# Patient Record
Sex: Male | Born: 1940 | Race: White | Hispanic: No | Marital: Married | Smoking: Current every day smoker
Health system: Southern US, Community
[De-identification: ages and names within clinical notes are randomized; demographics above are authoritative.]

## PROBLEM LIST (undated history)

## (undated) DIAGNOSIS — D126 Benign neoplasm of colon, unspecified: Secondary | ICD-10-CM

## (undated) DIAGNOSIS — R0602 Shortness of breath: Secondary | ICD-10-CM

## (undated) DIAGNOSIS — I2510 Atherosclerotic heart disease of native coronary artery without angina pectoris: Secondary | ICD-10-CM

## (undated) DIAGNOSIS — K449 Diaphragmatic hernia without obstruction or gangrene: Secondary | ICD-10-CM

## (undated) DIAGNOSIS — K219 Gastro-esophageal reflux disease without esophagitis: Secondary | ICD-10-CM

## (undated) DIAGNOSIS — G4730 Sleep apnea, unspecified: Secondary | ICD-10-CM

## (undated) DIAGNOSIS — R972 Elevated prostate specific antigen [PSA]: Secondary | ICD-10-CM

## (undated) DIAGNOSIS — I739 Peripheral vascular disease, unspecified: Secondary | ICD-10-CM

## (undated) DIAGNOSIS — Z8711 Personal history of peptic ulcer disease: Secondary | ICD-10-CM

## (undated) DIAGNOSIS — E669 Obesity, unspecified: Secondary | ICD-10-CM

## (undated) DIAGNOSIS — I10 Essential (primary) hypertension: Secondary | ICD-10-CM

## (undated) DIAGNOSIS — J449 Chronic obstructive pulmonary disease, unspecified: Secondary | ICD-10-CM

## (undated) DIAGNOSIS — I499 Cardiac arrhythmia, unspecified: Secondary | ICD-10-CM

## (undated) DIAGNOSIS — N12 Tubulo-interstitial nephritis, not specified as acute or chronic: Secondary | ICD-10-CM

## (undated) DIAGNOSIS — E785 Hyperlipidemia, unspecified: Secondary | ICD-10-CM

## (undated) DIAGNOSIS — J9610 Chronic respiratory failure, unspecified whether with hypoxia or hypercapnia: Secondary | ICD-10-CM

## (undated) DIAGNOSIS — I4891 Unspecified atrial fibrillation: Secondary | ICD-10-CM

## (undated) DIAGNOSIS — F17200 Nicotine dependence, unspecified, uncomplicated: Secondary | ICD-10-CM

## (undated) DIAGNOSIS — I5022 Chronic systolic (congestive) heart failure: Secondary | ICD-10-CM

## (undated) HISTORY — DX: Obesity, unspecified: E66.9

## (undated) HISTORY — PX: APPENDECTOMY: SHX54

## (undated) HISTORY — DX: Essential (primary) hypertension: I10

## (undated) HISTORY — DX: Benign neoplasm of colon, unspecified: D12.6

## (undated) HISTORY — DX: Elevated prostate specific antigen (PSA): R97.20

## (undated) HISTORY — DX: Diaphragmatic hernia without obstruction or gangrene: K44.9

## (undated) HISTORY — DX: Unspecified atrial fibrillation: I48.91

## (undated) HISTORY — PX: CORONARY ANGIOPLASTY: SHX604

## (undated) HISTORY — DX: Sleep apnea, unspecified: G47.30

## (undated) HISTORY — PX: TONSILLECTOMY: SUR1361

## (undated) HISTORY — DX: Nicotine dependence, unspecified, uncomplicated: F17.200

## (undated) HISTORY — DX: Tubulo-interstitial nephritis, not specified as acute or chronic: N12

## (undated) HISTORY — DX: Hyperlipidemia, unspecified: E78.5

## (undated) HISTORY — DX: Personal history of peptic ulcer disease: Z87.11

## (undated) HISTORY — DX: Peripheral vascular disease, unspecified: I73.9

## (undated) HISTORY — DX: Gastro-esophageal reflux disease without esophagitis: K21.9

## (undated) HISTORY — DX: Atherosclerotic heart disease of native coronary artery without angina pectoris: I25.10

## (undated) SURGERY — PARS PLANA VITRECTOMY 25 GAUGE FOR ENDOPHTHALMITIS
Anesthesia: Monitor Anesthesia Care | Laterality: Right

---

## 2001-01-31 HISTORY — PX: OTHER SURGICAL HISTORY: SHX169

## 2001-05-08 ENCOUNTER — Inpatient Hospital Stay (HOSPITAL_COMMUNITY): Admission: RE | Admit: 2001-05-08 | Discharge: 2001-05-11 | Payer: Self-pay | Admitting: Cardiology

## 2001-05-08 ENCOUNTER — Encounter: Payer: Self-pay | Admitting: Cardiology

## 2001-05-22 ENCOUNTER — Encounter (HOSPITAL_COMMUNITY): Admission: RE | Admit: 2001-05-22 | Discharge: 2001-08-20 | Payer: Self-pay | Admitting: Cardiology

## 2001-08-21 ENCOUNTER — Encounter (HOSPITAL_COMMUNITY): Admission: RE | Admit: 2001-08-21 | Discharge: 2001-08-29 | Payer: Self-pay | Admitting: Cardiology

## 2001-12-31 ENCOUNTER — Ambulatory Visit: Admission: RE | Admit: 2001-12-31 | Discharge: 2001-12-31 | Payer: Self-pay | Admitting: Internal Medicine

## 2002-07-02 ENCOUNTER — Ambulatory Visit (HOSPITAL_COMMUNITY): Admission: RE | Admit: 2002-07-02 | Discharge: 2002-07-02 | Payer: Self-pay | Admitting: *Deleted

## 2002-07-02 ENCOUNTER — Encounter (INDEPENDENT_AMBULATORY_CARE_PROVIDER_SITE_OTHER): Payer: Self-pay | Admitting: Specialist

## 2002-09-09 ENCOUNTER — Ambulatory Visit (HOSPITAL_COMMUNITY): Admission: RE | Admit: 2002-09-09 | Discharge: 2002-09-09 | Payer: Self-pay | Admitting: Cardiology

## 2004-11-11 ENCOUNTER — Encounter (HOSPITAL_COMMUNITY): Admission: RE | Admit: 2004-11-11 | Discharge: 2005-02-09 | Payer: Self-pay | Admitting: Cardiology

## 2005-02-10 ENCOUNTER — Encounter (HOSPITAL_COMMUNITY): Admission: RE | Admit: 2005-02-10 | Discharge: 2005-05-11 | Payer: Self-pay | Admitting: Cardiology

## 2008-05-08 ENCOUNTER — Encounter: Payer: Self-pay | Admitting: Pulmonary Disease

## 2009-01-26 ENCOUNTER — Encounter: Payer: Self-pay | Admitting: Pulmonary Disease

## 2009-06-09 ENCOUNTER — Encounter: Payer: Self-pay | Admitting: Pulmonary Disease

## 2009-07-01 DIAGNOSIS — I2510 Atherosclerotic heart disease of native coronary artery without angina pectoris: Secondary | ICD-10-CM

## 2009-07-01 DIAGNOSIS — D126 Benign neoplasm of colon, unspecified: Secondary | ICD-10-CM

## 2009-07-01 DIAGNOSIS — F17200 Nicotine dependence, unspecified, uncomplicated: Secondary | ICD-10-CM

## 2009-07-01 DIAGNOSIS — I10 Essential (primary) hypertension: Secondary | ICD-10-CM | POA: Insufficient documentation

## 2009-07-01 DIAGNOSIS — N12 Tubulo-interstitial nephritis, not specified as acute or chronic: Secondary | ICD-10-CM | POA: Insufficient documentation

## 2009-07-01 DIAGNOSIS — K449 Diaphragmatic hernia without obstruction or gangrene: Secondary | ICD-10-CM | POA: Insufficient documentation

## 2009-07-01 DIAGNOSIS — Z8711 Personal history of peptic ulcer disease: Secondary | ICD-10-CM

## 2009-07-01 DIAGNOSIS — E785 Hyperlipidemia, unspecified: Secondary | ICD-10-CM

## 2009-07-01 DIAGNOSIS — G4733 Obstructive sleep apnea (adult) (pediatric): Secondary | ICD-10-CM | POA: Insufficient documentation

## 2009-07-01 DIAGNOSIS — N529 Male erectile dysfunction, unspecified: Secondary | ICD-10-CM

## 2009-07-01 DIAGNOSIS — R972 Elevated prostate specific antigen [PSA]: Secondary | ICD-10-CM | POA: Insufficient documentation

## 2009-07-01 DIAGNOSIS — E669 Obesity, unspecified: Secondary | ICD-10-CM

## 2009-07-01 DIAGNOSIS — K219 Gastro-esophageal reflux disease without esophagitis: Secondary | ICD-10-CM

## 2009-07-02 ENCOUNTER — Ambulatory Visit: Payer: Self-pay | Admitting: Pulmonary Disease

## 2009-07-02 DIAGNOSIS — J449 Chronic obstructive pulmonary disease, unspecified: Secondary | ICD-10-CM

## 2009-10-21 ENCOUNTER — Encounter: Admission: RE | Admit: 2009-10-21 | Discharge: 2009-10-21 | Payer: Self-pay | Admitting: Cardiovascular Disease

## 2009-10-23 ENCOUNTER — Ambulatory Visit (HOSPITAL_COMMUNITY): Admission: RE | Admit: 2009-10-23 | Discharge: 2009-10-24 | Payer: Self-pay | Admitting: Cardiovascular Disease

## 2009-10-26 ENCOUNTER — Ambulatory Visit: Payer: Self-pay | Admitting: Pulmonary Disease

## 2010-03-04 NOTE — Letter (Signed)
Summary: Romero Liner MD  Romero Liner MD   Imported By: Lester Dufur 07/13/2009 08:32:40  _____________________________________________________________________  External Attachment:    Type:   Image     Comment:   External Document

## 2010-03-04 NOTE — Assessment & Plan Note (Signed)
Summary: rov for emphysema   Copy to:  Dr. Merri Brunette Primary Provider/Referring Provider:  Dr. Merri Brunette  CC:  4 month follow up. Pt states his breathing is better since last visit. Pt c/o productive cough. 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 Pt states he is still smoking 3/4 of a pack. .  History of Present Illness: the pt comes in today for f/u of his known emphysema.  He thinks he has done better on his bronchodilator regimen, but has improved further after his recent PCI for CAD.  Unfortunately, he continues to smoke, but is trying to quit.  He denies any recent acute exacerbation or pulmonary infection.  He is scheduled to get flu shot with primary md next week.  Preventive Screening-Counseling & Management  Alcohol-Tobacco     Smoking Status: current     Smoking Cessation Counseling: yes     Packs/Day: 0.75  Current Medications (verified): 1)  Aspirin 325 Mg Tabs (Aspirin) .... Once Daily 2)  Simvastatin 40 Mg Tabs (Simvastatin) .... Take 1 Tablet By Mouth Once A Day 3)  Protonix 40 Mg Tbec (Pantoprazole Sodium) .... Take 1 Tablet By Mouth Once A Day 4)  Symbicort 80-4.5 Mcg/act Aero (Budesonide-Formoterol Fumarate) .... As Needed 5)  Spiriva Handihaler 18 Mcg  Caps (Tiotropium Bromide Monohydrate) .... Inhale Contents of 1 Capsule Once A Day 6)  Multivitamins  Tabs (Multiple Vitamin) .... Take 1 Tablet By Mouth Once A Day 7)  Fish Oil 1000 Mg Caps (Omega-3 Fatty Acids) .... Three Times A Day 8)  Vitamin D .... Take 1 Tablet By Mouth Once A Day 9)  Citracal .... Daily 10)  Losartan Potassium 100 Mg Tabs (Losartan Potassium) .... Once Daily 11)  Proair Hfa 108 (90 Base) Mcg/act  Aers (Albuterol Sulfate) .... 2 Puffs Every 4-6 Hours As Needed 12)  Bystolic 5 Mg Tabs (Nebivolol Hcl) .... One Tablet Once Daily 13)  Plavix 75 Mg Tabs (Clopidogrel Bisulfate) .... One Tablet Once Daily 14)  Diltiazem Hcl 120 Mg Tabs (Diltiazem Hcl) .... Once Daily 15)   Nitrostat 0.4 Mg Subl (Nitroglycerin) .... As Directed As Needed  Allergies: 1)  ! Zantac 2)  ! Ticlid 3)  ! Sulfa 4)  ! Beta Blockers 5)  ! * Altace 6)  ! * Ranitidine Hcl  Social History: Smoking Status:  current Packs/Day:  0.75  Review of Systems       The patient complains of shortness of breath with activity, productive cough, irregular heartbeats, acid heartburn, and hand/feet swelling.  The patient denies shortness of breath at rest, non-productive cough, coughing up blood, chest pain, loss of appetite, weight change, abdominal pain, difficulty swallowing, sore throat, tooth/dental problems, headaches, nasal congestion/difficulty breathing through nose, sneezing, itching, ear ache, anxiety, depression, joint stiffness or pain, rash, change in color of mucus, and fever.    Vital Signs:  Patient profile:   70 year old male Height:      71 inches O2 Sat:      92 % on Room air Temp:     97.4 degrees F oral Pulse rate:   81 / minute BP sitting:   112 / 62  (left arm) Cuff size:   regular  Vitals Entered By: Carver Fila (October 26, 2009 9:07 AM)  O2 Flow:  Room air CC: 4 month follow up. Pt states his breathing is better since last visit. Pt c/o productive cough. Pt states he was told he has  an aneurysm  on his aorta. Pt states he is still smoking 3/4 of a pack.  Comments meds and allergies updated Phone number updated Carver Fila  October 26, 2009 9:06 AM    Physical Exam  General:  ow male in nad Lungs:  decreased bs, no wheezing or rhonchi Heart:  rrr, no mrg Extremities:  no edema or cyanosis  Neurologic:  alert and oriented, moves all 4.   Impression & Recommendations:  Problem # 1:  EMPHYSEMA (ICD-492.8)  the pt is doing better on his bronchodilator regimen, but unfortunately continues to smoke.  I have encouraged him to work on smoking cessation, and to also work on weight loss and conditioning.  He is to continue with his current bronchodilator  regimen, and will followup with me in 6mos.  The pt states that he is getting his flu shot next week with primary md.  Have given him a prescription for the nicotine patch, and also a flyer for the smoking cessation class given at [facility].  Medications Added to Medication List This Visit: 1)  Aspirin 325 Mg Tabs (Aspirin) .... Once daily 2)  Bystolic 5 Mg Tabs (Nebivolol hcl) .... One tablet once daily 3)  Plavix 75 Mg Tabs (Clopidogrel bisulfate) .... One tablet once daily 4)  Diltiazem Hcl 120 Mg Tabs (Diltiazem hcl) .... Once daily 5)  Nitrostat 0.4 Mg Subl (Nitroglycerin) .... As directed as needed 6)  Nicotine 14 Mg/24hr Pt24 (Nicotine) .... One locally each day  Other Orders: Est. Patient Level III (16109) Tobacco use cessation intermediate 3-10 minutes (60454)  Patient Instructions: 1)  no change in meds 2)  work on exercise program and weight loss 3)  stop smoking 4)  followup with me in 6mos.  Prescriptions: NICOTINE 14 MG/24HR PT24 (NICOTINE) one locally each day  #one box x 6   Entered and Authorized by:   Barbaraann Share MD   Signed by:   Barbaraann Share MD on 10/26/2009   Method used:   Print then Give to Patient   RxID:   [id number]    Immunization History:  Influenza Immunization History:    Influenza:  historical (10/31/2008)

## 2010-03-04 NOTE — Assessment & Plan Note (Signed)
Summary: consult for emphysema   Copy to:  Dr. Merri Brunette Primary Provider/Referring Provider:  Dr. Merri Brunette  CC:  PUlmonary Consult for COPD.Marland Kitchen  History of Present Illness: The pt is a 70y/o male who I have been asked to see for emphysema.  He has a longstanding h/o smoking, and continues to do so at a pack a day.  He has had spirometry in April of last year which revealed an FEV1% of 36, and FEV1 of 0.95 (27% pred).  He did have a 22% improvement in FEV1 with bronchodilators at the time.  The pt has been treated with spiriva and symbicort, and thinks they have helped his breathing.  He has had breathing issues for at least 6 yrs, and feels they are getting worse.  He describes a one block doe at a moderate pace on flat ground, and will get winded bringing groceries in from the car.  He has a congested cough at times, with varying severity.  He typically produces white to clear mucus.  He does have a h/o coronary artery disease, with a stent placed in 2003.  He apparently had a stress test last year, and tells me it was "ok".  He denies any siginficant LE edema.  Current Medications (verified): 1)  Bisoprolol Fumarate 5 Mg Tabs (Bisoprolol Fumarate) .... Take 1 Tablet By Mouth Once A Day 2)  Aspirin Low Dose 81 Mg Tabs (Aspirin) .... Take 1 Tablet By Mouth Once A Day 3)  Simvastatin 40 Mg Tabs (Simvastatin) .... Take 1 Tablet By Mouth Once A Day 4)  Protonix 40 Mg Tbec (Pantoprazole Sodium) .... Take 1 Tablet By Mouth Once A Day 5)  Symbicort 80-4.5 Mcg/act Aero (Budesonide-Formoterol Fumarate) .... As Needed 6)  Spiriva Handihaler 18 Mcg  Caps (Tiotropium Bromide Monohydrate) .... Inhale Contents of 1 Capsule Once A Day 7)  Combivent 18-103 Mcg/act Aero (Ipratropium-Albuterol) .... As Needed 8)  Multivitamins  Tabs (Multiple Vitamin) .... Take 1 Tablet By Mouth Once A Day 9)  Fish Oil 1000 Mg Caps (Omega-3 Fatty Acids) .... Three Times A Day 10)  Vitamin D .... Take 1 Tablet By Mouth Once  A Day 11)  Citracal .... Daily 12)  Losartan Potassium 100 Mg Tabs (Losartan Potassium) .... Once Daily  Allergies (verified): 1)  ! Zantac 2)  ! Ticlid 3)  ! Sulfa 4)  ! Beta Blockers 5)  ! 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 heart disease: mother (heart failure d/t rheumatic heart disease) father ( CAD) emphysema-father  Social History: Reviewed history from 07/01/2009 and no changes required. Pt is retired.  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 1ppd x 60yrs couple drinks a day married 2 children  Review of Systems       The patient complains of shortness of breath with activity, shortness of breath at rest, productive cough, irregular heartbeats, and tooth/dental problems.  The patient denies non-productive cough, coughing up blood, chest pain, acid heartburn, indigestion, loss of appetite, weight change, abdominal pain, difficulty swallowing, sore throat, headaches, nasal congestion/difficulty breathing through nose, sneezing, itching, ear ache,  anxiety, depression, hand/feet swelling, joint stiffness or pain, rash, change in color of mucus, and fever.    Vital Signs:  Patient profile:   70 year old male Height:      71 inches Weight:      221 pounds BMI:     30.93 O2 Sat:      95 % on Room air Temp:     97.7 degrees F oral Pulse rate:   74 / minute BP sitting:   120 / 66  (left arm) Cuff size:    regular  Vitals Entered By: Gweneth Dimitri RN (July 02, 2009 2:21 PM)  O2 Flow:  Room air CC: PUlmonary Consult for COPD. Comments Medications reviewed with patient Daytime contact number verified with patient. Gweneth Dimitri RN  July 02, 2009 2:21 PM    Physical Exam  General:  ow male in nad Eyes:  PERRLA and EOMI.   Nose:  deviated septum to left with significant narrowing Mouth:  clear Neck:  no jvd, tmg, LN Lungs:  decreased bs throughout, no wheezing or rhonchi Heart:  rrr, no mrg Abdomen:  soft and nontender, bs + Extremities:  mild edema in ankles, no cyanosis varicosities present pulses intact distally. Neurologic:  alert and oriented, moves all 4.   Impression & Recommendations:  Problem # 1:  EMPHYSEMA (ICD-492.8)  the pt has severe airflow obstruction by his spirometry last year, and unfortunately continues to smoke.  I think he does have fairly severe emphysema, and have told him that it is crucial that he stop smoking before he reaches a critical FEV1 where he becomes nonfunctional.  I suspect he is close.  He is on an excellent bronchodilator regimen, and vaccines are up to date.  He is not quite at the point of needing oxygen, but may be nearing that point.  I have discussed the role of weight loss and some type of conditioning program such as pulmonary rehab.  He will consider this vs. a program closer to his home.  Ultimately, his fate is in his own hands, and depends on smoking cessation.  He will continue to have cough and excess mucus production until he stops smoking.  Medications Added to Medication List This Visit: 1)  Symbicort 80-4.5 Mcg/act Aero (Budesonide-formoterol fumarate) .... As needed 2)  Combivent 18-103 Mcg/act Aero (Ipratropium-albuterol) .... As needed 3)  Fish Oil 1000 Mg Caps (Omega-3 fatty acids) .... Three times a day 4)  Losartan Potassium 100 Mg Tabs (Losartan potassium) .... Once daily 5)  Proair Hfa 108 (90 Base) Mcg/act Aers  (Albuterol sulfate) .... 2 puffs every 4-6 hours as needed  Other Orders: Tobacco use cessation intermediate 3-10 minutes (91478) Consultation Level IV (29562)  Patient Instructions: 1)  stop smoking!!  this is the most important thing to treat your lung problem. 2)  work on weight loss, conditioning program.  Please call if you wish to be referred to pulmonary rehab program. 3)  continue with symbicort and spiriva. 4)  change combivent to albuterol 2 puffs every 4-6 hours only if needed for emergencies. 5)  followup with me in 4mos so we can check on your progress.   Prescriptions: PROAIR HFA 108 (90 BASE) MCG/ACT  AERS (ALBUTEROL SULFATE) 2 puffs every 4-6 hours as needed  #1 x 6   Entered and Authorized by:   Barbaraann Share MD   Signed by:   Barbaraann Share MD on 07/02/2009   Method used:   Electronically to  [facility] [address] [id number] (retail)       [address]       [place], [place]  [address]       Ph: [phone]       Fax: [phone]   RxID:   [id number]

## 2010-03-27 ENCOUNTER — Encounter (HOSPITAL_COMMUNITY): Payer: Self-pay | Admitting: Radiology

## 2010-03-27 ENCOUNTER — Other Ambulatory Visit: Payer: Self-pay | Admitting: Surgery

## 2010-03-27 ENCOUNTER — Inpatient Hospital Stay (HOSPITAL_COMMUNITY)
Admission: EM | Admit: 2010-03-27 | Discharge: 2010-03-29 | DRG: 340 | Disposition: A | Discharge status: Home / Self Care | Payer: Medicare Other | Attending: Surgery | Admitting: Surgery

## 2010-03-27 ENCOUNTER — Emergency Department (HOSPITAL_COMMUNITY): Payer: Medicare Other

## 2010-03-27 DIAGNOSIS — Z9861 Coronary angioplasty status: Secondary | ICD-10-CM

## 2010-03-27 DIAGNOSIS — Z79899 Other long term (current) drug therapy: Secondary | ICD-10-CM

## 2010-03-27 DIAGNOSIS — Z7902 Long term (current) use of antithrombotics/antiplatelets: Secondary | ICD-10-CM

## 2010-03-27 DIAGNOSIS — K35209 Acute appendicitis with generalized peritonitis, without abscess, unspecified as to perforation: Principal | ICD-10-CM | POA: Diagnosis present

## 2010-03-27 DIAGNOSIS — I2510 Atherosclerotic heart disease of native coronary artery without angina pectoris: Secondary | ICD-10-CM | POA: Diagnosis present

## 2010-03-27 DIAGNOSIS — J449 Chronic obstructive pulmonary disease, unspecified: Secondary | ICD-10-CM | POA: Diagnosis present

## 2010-03-27 DIAGNOSIS — Z7982 Long term (current) use of aspirin: Secondary | ICD-10-CM

## 2010-03-27 DIAGNOSIS — K352 Acute appendicitis with generalized peritonitis, without abscess: Principal | ICD-10-CM | POA: Diagnosis present

## 2010-03-27 DIAGNOSIS — E669 Obesity, unspecified: Secondary | ICD-10-CM | POA: Diagnosis present

## 2010-03-27 DIAGNOSIS — G4733 Obstructive sleep apnea (adult) (pediatric): Secondary | ICD-10-CM | POA: Diagnosis present

## 2010-03-27 DIAGNOSIS — I10 Essential (primary) hypertension: Secondary | ICD-10-CM | POA: Diagnosis present

## 2010-03-27 DIAGNOSIS — J4489 Other specified chronic obstructive pulmonary disease: Secondary | ICD-10-CM | POA: Diagnosis present

## 2010-03-27 DIAGNOSIS — F17200 Nicotine dependence, unspecified, uncomplicated: Secondary | ICD-10-CM | POA: Diagnosis present

## 2010-03-27 HISTORY — DX: Nicotine dependence, unspecified, uncomplicated: F17.200

## 2010-03-27 HISTORY — DX: Chronic obstructive pulmonary disease, unspecified: J44.9

## 2010-03-27 LAB — COMPREHENSIVE METABOLIC PANEL
ALT: 16 U/L (ref 0–53)
Alkaline Phosphatase: 60 U/L (ref 39–117)
CO2: 30 mEq/L (ref 19–32)
Chloride: 101 mEq/L (ref 96–112)
GFR calc non Af Amer: 45 mL/min — ABNORMAL LOW (ref >60)
Glucose, Bld: 140 mg/dL — ABNORMAL HIGH (ref 70–99)
Potassium: 4.9 mEq/L (ref 3.5–5.1)
Sodium: 139 mEq/L (ref 135–145)

## 2010-03-27 LAB — CBC
HCT: 44.3 % (ref 39.0–52.0)
Hemoglobin: 14.9 g/dL (ref 13.0–17.0)
MCH: 29.1 pg (ref 26.0–34.0)
MCV: 86.5 fL (ref 78.0–100.0)
RBC: 5.12 MIL/uL (ref 4.22–5.81)

## 2010-03-27 LAB — DIFFERENTIAL
Basophils Absolute: 0 10*3/uL (ref 0.0–0.1)
Basophils Relative: 0 % (ref 0–1)
Lymphocytes Relative: 6 % — ABNORMAL LOW (ref 12–46)
Lymphs Abs: 0.9 10*3/uL (ref 0.7–4.0)
Monocytes Absolute: 1.1 10*3/uL — ABNORMAL HIGH (ref 0.1–1.0)
Monocytes Relative: 7 % (ref 3–12)
Neutro Abs: 13.8 10*3/uL — ABNORMAL HIGH (ref 1.7–7.7)
Neutrophils Relative %: 87 % — ABNORMAL HIGH (ref 43–77)

## 2010-03-27 LAB — TYPE AND SCREEN: ABO/RH(D): O POS

## 2010-03-27 MED ORDER — IOHEXOL 300 MG/ML  SOLN
100.0000 mL | Freq: Once | INTRAMUSCULAR | Status: AC | PRN
  Administered 2010-03-27: 100 mL via INTRAVENOUS

## 2010-03-28 LAB — BASIC METABOLIC PANEL
BUN: 18 mg/dL (ref 6–23)
CO2: 28 mEq/L (ref 19–32)
Calcium: 8.6 mg/dL (ref 8.4–10.5)
GFR calc non Af Amer: 39 mL/min — ABNORMAL LOW (ref >60)
Glucose, Bld: 160 mg/dL — ABNORMAL HIGH (ref 70–99)
Potassium: 4.6 mEq/L (ref 3.5–5.1)

## 2010-03-28 LAB — CBC
HCT: 44.3 % (ref 39.0–52.0)
Hemoglobin: 14.2 g/dL (ref 13.0–17.0)
MCHC: 32.1 g/dL (ref 30.0–36.0)
MCV: 88.4 fL (ref 78.0–100.0)
RDW: 14.8 % (ref 11.5–15.5)

## 2010-03-29 LAB — BASIC METABOLIC PANEL
BUN: 23 mg/dL (ref 6–23)
Chloride: 102 mEq/L (ref 96–112)
Creatinine, Ser: 1.72 mg/dL — ABNORMAL HIGH (ref 0.4–1.5)
Glucose, Bld: 98 mg/dL (ref 70–99)

## 2010-03-29 LAB — CBC
MCHC: 31.8 g/dL (ref 30.0–36.0)
MCV: 86.8 fL (ref 78.0–100.0)
Platelets: 123 10*3/uL — ABNORMAL LOW (ref 150–400)
RDW: 14.5 % (ref 11.5–15.5)
WBC: 10.7 10*3/uL — ABNORMAL HIGH (ref 4.0–10.5)

## 2010-03-30 NOTE — H&P (Signed)
[name], [name]                ACCOUNT NO.:  [id number]  MEDICAL RECORD NO.:  [id number]           PATIENT TYPE:  I  LOCATION:  3706                         FACILITY:  [facility]  PHYSICIAN:  Abigail Miyamoto, M.D. DATE OF BIRTH:  [birth year]  DATE OF ADMISSION:  03/27/2010 DATE OF DISCHARGE:                             HISTORY & PHYSICAL   CHIEF COMPLAINTS:  Right side abdominal pain.  HISTORY:  This is a 70 year old gentleman who presents with a 24-hour history of right-sided and mid sharp abdominal pain.  He reports the pain is now continuous, it began gradually but now it is rated as an 8- 9/10.  He has had nausea with this but no emesis.  He has had no similar history of abdominal pain.  His pain did not refer anywhere else. Again, it is sharp consistently.  PAST MEDICAL HISTORY: 1. Coronary artery disease status post heart catheterization and     stents. 2. COPD. 3. Hypertension. 4. Sleep apnea. 5. Tobacco abuse.  FAMILY HISTORY:  Noncontributory.  PAST SURGICAL HISTORY:  Tonsillectomy and above-mentioned heart catheterization.  MEDICATIONS:  Pantoprazole, losartan, isosorbide, Plavix, Bystolic, nitroglycerin, diltiazem, aspirin, Symbicort, Spiriva, Crestor, and recent Cipro.  SOCIAL HISTORY:  He smokes a pack of cigarettes a day.  He drinks alcohol on occasion.  REVIEW OF SYSTEMS:  GENERAL:  Negative for fever or chills.  PULMONARY: Negative for cough, shortness of breath, or difficulty breathing. CARDIAC:  Negative for chest pain or irregular heartbeat.  ABDOMEN: Listed as above.  Bowel movements have been normal.  URINARY:  Negative for dysuria, hematuria. The rest of review of systems including skin, eyes, ear, nose, and throat, musculoskeletal, neurologic, psychiatric, endocrine are normal.  ALLERGIES:  SULFA and ZANTAC.  PHYSICAL EXAMINATION:  GENERAL:  This is a mildly obese gentleman, in no acute distress. VITAL SIGNS:  Temperature 98.8, heart rate  76, blood pressure is 123/55, respiratory rate 22, satting 100% on room air. HEENT:  Eyes:  Anicteric.  Pupils reactive bilaterally.  ENT:  External ears and nose normal.  Hearing is normal.  Oropharynx clear. NECK:  Supple.  There is no cervical adenopathy.  There is no thyromegaly. LUNGS:  Clear to auscultation bilaterally with normal respiratory effort. CARDIOVASCULAR:  Regular rate and rhythm.  There are no murmurs.  There is no peripheral edema. ABDOMEN:  Soft.  There is tenderness with guarding in the mid to right upper quadrant and periumbilical area, tenderness is moderate.  There are no masses.  There is no hernias.  There is no organomegaly. EXTREMITIES:  Warm, well perfused.  No edema, clubbing, or cyanosis. Peripheral pulses are intact in all four extremities. MUSCULOSKELETAL:  Normal motor function and strength in all four extremities. NEUROLOGIC:  Shows him to have gross motor and sensory function in all four extremities. SKIN:  No rashes and no jaundice. PSYCHIATRIC:  Shows him to be awake, alert, and oriented.  Judgment, affect appeared normal.  LABORATORY DATA:  The patient's CBC showed a white blood count of 15.8, hemoglobin of 14.9, platelets of 153, creatinine is 1.55.  The patient had a CAT scan of the abdomen  and pelvis that shows dilated appendix with periappendiceal stranding and a possible periappendiceal abscess that is near the right upper quadrant with the cecum in the right upper quadrant as well.  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  IMPRESSION:  This is a patient with acute appendicitis and a history of coronary artery disease.  At this point, we will proceed to the operating room for a laparoscopic possible open appendectomy.  He understands the risks of surgery, which include but not limited to, bleeding, infection, injury to surrounding structures, appendiceal stump leak, need for further surgery, ongoing infection  and cardiac and pulmonary issues given his cardiac history and smoking.  He understands and wishes to proceed.  Surgery is thus scheduled urgently.     Abigail Miyamoto, M.D.     DB/MEDQ  D:  03/27/2010  T:  03/27/2010  Job:  [id number]  Electronically Signed by Abigail Miyamoto M.D. on 03/30/2010 03:10:40 PM

## 2010-03-30 NOTE — Op Note (Signed)
NAMEHOBSON, LAX                ACCOUNT NO.:  [id number]  MEDICAL RECORD NO.:  [id number]           PATIENT TYPE:  I  LOCATION:  3706                         FACILITY:  [facility]  PHYSICIAN:  Abigail Miyamoto, M.D. DATE OF BIRTH:  August 20, 1940  DATE OF PROCEDURE:  03/27/2010 DATE OF DISCHARGE:                              OPERATIVE REPORT   PREOPERATIVE DIAGNOSIS:  Acute appendicitis.  POSTOPERATIVE DIAGNOSIS:  Perforated appendicitis.  PROCEDURE:  Laparoscopic appendectomy.  SURGEON:  Abigail Miyamoto, MD  ANESTHESIA:  General and 0.25% Marcaine with epinephrine.  ESTIMATED BLOOD LOSS:  Minimal.  INDICATIONS:  [name] is a 70 year old gentleman who presents with right-sided abdominal pain.  He was found on CAT scan to have findings consistent with acute appendicitis.  The decision was made to proceed to the operating room.  FINDINGS:  The patient was found to have acute appendicitis with perforation near the mid section of the appendix.  PROCEDURE IN DETAIL:  The patient was brought to the operating room, identified as [name].  He was placed supine on the operating room table and general anesthesia was induced.  His abdomen was then prepped and draped in usual sterile fashion.  Using a #15 blade, a small vertical incision was made above the umbilicus.  This was carried down to the fascia which was then opened with a scalpel.  A hemostat was then used to pass to the peritoneal cavity under direct vision.  Next, a 0 Vicryl pursestring suture was placed around the fascial opening.  The Hasson port was placed through the opening and insufflation of the abdomen was begun.  The patient's cecum was found to be in the right mid quadrant.  I placed a 5-mm port in the patient's right upper quadrant and another in the left lower quadrant under direct vision.  The patient had a moderate amount of turbid fluid.  The appendix was quite stuck underneath the terminal ileum.  I  was able to finally elevate it, and there was an area of perforation of the midportion of the appendix.  I was able to identify the base of the appendix.  I had to change the 5-mm port in the right side of the abdomen to a 12-mm port under direct vision in order to facilitate the stapling device.  I was then able to transect the base of the appendix.  I then took down the mesoappendix with the Harmonic scalpel.  Once the appendage was completely removed, I placed it in an endosac and removed the incision at the umbilicus.  The patient had a large amount of inflammatory process underneath the medial portion of the cecum below the terminal ileum where the appendix had bee,  I thoroughly irrigated this area with normal saline.  Hemostasis appeared to be achieved.  I did place a piece of Surgicel material in this area as the patient was on Plavix and aspirin.  Hemostasis, however, appeared to be achieved.  At this point, I irrigated the abdomen with 2 L of normal saline.  I then made a separate skin incision in the patient's right lower quadrant and brought a  5-mm port through here.  I then placed a 19-French Blake drain through the 12-mm port and pulled it out of the right lower quadrant port site.  I placed the drain in the area of the cecum and terminal ileum.  The drain was sewn in place with a nylon suture.  Hemostasis again appeared to be achieved. All ports were removed under direct vision, and the abdomen was deflated.  All incisions were then anesthetized with Marcaine.  I closed the fascia at the 12-mm port site with a figure-of-eight 0 Vicryl suture as well.  All skin incisions were then closed with 4-0 Monocryl.  Steri- Strips and Band-Aids were then applied.  The patient tolerated the procedure well.  All counts were correct at the end of the procedure. The patient was then extubated in the operating room and taken in stable condition to recovery room.     Abigail Miyamoto,  M.D.     DB/MEDQ  D:  03/27/2010  T:  03/27/2010  Job:  [id number]  Electronically Signed by Abigail Miyamoto M.D. on 03/30/2010 03:10:42 PM

## 2010-04-02 ENCOUNTER — Emergency Department (HOSPITAL_COMMUNITY): Payer: Medicare Other

## 2010-04-02 ENCOUNTER — Inpatient Hospital Stay (HOSPITAL_COMMUNITY)
Admission: EM | Admit: 2010-04-02 | Discharge: 2010-04-06 | DRG: 394 | Disposition: A | Discharge status: Home / Self Care | Payer: Medicare Other | Attending: Surgery | Admitting: Surgery

## 2010-04-02 DIAGNOSIS — J4489 Other specified chronic obstructive pulmonary disease: Secondary | ICD-10-CM | POA: Diagnosis present

## 2010-04-02 DIAGNOSIS — Z79899 Other long term (current) drug therapy: Secondary | ICD-10-CM

## 2010-04-02 DIAGNOSIS — Y836 Removal of other organ (partial) (total) as the cause of abnormal reaction of the patient, or of later complication, without mention of misadventure at the time of the procedure: Secondary | ICD-10-CM | POA: Diagnosis present

## 2010-04-02 DIAGNOSIS — I10 Essential (primary) hypertension: Secondary | ICD-10-CM | POA: Diagnosis present

## 2010-04-02 DIAGNOSIS — Z7902 Long term (current) use of antithrombotics/antiplatelets: Secondary | ICD-10-CM

## 2010-04-02 DIAGNOSIS — N289 Disorder of kidney and ureter, unspecified: Secondary | ICD-10-CM | POA: Diagnosis present

## 2010-04-02 DIAGNOSIS — J449 Chronic obstructive pulmonary disease, unspecified: Secondary | ICD-10-CM | POA: Diagnosis present

## 2010-04-02 DIAGNOSIS — K56609 Unspecified intestinal obstruction, unspecified as to partial versus complete obstruction: Secondary | ICD-10-CM | POA: Diagnosis present

## 2010-04-02 DIAGNOSIS — Z7982 Long term (current) use of aspirin: Secondary | ICD-10-CM

## 2010-04-02 DIAGNOSIS — E860 Dehydration: Secondary | ICD-10-CM | POA: Diagnosis present

## 2010-04-02 DIAGNOSIS — E669 Obesity, unspecified: Secondary | ICD-10-CM | POA: Diagnosis present

## 2010-04-02 DIAGNOSIS — G4733 Obstructive sleep apnea (adult) (pediatric): Secondary | ICD-10-CM | POA: Diagnosis present

## 2010-04-02 DIAGNOSIS — K929 Disease of digestive system, unspecified: Principal | ICD-10-CM | POA: Diagnosis present

## 2010-04-02 DIAGNOSIS — I2510 Atherosclerotic heart disease of native coronary artery without angina pectoris: Secondary | ICD-10-CM | POA: Diagnosis present

## 2010-04-02 DIAGNOSIS — Z9861 Coronary angioplasty status: Secondary | ICD-10-CM

## 2010-04-02 LAB — COMPREHENSIVE METABOLIC PANEL
ALT: 11 U/L (ref 0–53)
AST: 22 U/L (ref 0–37)
Calcium: 9.5 mg/dL (ref 8.4–10.5)
Creatinine, Ser: 1.81 mg/dL — ABNORMAL HIGH (ref 0.4–1.5)
GFR calc Af Amer: 45 mL/min — ABNORMAL LOW (ref >60)
Sodium: 136 mEq/L (ref 135–145)
Total Protein: 6.9 g/dL (ref 6.0–8.3)

## 2010-04-02 LAB — URINALYSIS, ROUTINE W REFLEX MICROSCOPIC
Ketones, ur: 15 mg/dL — AB
Leukocytes, UA: NEGATIVE
Protein, ur: 100 mg/dL — AB
Urine Glucose, Fasting: NEGATIVE mg/dL

## 2010-04-02 LAB — DIFFERENTIAL
Basophils Absolute: 0.1 10*3/uL (ref 0.0–0.1)
Eosinophils Absolute: 0.1 10*3/uL (ref 0.0–0.7)
Lymphs Abs: 1.8 10*3/uL (ref 0.7–4.0)
Monocytes Absolute: 1.6 10*3/uL — ABNORMAL HIGH (ref 0.1–1.0)
Monocytes Relative: 11 % (ref 3–12)
Neutro Abs: 11.1 10*3/uL — ABNORMAL HIGH (ref 1.7–7.7)

## 2010-04-02 LAB — CBC
MCH: 28.4 pg (ref 26.0–34.0)
MCHC: 34.2 g/dL (ref 30.0–36.0)
Platelets: 267 10*3/uL (ref 150–400)
RDW: 14.3 % (ref 11.5–15.5)

## 2010-04-02 LAB — LIPASE, BLOOD: Lipase: 34 U/L (ref 11–59)

## 2010-04-02 LAB — URINE MICROSCOPIC-ADD ON

## 2010-04-02 MED ORDER — IOHEXOL 300 MG/ML  SOLN
80.0000 mL | Freq: Once | INTRAMUSCULAR | Status: AC | PRN
  Administered 2010-04-02: 80 mL via INTRAVENOUS

## 2010-04-02 NOTE — Discharge Summary (Signed)
NAMECAIRO, [name]                ACCOUNT NO.:  [id number]  MEDICAL RECORD NO.:  [id number]           PATIENT TYPE:  I  LOCATION:  3706                         FACILITY:  [facility]  PHYSICIAN:  Angelia Mould. Derrell Lolling, M.D.DATE OF BIRTH:  23-Jun-1940  DATE OF ADMISSION:  03/27/2010 DATE OF DISCHARGE:  03/29/2010                              DISCHARGE SUMMARY   CONSULTANTS:  None.  PROCEDURES:  Laparoscopic appendectomy with placement of JP drain by Dr. Magnus Ivan on March 27, 2010, this is Dr. Carman Ching.  REASON FOR ADMISSION:  [name] is a 70 year old male who presents to the emergency department with a 24-hour history of right-sided abdominal pain.  It has been progressively worsening over the last 24 hours.  Upon arrival, a CT scan was obtained, which revealed a dilated appendix with periappendiceal stranding and a possible periappendiceal abscess. Please see admitting history and physical for further details.  ADMITTING DIAGNOSES: 1. Acute appendicitis. 2. Coronary artery disease. 3. Chronic obstructive pulmonary disease. 4. Hypertension. 5. Obstructive sleep apnea.  HOSPITAL COURSE:  At this time, the patient was admitted and placed on IV Zosyn.  The patient was then taken to the operating room where he was found to have a perforated appendix.  This was able to be resected laparoscopically; however, due to the amount of contamination, a JP drain was left as well as the fact that the patient was on Plavix. Postoperatively, the patient did well.  On postoperative day #1 he was given clears, which he was tolerating well.  The following day his diet was advanced and he was tolerating a heart-healthy diet by postoperative day #2.  His abdomen was soft, appropriately tender with active bowel sounds.  His JP drain was serosanguineous in nature, but did have a slightly cloudy-looking appearance.  At this time, otherwise the patient was tolerating regular diet and felt stable for  discharge home with his JP drain.  DISCHARGE DIAGNOSES: 1. Perforated appendix status post laparoscopic appendectomy. 2. Coronary artery disease. 3. Hypertension. 4. Chronic obstructive pulmonary disease. 5. Obstructive sleep apnea.  DISCHARGE MEDICATIONS:  Please see medication reconciliation form.  DISCHARGE INSTRUCTIONS:  The patient may increase his activity slowly and walk up steps.  He may shower, however, he is not to bathe while his drain is in place for 2 weeks.  He is not to do any heavy lifting greater than 15 pounds for the next 2 weeks.  He is not to drive for the next 1 week.  He may resume a low-sodium heart-healthy diet.  He is to empty his JP drain as needed and record output and bring that with him to his followup doctor's appointment.  He is to call our office for fever greater than 101.5 or worsening abdominal pain.  We have recommended a stool softener and short-term MiraLax.  He is return to see Dr. Magnus Ivan in our office next week for drain removal.  I have called this appointment up; however, our computer system is down and it has been informed to tell the patient to call our office this afternoon for that appointment time.  He also needs to  follow up with Dr. Renne Crigler within the next 1-2 weeks for post hospital followup.  He did apparently have an appointment Dr. Retta Diones this morning that had to be cancelled due to his hospitalization and I have informed him he will need to reschedule that as well.     Letha Cape, PA   ______________________________ Angelia Mould. Derrell Lolling, M.D.    KEO/MEDQ  D:  03/29/2010  T:  03/29/2010  Job:  [id number]  cc:   Abigail Miyamoto, M.D. Soyla Murphy. Renne Crigler, M.D. Bertram Millard. Dahlstedt, M.D. Nicki Guadalajara, M.D.  Electronically Signed by Barnetta Chapel PA on 03/31/2010 02:06:19 PM Electronically Signed by Claud Kelp M.D. on 04/02/2010 09:34:24 AM

## 2010-04-03 ENCOUNTER — Inpatient Hospital Stay (HOSPITAL_COMMUNITY): Payer: Medicare Other

## 2010-04-03 LAB — CBC
MCV: 85.9 fL (ref 78.0–100.0)
Platelets: 232 10*3/uL (ref 150–400)
RBC: 4.9 MIL/uL (ref 4.22–5.81)
WBC: 11.8 10*3/uL — ABNORMAL HIGH (ref 4.0–10.5)

## 2010-04-03 LAB — COMPREHENSIVE METABOLIC PANEL
ALT: 12 U/L (ref 0–53)
AST: 15 U/L (ref 0–37)
Albumin: 2.4 g/dL — ABNORMAL LOW (ref 3.5–5.2)
Alkaline Phosphatase: 46 U/L (ref 39–117)
BUN: 22 mg/dL (ref 6–23)
Chloride: 100 mEq/L (ref 96–112)
GFR calc Af Amer: 52 mL/min — ABNORMAL LOW (ref >60)
Potassium: 3.9 mEq/L (ref 3.5–5.1)
Sodium: 142 mEq/L (ref 135–145)
Total Bilirubin: 0.5 mg/dL (ref 0.3–1.2)

## 2010-04-04 ENCOUNTER — Inpatient Hospital Stay (HOSPITAL_COMMUNITY): Payer: Medicare Other

## 2010-04-04 LAB — CBC
HCT: 41.4 % (ref 39.0–52.0)
MCH: 27.4 pg (ref 26.0–34.0)
MCHC: 31.9 g/dL (ref 30.0–36.0)
MCV: 86.1 fL (ref 78.0–100.0)
RDW: 14.4 % (ref 11.5–15.5)

## 2010-04-04 LAB — BASIC METABOLIC PANEL
BUN: 16 mg/dL (ref 6–23)
Calcium: 8.4 mg/dL (ref 8.4–10.5)
Creatinine, Ser: 1.57 mg/dL — ABNORMAL HIGH (ref 0.4–1.5)
GFR calc non Af Amer: 44 mL/min — ABNORMAL LOW (ref >60)
Glucose, Bld: 110 mg/dL — ABNORMAL HIGH (ref 70–99)

## 2010-04-06 LAB — CBC
MCHC: 31.7 g/dL (ref 30.0–36.0)
RDW: 14.4 % (ref 11.5–15.5)

## 2010-04-06 LAB — BASIC METABOLIC PANEL
Calcium: 8.1 mg/dL — ABNORMAL LOW (ref 8.4–10.5)
Creatinine, Ser: 1.37 mg/dL (ref 0.4–1.5)
GFR calc Af Amer: >60 mL/min (ref >60)
GFR calc non Af Amer: 52 mL/min — ABNORMAL LOW (ref >60)
Sodium: 139 mEq/L (ref 135–145)

## 2010-04-09 NOTE — Discharge Summary (Signed)
[name], [name]                ACCOUNT NO.:  [id number]  MEDICAL RECORD NO.:  [id number]           PATIENT TYPE:  I  LOCATION:  5125                         FACILITY:  [facility]  PHYSICIAN:  Currie Paris, M.D.DATE OF BIRTH:  Jan 22, 1941  DATE OF ADMISSION:  04/02/2010 DATE OF DISCHARGE:  04/06/2010                              DISCHARGE SUMMARY   ADMITTING PHYSICIAN:  Angelia Mould. Derrell Lolling, MD  DISCHARGING PHYSICIAN:  Currie Paris, MD  PRIMARY SURGEON:  Abigail Miyamoto, MD  REASON FOR ADMISSION:  Mr. [name] is a 70 year old male who was admitted for perforated appendicitis.  He underwent a laparoscopic appendectomy by Dr. Magnus Ivan on March 27, 2010.  A drain was placed after copious irrigation due to concerns for possible postoperative abscess. Postoperatively, the patient did well.  He was later discharged home with the drain.  However, the patient returned on day of admission with minimal flatus and nausea and vomiting as well as abdominal distention. Upon arrival, he was found to have what appeared to be a small bowel obstruction or postoperative ileus.  At this time, we are asked to admit the patient for further evaluation.  ADMITTING DIAGNOSES: 1. Postoperative ileus status post laparoscopic appendectomy. 2. Acute renal insufficiency secondary to dehydration. 3. Dehydration secondary to nausea and vomiting. 4. History of coronary artery disease. 5. COPD.  HOSPITAL COURSE:  At this time, the patient was admitted.  He was started on IV fluids as well as IV Zosyn.  He was made n.p.o. and put on bowel rest.  He did continue to have nausea and emesis, and an NG tube was placed.  Approximately, 1500 mL of bilious output was returned. After this was done, the patient felt much better.  It was felt that at this time the patient had a partial small bowel obstruction.  Over the next day or so, the NG tube was continued.  It was felt that eventually his JP drain was  putting out only serous drainage and could be discontinued.  Once his drain was removed, the patient began having multiple bowel movements and decreased NG tube output.  His NG tube was eventually able to be discontinued on hospital day 3.  After this, his diet was advanced as tolerated.  By the day of discharge, he was tolerating regular diet with no pain and no complaints.  His abdomen was soft, nontender, nondistended with active bowel sounds.  DISCHARGE DIAGNOSES: 1. Status post laparoscopic appendectomy for perforated appendicitis. 2. Partial small bowel obstruction, resolved. 3. Coronary artery disease. 4. Chronic obstructive pulmonary disease.  DISCHARGE MEDICATIONS:  Please see medication reconciliation form.  DISCHARGE INSTRUCTIONS:  The patient will follow up with [place] in 2 weeks for postoperative visit; otherwise, he is encouraged at this time to increase his activity.  He is encouraged not to lift anything greater than 15 pounds until the end of this week and then after that he can resume normal activities as he will be 2 weeks postop.     Letha Cape, PA   ______________________________ Currie Paris, M.D.    KEO/MEDQ  D:  04/06/2010  T:  04/06/2010  Job:  [id number]  cc:   Abigail Miyamoto, M.D.  Electronically Signed by Barnetta Chapel PA on 04/07/2010 04:07:15 PM Electronically Signed by Cyndia Bent M.D. on 04/09/2010 05:52:30 AM

## 2010-04-14 ENCOUNTER — Encounter: Payer: Self-pay | Admitting: Pulmonary Disease

## 2010-04-14 NOTE — H&P (Signed)
NAMEELIGAH, ANELLO                ACCOUNT NO.:  [id number]  MEDICAL RECORD NO.:  [id number]           PATIENT TYPE:  I  LOCATION:  5125                         FACILITY:  [facility]  PHYSICIAN:  Angelia Mould. Derrell Lolling, M.D.DATE OF BIRTH:  [birth year]  DATE OF ADMISSION:  04/02/2010 DATE OF DISCHARGE:                             HISTORY & PHYSICAL   CHIEF COMPLAINT:  Abdominal distention, nausea, and vomiting.  HISTORY OF PRESENT ILLNESS:  Mr. Stemen is a pleasant 70 year old gentleman who was recently admitted for perforated appendicitis and ended up undergoing laparoscopic appendectomy by Dr. Abigail Miyamoto on March 27, 2010.  A drain was placed after copious irrigation due to the concern for possible abscess postoperatively.  However, the patient did well postoperatively, initially having some evidence of bowel activity and tolerating liquid diet.  The decision was made on postoperative day #2 that patient was doing well and would be discharged home.  He was sent home with plans for followup including drain management.  However, the patient states that since being home, he really has not had any consistent bowel function.  He has had minimal flatus intermittently, but certainly no bowel movement.  As the week has gone on, he has had progressively more abdominal distention and discomfort leading to significant amount of nausea and vomiting last evening.  He really denies much pain and he certainly denies any fever, but states he has gotten significantly worse.  He has been able to void at home, though stating his urine appears darker in color.  He denies any chest pain, shortness of breath, palpitations.  He denies any dysuria or hematuria.  He denies any hematemesis.  He contacted our office today expressing these symptoms and was advised to come to the emergency department for [place] to evaluate and work up.  Since being in the emergency department, he has had labs and plain films which  are concerning for at least ileus versus partial bowel obstruction.  We are asked to admit the patient for ongoing management workup.  PAST MEDICAL HISTORY: 1. Hypertension. 2. COPD. 3. Coronary artery disease. 4. Obstructive sleep apnea.  PAST SURGICAL HISTORY:  The patient has had recent laparoscopic appendectomy.  He has had percutaneous transluminal coronary angioplasty with stent as well in 2011, I believe.  FAMILY HISTORY:  Noncontributory to the present case.  SOCIAL HISTORY:  Patient continues to smoke anywhere from half a pack to a pack of cigarettes daily.  He denies any illicit drug use.  He does have an occasional alcoholic beverage.  DRUG ALLERGIES:  Maintain SULFA and ZANTAC.  MEDICATIONS:  Please see medication reconciliation sheet.  REVIEW OF SYSTEMS:  Please see history of present illness for pertinent findings.  PHYSICAL EXAMINATION:  GENERAL:  Reveals an obese 70 year old gentleman who does not appear in any significant distress and certainly nontoxic appearing. VITAL SIGNS:  Temperature of 97.5, heart rate of 87, respiratory rate of 24, blood pressure 108/55, oxygen saturation 96% on room air. ENT:  Unremarkable. NECK:  Supple without lymphadenopathy.  Trachea is midline.  No thyromegaly or masses. LUNGS:  Clear.  No wheezes, rhonchi, or rales.  Normal respirations.  No use of accessory muscles. HEART:  Regular rate and rhythm.  No murmurs, gallops, or rubs. Carotids 2+ and brisk without bruits.  Peripheral pulses intact and symmetrical. ABDOMEN:  Distended, but soft.  He has hypoactive bowel sounds. Surgical incisions from last week are intact showing no evidence of infection or wound separation.  Right lower quadrant surgical drain is intact.  There is serous fluid within the drain.  No evidence of pus or stool. RECTAL:  Exam is deferred. EXTREMITIES:  Good active range of motion in all extremities without crepitus or pain.  Normal muscle strength  and tone without atrophy. SKIN:  Otherwise warm and dry.  No jaundice is appreciated. NEUROLOGIC:  The patient is alert and oriented x3.  DIAGNOSTICS:  CBC shows white blood cell count of 14.7, hemoglobin of 15.1, hematocrit of 44.2, platelet count of 267.  Metabolic panel shows a sodium of 136, potassium of 4.3, chloride of 96, CO2 of 26, BUN of 28, creatinine of 1.8, glucose of 113.  Liver enzymes including lipase within normal limits.  Urinalysis is negative.  Imaging thus far includes an abdominal flat plate which shows dilated loops of small bowel and minimal air within the colon suggestive of at least ileus versus partial bowel obstruction.  Surgical drain in the right lower quadrant appears in good position.  IMPRESSION: 1. Postop ileus vs. partial small bowel obstruction, status post laparoscopic appendectomy on February 25. 2. Acute renal insufficiency secondary to dehydration. 3. Dehydration secondary to nausea and vomiting. 4. History of coronary artery disease. 5. History of chronic obstructive pulmonary disease.  PLAN:  We will admit the patient, begin IV fluid hydration, IV antibiotics consisting of Zosyn 3.375 grams IV q.6 h. or per pharmacy. We will make him n.p.o. and put his bowel at rest.  If the patient has repeated nausea and vomiting, we will place on NG tube to low intermittent wall suction.  He is scheduled for CT scan of abdomen and pelvis this afternoon.  I suspect this will not be with IV contrast given his renal insufficiency.  I have discussed with the patient and family the possibilities including ileus, postoperative abscess, or postoperative staple line leak as well as expected course of his stay.  They understand and are agreeable to our plan.     Brayton El, PA-C   ______________________________ Angelia Mould. Derrell Lolling, M.D.    KB/MEDQ  D:  04/02/2010  T:  04/03/2010  Job:  [id number]  Electronically Signed by Brayton El  on 04/13/2010  11:01:17 AM Electronically Signed by Claud Kelp M.D. on 04/14/2010 09:38:04 AM

## 2010-04-15 LAB — BASIC METABOLIC PANEL
Calcium: 8.8 mg/dL (ref 8.4–10.5)
Creatinine, Ser: 1.29 mg/dL (ref 0.4–1.5)
GFR calc Af Amer: >60 mL/min (ref >60)
GFR calc non Af Amer: 55 mL/min — ABNORMAL LOW (ref >60)
Glucose, Bld: 109 mg/dL — ABNORMAL HIGH (ref 70–99)
Sodium: 142 mEq/L (ref 135–145)

## 2010-04-15 LAB — CBC
Hemoglobin: 12.6 g/dL — ABNORMAL LOW (ref 13.0–17.0)
MCHC: 32.9 g/dL (ref 30.0–36.0)
Platelets: 148 10*3/uL — ABNORMAL LOW (ref 150–400)

## 2010-04-26 ENCOUNTER — Ambulatory Visit (INDEPENDENT_AMBULATORY_CARE_PROVIDER_SITE_OTHER): Payer: Medicare Other | Admitting: Pulmonary Disease

## 2010-04-26 ENCOUNTER — Encounter: Payer: Self-pay | Admitting: Pulmonary Disease

## 2010-04-26 VITALS — BP 108/60 | HR 73 | Temp 97.4°F | Ht 71.0 in | Wt 202.8 lb

## 2010-04-26 DIAGNOSIS — J438 Other emphysema: Secondary | ICD-10-CM

## 2010-04-26 MED ORDER — ALBUTEROL SULFATE HFA 108 (90 BASE) MCG/ACT IN AERS: 2.0000 | INHALATION_SPRAY | Freq: Four times a day (QID) | RESPIRATORY_TRACT | Status: DC | PRN

## 2010-04-26 MED ORDER — BUDESONIDE-FORMOTEROL FUMARATE 160-4.5 MCG/ACT IN AERO: 2.0000 | INHALATION_SPRAY | Freq: Two times a day (BID) | RESPIRATORY_TRACT | Status: DC

## 2010-04-26 NOTE — Patient Instructions (Signed)
Will try increasing symbicort to the 160/4.5 strength  2 inhalations am and pm.  Rinse mouth well. Stop combivent for rescue, but stay on spiriva Use proair for rescue 2 inhalations every 6hrs if needed Stop smoking!!! followup with me in 6mos

## 2010-04-26 NOTE — Progress Notes (Signed)
  Subjective:    Patient ID: [name], male    DOB: 1940-12-31, 70 y.o.   MRN: [id number]  HPI The pt comes in today for f/u of his known emphysema, complicated by ongoing smoking.  He is maintaining his usual baseline, and reports no recent acute exac or pulmonary infection.  Denies signific cough or mucus   Review of Systems  Constitutional: Positive for fever and appetite change. Negative for unexpected weight change.  HENT: Negative for ear pain, congestion, sore throat, rhinorrhea, sneezing, trouble swallowing, dental problem and postnasal drip.   Eyes: Negative for redness.  Respiratory: Positive for cough, shortness of breath and wheezing.   Cardiovascular: Negative for chest pain, palpitations and leg swelling.  Gastrointestinal: Positive for nausea, abdominal pain and diarrhea.  Genitourinary: Negative for dysuria.  Musculoskeletal: Negative for joint swelling.  Skin: Negative for rash.  Neurological: Negative for syncope and headaches.  Hematological: Bruises/bleeds easily.  Psychiatric/Behavioral: Negative for dysphoric mood. The patient is not nervous/anxious.        Objective:   Physical Exam Constitutional:  Well developed, no acute distress  Cardiovascular:  Normal rate, regular rhythm, no rubs or gallops.  No murmurs     Pulmonary : decreased breath sounds, no stridor or respiratory distress   No rales, rhonchi, or wheezing  Musculoskeletal:  No lower extremity edema noted.  Skin:  No cyanosis noted  Neurologic:  Alert, appropriate, moves all 4 extremities without obvious deficit.          Assessment & Plan:

## 2010-04-26 NOTE — Assessment & Plan Note (Signed)
The pt is at a stable baseline, but does have doe that interferes with his QOL.  He is on a lower strength of symbicort, so will try him a higher strength.  I have also stressed the importance of smoking cessation.  Will make the changes to his meds, and see how he does.

## 2010-06-18 NOTE — Cardiovascular Report (Signed)
[name]. [facility]  Patient:    [name], [name] Visit Number: [id number] MRN: [id number]          Service Type: MED Location: 6500 [id number] Attending Physician:  [name] Dictated by:   [name], M.D. Proc. Date: 05/08/01 Admit Date:  05/08/2001 Discharge Date: 05/11/2001   CC:         [name], M.D.   Cardiac Catheterization  REFERRING PHYSICIAN:  Soyla [name]. Renne [name], M.D.  PROCEDURES: 1. Left heart catheterization. 2. Coronary cineangiography. 3. Left internal mammary artery cineangiography. 4. Left ventricular cineangiography. 5. Angioplasty with PTCA and stenting of the right coronary artery. 6. Perclose of the right femoral artery.  INDICATIONS FOR PROCEDURES:  This 70 year old male has a history of coronary artery disease, status post coronary angiography by Dr. Elsie [name] in 1993; showing a mild stenosis in his right coronary artery.  His follow-up medical treatment was very stable, until approximately one year ago.  He again had the onset of chest pain, which progressively got worse until his recent evaluation by [name] -- which included a treadmill exercise tolerance test, which was markedly positive showing evidence of inferolateral myocardial ischemia. Because of his unstable angina pattern and markedly positive treadmill, he was then scheduled for cardiac catheterization.  DESCRIPTION OF PROCEDURE:  After signing an informed consent, the patient was premedicated with 50 mg of Benadryl intravenously and brought to the cardiac catheterization lab.  His right groin is prepped and draped in sterile fashion, and anesthetized locally with 1% lidocaine.  A 6-French introducer sheath was inserted percutaneously into the right femoral artery.  Then 6-French #4 Judkins catheters were used to make injections into the native coronary arteries and the right coronary catheter was used to make a midstream injection into the  left subclavian artery, visualizing the left internal mammary artery.  A 6-French pigtail catheter was used to measure pressures in the left ventricle and aorta, and to make a midstream injection into the left ventricle.  After noting total occlusion of the right coronary artery, and fair collateral circulation distally, also with normal left ventricular function, we discussed alternatives with the patient to include:  possible EECP versus and attempt to open up the totally occluded right coronary artery.  We explained that our procedure would include an attempt to cross the totally occluded area with a wire, and if this was easily crossed we would proceed with the angioplasty. If not, then we would abort the procedure and not proceed.  He understood this alternative well, and agreed to proceed with this attempt.  We then selected a 6-French JR4 guiding catheter, which was advanced to the root of the aorta.  After engaging the tip of this catheter in the ostium of the right coronary artery, we advanced a Cross-It guide wire through the guide catheter, and advanced it into the proximal right coronary artery. The guide wire advanced through this totally occluded area, with relative ease and advanced into the distal segment.  Further injections into the right coronary artery showed dye hangup in the initial segment, and the tip did not seem to be free distally.  We withdrew the wire, and after reforming the tip the wire was again advanced into the right coronary artery.  After guided advancement it advanced into the distal right coronary artery and the tip was free; advanced into several side branches freely.  We then had antegrade flow, but again with a marked dissection line proximally.  We  then reviewed this with another cardiologist in the catheterization lab, and felt that we had good access distally with possibility of the wire being in and out of the endothelium proximally.  We  then selected a 2.0 x 20 mm balloon catheter, which was advanced over the guide wire and positioned within the lesion area.  Several sequential inflations were made with the maximum pressure of 10 atm, and a maximum time of 15 sec.  Further injections showed marked improvement in antegrade flow and marked improvement in the vessel size; showing a much larger vessel and predilation.  We then selected a 3.5 x 23 mm stent deployment system, which after proper preparation was advanced over the guide wire and positioned within the proximal portion of this lesion.  The stent was then deployed with one inflation at 12 atm for 20 sec.  After this deployment further injections showed a marked dissection line further down the vessel, and a second 3.5 x 23 mm Zeta stent was selected and advanced into the right coronary artery just distal to the first stent.  This stent was then deployed at maximum pressure of 12 atm for a time of 20 sec.  We then made further injection showing one last dissection line, which extended to just before the acute angle and just before the large acute angle branch.  We then selected a third stent (3.5 x 18 mm), which was advanced over the guide wire and positioned distal to the second stent.  This stent was deployed with the same pressure of 12 atm and the same time of 20 sec.  After this final stent deployment, further injections were made into the right coronary artery; showing an excellent angiographic result with 0% residual lesion and no evidence of residual dissection or clot.  The patient tolerated the procedure well and no complications were noted.  At the end of the procedure the catheter and sheath were removed from the right femoral artery, and hemostasis was easily obtained with the Perclose closure system.  MEDICATIONS GIVEN:  Heparin 5000 units IV, Integrilin bolus and drip per pharmacy protocol.  HEMODYNAMIC DATA: 1. Left ventricular pressure:   174/10-24. 2. Aortic pressure:  174/92 with a mean 123.  3. Left ventricular ejection fraction 70%.  CINEANGIOGRAPHIC FINDINGS:  CORONARY CINEANGIOGRAPHY: 1. LEFT CORONARY ARTERY:  The ostium and left main appear normal. 2. LEFT ANTERIOR DESCENDING ARTERY:  Has a mild stenotic lesion in its middle    segment of approximately 30%.  There is normal antegrade flow and    normal runoff. 3. CIRCUMFLEX CORONARY ARTERY:  Appears normal. 4. RIGHT CORONARY ARTERY:  The distal right coronary artery showed retrograde    during injection into the left coronary artery.  The right coronary artery    on the initial injections appear to be a small vessel, with total    occlusion in the proximal segment, and a nonvisualized segment prior to    revascularization in the middle segment, before the acute angle.  The    distal segment filled slowly during these injections. 5. GRAFTS:  Left internal mammary artery appears normal.  LEFT VENTRICULAR CINEANGIOGRAM:  The left ventricular chamber size, contractility and wall thickness appear normal.  There was no evidence of segmental abnormality.  The ejection fraction was measured at 70%.  ANGIOPLASTY CINEANGIOGRAPHY:  Cineangiography taken during the angioplasty procedure showed proper positioning of the guide wire and marked dissection line, with reestablishment of antegrade flow after the initial pass with the guide wire.  Further cineangiography showed good opening with the 2.0 balloon angioplasty and reestablishment of distal right coronary flow.  Further cineangiography showed proper positioning of the stent deployment systems, within the proximal and middle segment of the right coronary artery.  Final injections into the right coronary artery showed an excellent angiographic result, with 0% residual lesion.  Reestablishment of normal TIMI-3 antegrade flow and normal distal runoff in the right coronary artery was seen.  There was evidence of one small  clot distally, which apparently embolized when flow was reestablished.  This was felt to not be obstructing flow, and hopefully the Integrilin drip will help this dissolve overnight.  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 2. Mild stenosis of left anterior descending artery, 30%. 3. Normal circumflex and left main coronary arteries. 4. Normal left ventricular function, with ejection fraction 705. 5. Normal left internal mammary artery. 6. Fair collateral flow to the distal right coronary artery. 7. Successful crossing, opening and stenting of the right coronary artery. 8. Successful Perclose of the right femoral artery.  DISPOSITION:  Will monitor on the EAU overnight and continue the Integrilin drip for 18 hr. Dictated by:   [name], M.D. Attending Physician:  [name] DD:  05/08/01 TD:  05/08/01 Job: [id number]

## 2010-06-18 NOTE — Op Note (Signed)
   NAME:  [name], [name]                          ACCOUNT NO.:  [id number]   MEDICAL RECORD NO.:  [id number]                   PATIENT TYPE:  AMB   LOCATION:  ENDO                                 FACILITY:  [facility]   PHYSICIAN:  Georgiana Spinner, M.D.                 DATE OF BIRTH:  1940/02/25   DATE OF PROCEDURE:  07/02/2002  DATE OF DISCHARGE:                                 OPERATIVE REPORT   PROCEDURE:  Upper endoscopy.   ENDOSCOPIST:  Georgiana Spinner, M.D.   INDICATIONS FOR PROCEDURE:  Hemoccult positivity.   ANESTHESIA:  Demerol 50 mg, Versed 5 mg.   DESCRIPTION OF PROCEDURE:  The patient was mildly sedated in the left  lateral decubitus position.  The Olympus videoscopic endoscope was inserted  into the mouth and passed under direct vision through the esophagus which  appeared normal.  There was no evidence of Barrett's.  We entered into the  stomach.  The fundus, body, antrum, duodenal bulb, second portion of the  duodenum were visualized.  Representative pictures were taken.  From this  point the endoscope was slowly withdrawn, taking circumferential views of  the duodenal mucosa.  The endoscope was then pulled back in the stomach and  placed in retroflexion to view the stomach from below.  The endoscope was  then straightened and withdrawn, taking circumferential views of the  remaining gastric and esophageal mucosa, stopping in the body of the stomach  and between the folds.  The mucosa appeared somewhat adenomatous, villus-  appearing, which may have been a normal variant, but biopsies were taken.  The endoscope was withdrawn taking circumferential views of the remaining  gastric and esophageal mucosa.  The patient's vital signs, pulse oximeter  remained stable.  The patient tolerated the procedure well without apparent complications.   FINDINGS:  Changes of the mucosa of unknown cause.   PLAN:  Await the biopsy report.  The patient will call me for the results  and will  follow up with me as an outpatient.  Proceed with a colonoscopy as  planned.                                                Georgiana Spinner, M.D.    GMO/MEDQ  D:  07/02/2002  T:  07/02/2002  Job:  [id number]

## 2010-06-18 NOTE — Op Note (Signed)
   NAME:  [name], [name]                          ACCOUNT NO.:  [id number]   MEDICAL RECORD NO.:  [id number]                   PATIENT TYPE:  AMB   LOCATION:  ENDO                                 FACILITY:  [facility]   PHYSICIAN:  Georgiana Spinner, M.D.                 DATE OF BIRTH:  05/16/1940   DATE OF PROCEDURE:  07/02/2002  DATE OF DISCHARGE:                                 OPERATIVE REPORT   PROCEDURE:  Colonoscopy.   INDICATIONS:  Hemoccult positivity.   ANESTHESIA:  None further given.   DESCRIPTION OF PROCEDURE:  With the patient mildly sedated in the left  lateral decubitus position, the Olympus videoscopic colonoscope was inserted  into the rectum and passed under direct vision to the cecum, identified by  the ileocecal valve and appendiceal orifice, both of which were  photographed.  From this point the colonoscope was slowly withdrawn, taking  circumferential views of the entire colonic mucosa, stopping only in the  rectum, which appeared normal on direct and showed small internal  hemorrhoids on retroflexed view.  The endoscope was straightened and  withdrawn.  The patient's vital signs and pulse oximetry remained stable.  The patient tolerated the procedure well without apparent complications.   FINDINGS:  Internal hemorrhoids, otherwise an unremarkable colonoscopic  examination to the cecum.   PLAN:  Will have the patient follow up with me as described above in the  endoscopy note.  Consider repeat examination in approximately five years.                                               Georgiana Spinner, M.D.    GMO/MEDQ  D:  07/02/2002  T:  07/02/2002  Job:  [id number]

## 2010-06-18 NOTE — Cardiovascular Report (Signed)
[place]  Patient:    [name], [name] Visit Number: [id number] MRN: [id number]          Service Type: CAT Location: CCUA 2923 01 Attending Physician:  Silvestre Mesi Dictated by:   Aram Candela. Aleen Campi, M.D. Proc. Date: 05/08/01 Admit Date:  05/08/2001                          Cardiac Catheterization  PROCEDURE: 1. Emergency left heart catheterization. 2. Angioplasty of the right coronary artery with PTCA, angiojet, and Adenosine    intracoronary injections. 3. Temporary transvenous pacemaker insertion via left femoral vein. 4. Perclose of the left femoral artery and left femoral vein.  INDICATIONS:  This 70 year old male underwent cardiac catheterization earlier in the day finding a totally occluded right coronary artery which was opened and stented with a good angiographic result, reestablishing normal antegrade flow. He was then admitted to the EAU for monitoring and Integrilin drip and while on the EAU with Integrilin drip, he had the onset of angina and developed ventricular fibrillation with seizure activity.  He was resuscitated and successfully cardioverted with his post cardioversion electrocardiogram showing atrial fibrillation and ST depression in his anterior leads.  He had no ST elevation or depression in his inferior leads.  We saw him immediately after his resuscitation and with the ventricular fibrillation, we felt that he needed to be restudied for a relook at his right coronary artery.  His ACT in the EAU was 100.  DESCRIPTION OF PROCEDURE:  After his resuscitation, the patient was transported to the cardiac catheterization lab where his left groin was prepped and draped in the usual sterile fashion and anesthetized locally with 1% lidocaine.  A 6 French introducer sheath was inserted percutaneously into the left femoral artery.  A 6 French #4 Judkins right coronary catheter was inserted through the right femoral artery  sheath and advanced to the root aorta. Injections were made into the right coronary artery.  After noting total occlusion of his proximal right coronary artery with marked clotting throughout the proximal right, we then inserted a 6 [place] JR4 guide catheter which was engaged in the right coronary artery.  We then selected a short BMW guide wire which was advanced through the guide catheter and into the right coronary artery.  The BMW guide wire was advanced through the clotted area throughout the stented segment and advanced into the distal right coronary artery.  This did not reestablish antegrade flow.  We then selected a 3.5 x 30 mm CrossSail balloon catheter which was advanced over the guide wire and positioned within the proximal stented area.  One inflation was made at 18 atmospheres for 23 seconds.  This balloon was then advanced into the distal portion of the stented area and another inflation was made at 18 atmospheres for 23 seconds.  We then had very slow antegrade flow and Adenosine injections were made in the right coronary artery which caused marked slowing of his heart rate.  A 5 French introducer sheath was then inserted percutaneously into the left femoral vein and a 5 [place] temporary transvenous pacemaker was inserted through the left femoral vein sheath and advanced to the right atrium and right ventricle.  The tip was positioned in the apex of the right ventricle.  Good pacing parameters were obtained.  We then proceeded with further multiple Adenosine injections which very gradually improved distal runoff.  With better runoff, we were able  to visualize the entire right coronary artery better and noted marked clotting throughout the right coronary artery including the acute angle and at the crux.  We then elected to proceed with an angiojet procedure and after properly preparing this catheter, it was advanced over the guide wire and advanced into the distal right  coronary artery to the crux.  Two full runs of the angiojet procedure were performed pulling back from the crux to the proximal right coronary artery.  After the angiojet procedure, further injections into the right coronary artery showed a marked clearing of the clot bulk throughout the mid and distal right coronary artery.  There were two small areas of residual clot in the segment between the acute angle and the posterior descending and another at the crux. WE then selected a 3.0 x 15 mm CrossSail balloon catheter which was advanced over the guide wire and positioned within these sites.  The first site had two inflations both at 4 atmospheres and both for 2 minutes.  The second site had one inflation at 2 atmospheres for 2 minutes.  After this final inflation, further injection into the right coronary artery showed an excellent angiographic result with 0% residual lesion throughout the stented area and marked alleviation of the bulky clotted area throughout the mid and distal right coronary artery.  There was reestablishment of normal antegrade flow and normal runoff.  The patient tolerated this procedure well and no complications were noted.  At the end of the procedure, we checked his ACT multiple times and further bolus of heparin was given and a heparin drip was started at 1200 units per hour.  We then transferred the patient to the coronary care unit where instructions were given to check his ACT freely and discussions with pharmacist were made to assure that his ACT remained elevated for at least 48 hours.  MEDICATIONS GIVEN:  Heparin total of 10,000 more units.  Integrilin drip with another bolus and another Integrilin drip.  Adenosine multiple right coronary injections of Adenosine were made.  CINE FINDINGS: Right coronary artery.  The initial injection into the right coronary artery showed total occlusion of the proximal right coronary artery with nonvisualization distally.   Further cine showed successful crossing this occluded area with the guide wire.  Further cine showed proper positioning of the 3.5 mm balloon within the stented area and injections after PTCA of the  stented area showed no antegrade flow.  Further cine showed proper positioning of the Angiojet system and final injections into the right coronary artery showed total opening throughout the right coronary artery with reestablishment of normal antegrade flow and very minor residual clot seen in the distal right coronary artery.  FINAL DIAGNOSES: 1. Abrupt reclosure of the right coronary artery while on Integrilin drip. 2. Successful reopening of the right coronary artery with successful wire    crossing, PTCA, angiojet, and Adenosine intracoronary injections. 3. Successful insertion of temporary transvenous pacemaker with normal    function seen. 4. Successful Perclose of the left femoral artery and left femoral vein.  DISPOSITION:  Will monitor now on the CCU with continued Integrilin drip and the addition of the heparin drip following closely his ACT to assure that it remains over 300 over the next two days. Dictated by:   Aram Candela. Aleen Campi, M.D. Attending Physician:  Silvestre Mesi DD:  05/08/01 TD:  05/09/01 Job: [id number]

## 2010-06-18 NOTE — Discharge Summary (Signed)
[place]  Patient:    [name], [name] Visit Number: [id number] MRN: [id number]          Service Type: MED Location: 6500 [id number] Attending Physician:  Silvestre Mesi Dictated by:   Aram Candela. Aleen Campi, M.D. Admit Date:  05/08/2001 Discharge Date: 05/11/2001   CC:         [name] D. Renne Crigler, M.D.   Discharge Summary  FINAL DIAGNOSES: 1. Myocardial infarction, inferior, small. 2. Ventricular fibrillation with successful cardioversion. 3. 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  REASON FOR ADMISSION:  This 70 year old male has a history of coronary artery disease, single vessel, and was status post coronary angiography by Dr. Elsie Lincoln in 1993, showing mild stenosis of his right coronary artery.  He was followed medically by Dr. Merri Brunette, and recently had decreased exertion tolerance and the onset of chest pain on exertion, and a treadmill exercise tolerance test was markedly positive showing evidence for inferolateral myocardial ischemia.  He was then scheduled for cardiac catheterization because of his progressive increase in symptomatology clinically.  HOSPITAL COURSE:  The patient was admitted as an outpatient for diagnostic cardiac catheterization and possible angioplasty.  At his cardiac catheterization he was found to have chronic total occlusion of his right coronary artery with mild 30% stenosis of his left anterior descending artery. He had normal left ventricular function with an ejection fraction of 70%, and fair collateral flow to his distal right coronary artery.  He then underwent successful crossing of the lesion, and subsequent opening with angioplasty and stenting of the right coronary artery.  He was then  started on an Integrilin drip, and monitored on the EAU after a successful perclose of his right femoral artery.  Approximately 4 to 5 hours post-catheterization while on the EAU, the patient had developed anterior chest pain followed by ventricular tachycardia/fibrillation, and responded to cardioversion with CPR and came back immediately without a neurologic deficit.  His electrocardiogram showed ST depression in his anterior leads, consistent with a posterior infarction, but an unusual finding of STs in his inferior leads.  He was immediately returned to the cardiac catheterization laboratory for an emergency cardiac catheterization, and at cath we found total occlusion again of his right coronary artery with marked clotting, and findings of rapid normalization of his ACT.  He had increased doses of heparin, and at cath the clotted area of his right coronary artery was easily passed with a wire and angiojet system, and after several passes with angiojet catheter there was marked clearing of the clotted right coronary artery.  With abrupt reclosure of his stented right coronary artery while on the Integrilin drip, it was felt that he had developed marked hypercoagulability, and it required high doses of heparin to reverse this trend.  We had marked difficulty in keeping his ACT therapeutic, and another bolus of Integrilin was given during the procedure.  Post-cath, very close attention was given to continuing high doses of heparin to maintain a therapeutic ACT, and serial enzymes revealed a mild elevation in his CK and CK-MB to 340 total, and an MB of 39.  His troponin-I increased to 3.37.  This rapidly decreased, and he had no further chest pain or arrhythmia.  There was no sign of congestive  heart failure.  After two days of heparinization and Integrilin drip, he remained stable without further incidence, and his anticoagulation times became much improved with lower doses of heparin.   After tolerating increased activity and being off heparin overnight, we felt that he was then stable for discharge after his enzymes rapidly returned to normal. He was then discharged home in stable and improved condition on May 11, 2001.  DISCHARGE MEDICATIONS: 1. Zocor 20 mg q.h.s. 2. Altace 5 mg q.d. 3. Enteric-coated aspirin 81 mg q.d. 4. Protonix 40 mg q.d. 5. Plavix 75 mg q.d.  ACTIVITY:  He was instructed to increase walking daily, to reach full activity in two weeks, and after an office visit in two weeks, he would then be able to return to work if stable.  DIET:  Low fat, low cholesterol.  DISCHARGE INSTRUCTIONS: 1. He was instructed to follow up with cardiac rehab for the cardiac rehab    program. 2. He was also given instructions to stop smoking.  FOLLOWUP: 1. He was given a follow-up office visit in two weeks with Dr. Aleen Campi. 2. He was told to have a routine followup with Dr. Renne Crigler.  CONDITION ON DISCHARGE:  Stable and improved. Dictated by:   Aram Candela. Aleen Campi, M.D. Attending Physician:  Silvestre Mesi DD:  05/18/01 TD:  05/19/01 Job: [id number]

## 2010-08-06 ENCOUNTER — Encounter: Payer: Self-pay | Admitting: Adult Health

## 2010-08-06 ENCOUNTER — Ambulatory Visit (INDEPENDENT_AMBULATORY_CARE_PROVIDER_SITE_OTHER): Payer: Medicare Other | Admitting: Adult Health

## 2010-08-06 VITALS — BP 136/70 | HR 73 | Temp 96.7°F | Ht 72.0 in | Wt 211.6 lb

## 2010-08-06 DIAGNOSIS — J438 Other emphysema: Secondary | ICD-10-CM

## 2010-08-06 MED ORDER — PREDNISONE 10 MG PO TABS: ORAL_TABLET | ORAL | Status: AC

## 2010-08-06 MED ORDER — AMOXICILLIN-POT CLAVULANATE 875-125 MG PO TABS: 1.0000 | ORAL_TABLET | Freq: Two times a day (BID) | ORAL | Status: AC

## 2010-08-06 NOTE — Progress Notes (Signed)
  Subjective:    Patient ID: [name], male    DOB: 1940-07-12, 70 y.o.   MRN: [id number]  HPI 70 yo male with known hx of COPD , emphysema , active smoker  08/06/2010 Acute OV  Pt presents for an acute office visit. Complains of chest congestion with cough. Unsure of color of sputum x 2 wks. Having some nasal congestion as well.  increased SOB with this congestion. OTC not helping. Cough is getting worse. He is leaving for trip soon and does not want to be sick.   It appears pt has misunderstood Symbicort instruction and is taking As needed  Several times a day.     Review of Systems Constitutional:   No  weight loss, night sweats,  Fevers, chills, fatigue, or  lassitude.  HEENT:   No headaches,  Difficulty swallowing,  Tooth/dental problems, or  Sore throat,                No sneezing, itching, ear ache, nasal congestion, post nasal drip,   CV:  No chest pain,  Orthopnea, PND, swelling in lower extremities, anasarca, dizziness, palpitations, syncope.   GI  No heartburn, indigestion, abdominal pain, nausea, vomiting, diarrhea, change in bowel habits, loss of appetite, bloody stools.   Resp:    No coughing up of blood.     No chest wall deformity  Skin: no rash or lesions.  GU: no dysuria, change in color of urine, no urgency or frequency.  No flank pain, no hematuria   MS:  No joint pain or swelling.  No decreased range of motion.  No back pain.  Psych:  No change in mood or affect. No depression or anxiety.  No memory loss.         Objective:   Physical Exam GEN: A/Ox3; pleasant , NAD, well nourished   HEENT:  [place]/AT,  EACs-clear, TMs-wnl, NOSE-clear, THROAT-clear, no lesions, no postnasal drip or exudate noted.   NECK:  Supple w/ fair ROM; no JVD; normal carotid impulses w/o bruits; no thyromegaly or nodules palpated; no lymphadenopathy.  RESP  Coarse BS  w/o, wheezes/ rales/ or rhonchi.no accessory muscle use, no dullness to percussion  CARD:  RRR, no m/r/g  ,  no peripheral edema, pulses intact, no cyanosis or clubbing.  GI:   Soft & nt; nml bowel sounds; no organomegaly or masses detected.  Musco: Warm bil, no deformities or joint swelling noted.   Neuro: alert, no focal deficits noted.    Skin: Warm, no lesions or rashes          Assessment & Plan:

## 2010-08-06 NOTE — Patient Instructions (Addendum)
Take Symbicort 2 puffs Twice daily  - brush , rinse and gargle after use--this is your controller  Augmentin 875mg  Twice daily  For 7 days with food.  Prednisone taper over next week  Mucinex DM Twice daily  As needed  Cough/congestion  Fluids and rest  NO SMOKING Please contact office for sooner follow up if symptoms do not improve or worsen or seek emergency care  follow up Dr. Shelle Iron in 6 weeks and As needed

## 2010-08-06 NOTE — Assessment & Plan Note (Signed)
COPD flare   Plan:  Augmentin 875mg  Twice daily  For 7 days with food.  Prednisone taper over next week  Mucinex DM Twice daily  As needed  Cough/congestion  Fluids and rest  NO SMOKING Please contact office for sooner follow up if symptoms do not improve or worsen or seek emergency care  follow up Dr. Shelle Iron in 6 weeks and As needed

## 2010-08-10 NOTE — Progress Notes (Signed)
Chart reviewed and agree with plans as outlined.

## 2010-09-17 ENCOUNTER — Encounter: Payer: Self-pay | Admitting: Pulmonary Disease

## 2010-09-17 ENCOUNTER — Ambulatory Visit (INDEPENDENT_AMBULATORY_CARE_PROVIDER_SITE_OTHER): Payer: Medicare Other | Admitting: Pulmonary Disease

## 2010-09-17 VITALS — BP 124/70 | HR 72 | Temp 97.4°F | Ht 72.0 in | Wt 210.8 lb

## 2010-09-17 DIAGNOSIS — J438 Other emphysema: Secondary | ICD-10-CM

## 2010-09-17 NOTE — Progress Notes (Signed)
  Subjective:    Patient ID: [name], male    DOB: 1941-01-14, 70 y.o.   MRN: [id number]  HPI Patient comes in today for follow up of his known emphysema.  He was recently seen by our nurse practitioner for an acute exacerbation related to his ongoing smoking, and was treated with antibiotics and prednisone.  He comes in today where he is back to his usual baseline, but unfortunately continues to smoke.  He is maintaining on his Spiriva and symbicort.  He feels that his dyspnea on exertion is near his usual baseline.  He denies any significant cough or congestion at this time.   Review of Systems  Constitutional: Negative for fever and unexpected weight change.  HENT: Negative for ear pain, nosebleeds, congestion, sore throat, rhinorrhea, sneezing, trouble swallowing, dental problem, postnasal drip and sinus pressure.   Eyes: Negative for redness and itching.  Respiratory: Positive for cough, shortness of breath and wheezing. Negative for chest tightness.   Cardiovascular: Negative for palpitations and leg swelling.  Gastrointestinal: Negative for nausea and vomiting.  Genitourinary: Negative for dysuria.  Musculoskeletal: Negative for joint swelling.  Skin: Negative for rash.  Neurological: Negative for headaches.  Hematological: Bruises/bleeds easily.  Psychiatric/Behavioral: Negative for dysphoric mood. The patient is not nervous/anxious.        Objective:   Physical Exam Overweight male in no acute distress Nose patent without discharge or purulence Chest with mild decrease in breath sounds, but no wheezes or rhonchi Cardiac exam with regular rate and rhythm Lower extremities without significant edema, no cyanosis noted Alert and oriented, moves all 4 extremities.       Assessment & Plan:

## 2010-09-17 NOTE — Patient Instructions (Signed)
Stay on current meds. Stop smoking.  This is the key to getting better.  Get flu shot in the fall followup with me in 6mos.  Cancel upcoming apptm with me.

## 2010-09-17 NOTE — Assessment & Plan Note (Signed)
The patient has returned to his usual baseline after a recent acute exacerbation.  He is maintaining on his bronchodilator regimen, but unfortunately continues to smoke.  I have told him this will be a recurring problem unless he is able to quit.  I have given him a flyer for the smoking cessation program at the cancer center.  I've asked him to continue on his current bronchodilator regimen, and work on weight loss and conditioning.

## 2010-09-28 ENCOUNTER — Encounter: Payer: Self-pay | Admitting: Critical Care Medicine

## 2010-09-28 ENCOUNTER — Telehealth: Payer: Self-pay | Admitting: Pulmonary Disease

## 2010-09-28 ENCOUNTER — Ambulatory Visit (INDEPENDENT_AMBULATORY_CARE_PROVIDER_SITE_OTHER): Payer: Medicare Other | Admitting: Critical Care Medicine

## 2010-09-28 DIAGNOSIS — J438 Other emphysema: Secondary | ICD-10-CM

## 2010-09-28 DIAGNOSIS — I739 Peripheral vascular disease, unspecified: Secondary | ICD-10-CM

## 2010-09-28 DIAGNOSIS — J209 Acute bronchitis, unspecified: Secondary | ICD-10-CM

## 2010-09-28 DIAGNOSIS — I2510 Atherosclerotic heart disease of native coronary artery without angina pectoris: Secondary | ICD-10-CM

## 2010-09-28 DIAGNOSIS — F17200 Nicotine dependence, unspecified, uncomplicated: Secondary | ICD-10-CM

## 2010-09-28 MED ORDER — PREDNISONE 10 MG PO TABS: ORAL_TABLET | ORAL | Status: DC

## 2010-09-28 MED ORDER — AZITHROMYCIN 250 MG PO TABS: ORAL_TABLET | ORAL | Status: DC

## 2010-09-28 MED ORDER — NICOTINE 10 MG IN INHA: RESPIRATORY_TRACT | Status: DC

## 2010-09-28 NOTE — Progress Notes (Signed)
Subjective:    Patient ID: [name], male    DOB: 04-01-1940, 70 y.o.   MRN: [id number]  HPI  70 y.o.WM COPD Ongoing tobacco use  09/28/2010 Acute OV : ill for 5days,  More cough,  More dyspnea.  Mucus is productive ,   Not seen.  No real chest pain. Burning in chest area. No edema in feet. Still smoking  Past Medical History  Diagnosis Date  . COPD (chronic obstructive pulmonary disease)   . Active smoker   . Sleep apnea   . Obesity   . Coronary artery disease   . Benign neoplasm of colon   . Atrial fibrillation   . Pyelonephritis, unspecified   . Other and unspecified hyperlipidemia   . Personal history of peptic ulcer disease   . Elevated prostate specific antigen (PSA)   . Diaphragmatic hernia without mention of obstruction or gangrene   . Esophageal reflux   . Tobacco use disorder   . Unspecified essential hypertension   . Peripheral vascular disease      Family History  Problem Relation Age of Onset  . Heart disease Mother   . Coronary artery disease Father   . Emphysema Father      History   Social History  . Marital Status: Married    Spouse Name: N/A    Number of Children: 2  . Years of Education: N/A   Occupational History  . 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 Smoking status: Current Everyday Smoker -- 1.0 packs/day for 55 years    Types: Cigarettes  . Smokeless tobacco: Never Used   Comment: started at age 66.  prev was 2 ppd.   . Alcohol Use: Yes  . Drug Use: No  . Sexually Active: Not on file   Other Topics Concern  . Not on file   Social History Narrative  . No narrative on file     Allergies  Allergen Reactions  . Beta Adrenergic Blockers     REACTION: sexual side effects  . Ramipril     REACTION: throat swelling  . Ranitidine Hcl   . Sulfonamide Derivatives     REACTION: hematuria  . Ticlopidine Hcl     REACTION: swelling     Outpatient Prescriptions Prior to Visit  Medication Sig  Dispense Refill  . albuterol (PROAIR HFA) 108 (90 BASE) MCG/ACT inhaler Inhale 2 puffs into the lungs every 6 (six) hours as needed for wheezing.  1 Inhaler  3  . aspirin 81 MG tablet Take 81 mg by mouth daily.        . budesonide-formoterol (SYMBICORT) 160-4.5 MCG/ACT inhaler Inhale 2 puffs into the lungs 2 (two) times daily.  1 Inhaler  6  . Calcium Citrate-Vitamin D 500-200 MG-UNIT per tablet Chew 1 tablet by mouth daily.        . Cholecalciferol (VITAMIN D) 2000 UNITS CAPS Take 1 capsule by mouth daily.        . clopidogrel (PLAVIX) 75 MG tablet Take 75 mg by mouth daily.        Marland Kitchen diltiazem (CARDIZEM) 120 MG tablet Take 120 mg by mouth daily.        . fish oil-omega-3 fatty acids 1000 MG capsule Once daily      . isosorbide mononitrate (IMDUR) 60 MG 24 hr tablet Take 1/2 tab po daily       . losartan (COZAAR) 100 MG tablet Take 100 mg  by mouth daily.        . Multiple Vitamins-Minerals (MULTIVITAMIN WITH MINERALS) tablet Take 1 tablet by mouth daily.        . nebivolol (BYSTOLIC) 5 MG tablet Take 10 mg by mouth daily.       . nitroGLYCERIN (NITROSTAT) 0.4 MG SL tablet Place 0.4 mg under the tongue every 5 (five) minutes as needed.        . pantoprazole (PROTONIX) 40 MG tablet Take 40 mg by mouth daily.        . rosuvastatin (CRESTOR) 20 MG tablet Take 20 mg by mouth daily.        Marland Kitchen tiotropium (SPIRIVA) 18 MCG inhalation capsule Place 18 mcg into inhaler and inhale daily.            Review of Systems Constitutional:   No  weight loss, night sweats,  Fevers, chills, fatigue, lassitude. HEENT:   No headaches,  Difficulty swallowing,  Tooth/dental problems,  Sore throat,                No sneezing, itching, ear ache, +  nasal congestion, + post nasal drip,   CV:  Notes some burning in ant  chest pain,  Orthopnea, + PND, swelling in lower extremities, anasarca, dizziness, palpitations  GI  No heartburn, indigestion, abdominal pain, nausea, vomiting, diarrhea, change in bowel habits, loss  of appetite  Resp: Notes  shortness of breath with exertion occ  at rest.  Notes  excess mucus, notes  productive cough,  No non-productive cough,  No coughing up of blood.  Notes  change in color of mucus.  Notes  wheezing.  No chest wall deformity  Notes severe dyspnea at night  Skin: no rash or lesions.  GU: no dysuria, change in color of urine, no urgency or frequency.  No flank pain.  MS:  No joint pain or swelling.  No decreased range of motion.  No back pain.  Psych:  No change in mood or affect. No depression or anxiety.  No memory loss.     Objective:   Physical Exam  Filed Vitals:   09/28/10 1020 09/28/10 1022  BP: 110/54   Pulse: 69   Temp: 98 F (36.7 C)   TempSrc: Oral   Height: 6' (1.829 m)   Weight: 201 lb 12.8 oz (91.536 kg)   SpO2: 88% 92%    Gen: Pleasant, well-nourished, in no distress,  normal affect  ENT: No lesions,  mouth clear,  oropharynx clear, no postnasal drip  Neck: No JVD, no TMG, no carotid bruits  Lungs: No use of accessory muscles, no dullness to percussion, exp wheeze, poor airflow  Cardiovascular: RRR, heart sounds normal, no murmur or gallops, no peripheral edema  Abdomen: soft and NT, no HSM,  BS normal  Musculoskeletal: No deformities, no cyanosis or clubbing  Neuro: alert, non focal  Skin: Warm, no lesions or rashes       Assessment & Plan:   EMPHYSEMA Copd with AB flare and ongoing smoking abuse with acute bronchitis Plan Pulse prednisone  Azithromycin x 5 days Smoking cessation with Nicotrol inhaler   Nicotine addiction Nicotrol for tobacco use    Updated Medication List Outpatient Encounter Prescriptions as of 09/28/2010  Medication Sig Dispense Refill  . albuterol (PROAIR HFA) 108 (90 BASE) MCG/ACT inhaler Inhale 2 puffs into the lungs every 6 (six) hours as needed for wheezing.  1 Inhaler  3  . aspirin 81 MG tablet Take 81 mg by  mouth daily.        . budesonide-formoterol (SYMBICORT) 160-4.5 MCG/ACT  inhaler Inhale 2 puffs into the lungs 2 (two) times daily.  1 Inhaler  6  . Calcium Citrate-Vitamin D 500-200 MG-UNIT per tablet Chew 1 tablet by mouth daily.        . Cholecalciferol (VITAMIN D) 2000 UNITS CAPS Take 1 capsule by mouth daily.        . clopidogrel (PLAVIX) 75 MG tablet Take 75 mg by mouth daily.        Marland Kitchen diltiazem (CARDIZEM) 120 MG tablet Take 120 mg by mouth daily.        . fish oil-omega-3 fatty acids 1000 MG capsule Once daily      . isosorbide mononitrate (IMDUR) 60 MG 24 hr tablet Take 1/2 tab po daily       . losartan (COZAAR) 100 MG tablet Take 100 mg by mouth daily.        . Multiple Vitamins-Minerals (MULTIVITAMIN WITH MINERALS) tablet Take 1 tablet by mouth daily.        . nebivolol (BYSTOLIC) 5 MG tablet Take 10 mg by mouth daily.       . nitroGLYCERIN (NITROSTAT) 0.4 MG SL tablet Place 0.4 mg under the tongue every 5 (five) minutes as needed.        . pantoprazole (PROTONIX) 40 MG tablet Take 40 mg by mouth daily.        . rosuvastatin (CRESTOR) 20 MG tablet Take 20 mg by mouth daily.        Marland Kitchen tiotropium (SPIRIVA) 18 MCG inhalation capsule Place 18 mcg into inhaler and inhale daily.        Marland Kitchen azithromycin (ZITHROMAX) 250 MG tablet Take two once then one daily until gone  6 each  0  . nicotine (NICOTROL) 10 MG inhaler USe 6-8 cartridges per day, 60-80 puffs per cartridge   168 each  2  . predniSONE (DELTASONE) 10 MG tablet Take 4 for two days, three for two days, two for two days, then one for two days and stop   20 tablet  0

## 2010-09-28 NOTE — Telephone Encounter (Signed)
Spoke with pt and he states he is having an increase SOB, cough, chest congestion x 1 week. Pt states he would liek to come in and be seen today. i advised him that [facility] was full but Dr. Delford Field had an opening at 10 am and pt wanted to come in at that time. Nothing further was needed

## 2010-09-28 NOTE — Patient Instructions (Signed)
Use nicotrol to stop smoking Prednisone 10mg  Take 4 for two days, three for two days, two for two days, then one for two days and stop Zithromax two once then one daily until gone Stop smoking No other changes Return 2 months with dr Shelle Iron

## 2010-09-29 ENCOUNTER — Telehealth: Payer: Self-pay | Admitting: Pulmonary Disease

## 2010-09-29 ENCOUNTER — Emergency Department (HOSPITAL_COMMUNITY): Payer: Medicare Other

## 2010-09-29 ENCOUNTER — Inpatient Hospital Stay (HOSPITAL_COMMUNITY)
Admission: EM | Admit: 2010-09-29 | Discharge: 2010-10-02 | DRG: 190 | Disposition: A | Discharge status: Home / Self Care | Payer: Medicare Other | Attending: Internal Medicine | Admitting: Internal Medicine

## 2010-09-29 DIAGNOSIS — I10 Essential (primary) hypertension: Secondary | ICD-10-CM | POA: Diagnosis present

## 2010-09-29 DIAGNOSIS — J441 Chronic obstructive pulmonary disease with (acute) exacerbation: Principal | ICD-10-CM | POA: Diagnosis present

## 2010-09-29 DIAGNOSIS — G4733 Obstructive sleep apnea (adult) (pediatric): Secondary | ICD-10-CM | POA: Diagnosis present

## 2010-09-29 DIAGNOSIS — E785 Hyperlipidemia, unspecified: Secondary | ICD-10-CM | POA: Diagnosis present

## 2010-09-29 DIAGNOSIS — Z7982 Long term (current) use of aspirin: Secondary | ICD-10-CM

## 2010-09-29 DIAGNOSIS — Z7902 Long term (current) use of antithrombotics/antiplatelets: Secondary | ICD-10-CM

## 2010-09-29 DIAGNOSIS — F17200 Nicotine dependence, unspecified, uncomplicated: Secondary | ICD-10-CM | POA: Diagnosis present

## 2010-09-29 DIAGNOSIS — Z9861 Coronary angioplasty status: Secondary | ICD-10-CM

## 2010-09-29 DIAGNOSIS — I2510 Atherosclerotic heart disease of native coronary artery without angina pectoris: Secondary | ICD-10-CM | POA: Diagnosis present

## 2010-09-29 DIAGNOSIS — I4891 Unspecified atrial fibrillation: Secondary | ICD-10-CM | POA: Diagnosis present

## 2010-09-29 DIAGNOSIS — J189 Pneumonia, unspecified organism: Secondary | ICD-10-CM | POA: Diagnosis present

## 2010-09-29 LAB — POCT I-STAT 3, ART BLOOD GAS (G3+)
Acid-Base Excess: 1 mmol/L (ref 0.0–2.0)
Bicarbonate: 28.5 mEq/L — ABNORMAL HIGH (ref 20.0–24.0)
Patient temperature: 98.7
TCO2: 30 mmol/L (ref 0–100)

## 2010-09-29 LAB — POCT I-STAT, CHEM 8
BUN: 24 mg/dL — ABNORMAL HIGH (ref 6–23)
Chloride: 104 mEq/L (ref 96–112)
Creatinine, Ser: 1.3 mg/dL (ref 0.50–1.35)
Potassium: 4.7 mEq/L (ref 3.5–5.1)
Sodium: 141 mEq/L (ref 135–145)

## 2010-09-29 LAB — POCT I-STAT TROPONIN I: Troponin i, poc: 0.02 ng/mL (ref 0.00–0.08)

## 2010-09-29 LAB — CBC
Hemoglobin: 14.5 g/dL (ref 13.0–17.0)
MCH: 30.1 pg (ref 26.0–34.0)
Platelets: 175 10*3/uL (ref 150–400)
RBC: 4.81 MIL/uL (ref 4.22–5.81)
WBC: 9.1 10*3/uL (ref 4.0–10.5)

## 2010-09-29 LAB — DIFFERENTIAL
Basophils Absolute: 0 10*3/uL (ref 0.0–0.1)
Basophils Relative: 0 % (ref 0–1)
Eosinophils Absolute: 0 10*3/uL (ref 0.0–0.7)
Monocytes Relative: 4 % (ref 3–12)
Neutro Abs: 8.1 10*3/uL — ABNORMAL HIGH (ref 1.7–7.7)
Neutrophils Relative %: 88 % — ABNORMAL HIGH (ref 43–77)

## 2010-09-29 LAB — TROPONIN I: Troponin I: <0.3 ng/mL (ref <0.30)

## 2010-09-29 NOTE — Telephone Encounter (Signed)
See prior note

## 2010-09-29 NOTE — Assessment & Plan Note (Signed)
Nicotrol for tobacco use

## 2010-09-29 NOTE — Assessment & Plan Note (Addendum)
Copd with AB flare and ongoing smoking abuse with acute bronchitis Plan Pulse prednisone  Azithromycin x 5 days Smoking cessation with Nicotrol inhaler

## 2010-09-29 NOTE — Telephone Encounter (Signed)
If he is having that much of an issue with his breathing, needs to go to ER for evaluation.

## 2010-09-29 NOTE — Telephone Encounter (Signed)
Spoke with pt and he states he is going to go to the ED to be evaluated

## 2010-09-29 NOTE — Telephone Encounter (Signed)
Spoke with pt wife and she states pt can barely breathe. Pt can barely walk w/o getting severe SOB. Pt wife states he is struggling to get air in. Pt had to sleep in a recliner with his cpap to help him breathe better. Pt is using his cpap during the day to help as well. Pt was seen yesterday by Dr. Delford Field and oxygen level was 92% on RA. Pt does not have oxygen at home and wife is not sure what pt oxygen level is now. Pt was given a zpak and prednisone 10 mg taper to take. Pt wife is very concerned.I advised her if pt is struggling for air then she needed to take him to the ED. She verbalized understanding.  Pt wife is requesting recs from Dr. Shelle Iron. Please advise Dr. Shelle Iron. Thanks  Carver Fila, CMA

## 2010-09-30 ENCOUNTER — Inpatient Hospital Stay (HOSPITAL_COMMUNITY): Payer: Medicare Other

## 2010-09-30 LAB — DIFFERENTIAL
Eosinophils Relative: 0 % (ref 0–5)
Lymphocytes Relative: 10 % — ABNORMAL LOW (ref 12–46)
Lymphs Abs: 1 10*3/uL (ref 0.7–4.0)
Monocytes Absolute: 0.8 10*3/uL (ref 0.1–1.0)
Monocytes Relative: 8 % (ref 3–12)

## 2010-09-30 LAB — CBC
HCT: 43.3 % (ref 39.0–52.0)
MCH: 29.4 pg (ref 26.0–34.0)
MCHC: 32.1 g/dL (ref 30.0–36.0)
MCV: 91.5 fL (ref 78.0–100.0)
RDW: 14 % (ref 11.5–15.5)
WBC: 10 10*3/uL (ref 4.0–10.5)

## 2010-09-30 LAB — COMPREHENSIVE METABOLIC PANEL
Albumin: 3.5 g/dL (ref 3.5–5.2)
BUN: 22 mg/dL (ref 6–23)
Chloride: 106 mEq/L (ref 96–112)
Creatinine, Ser: 1.08 mg/dL (ref 0.50–1.35)
GFR calc non Af Amer: >60 mL/min (ref >60)
Total Bilirubin: 0.2 mg/dL — ABNORMAL LOW (ref 0.3–1.2)

## 2010-09-30 LAB — MAGNESIUM: Magnesium: 2.3 mg/dL (ref 1.5–2.5)

## 2010-09-30 LAB — CK TOTAL AND CKMB (NOT AT ARMC)
CK, MB: 5.2 ng/mL — ABNORMAL HIGH (ref 0.3–4.0)
CK, MB: 5.5 ng/mL — ABNORMAL HIGH (ref 0.3–4.0)
Relative Index: INVALID (ref 0.0–2.5)
Total CK: 85 U/L (ref 7–232)

## 2010-09-30 LAB — MRSA PCR SCREENING: MRSA by PCR: NEGATIVE

## 2010-09-30 LAB — GLUCOSE, CAPILLARY: Glucose-Capillary: 121 mg/dL — ABNORMAL HIGH (ref 70–99)

## 2010-10-01 LAB — GLUCOSE, CAPILLARY
Glucose-Capillary: 129 mg/dL — ABNORMAL HIGH (ref 70–99)
Glucose-Capillary: 121 mg/dL — ABNORMAL HIGH (ref 70–99)

## 2010-10-02 LAB — GLUCOSE, CAPILLARY: Glucose-Capillary: 80 mg/dL (ref 70–99)

## 2010-10-05 NOTE — H&P (Signed)
NAMEBIRD, SWETZ NO.:  [id number]  MEDICAL RECORD NO.:  [id number]  LOCATION:  [facility]                         FACILITY:  [facility]  PHYSICIAN:  Eduard Clos, MDDATE OF BIRTH:  08/22/1940  DATE OF ADMISSION:  09/29/2010 DATE OF DISCHARGE:                             HISTORY & PHYSICAL   PRIMARY CARE PHYSICIAN:  Soyla Murphy. Renne Crigler, MD  PRIMARY PULMONOLOGIST:  Charlcie Cradle Delford Field, MD, [facility]  PRIMARY CARDIOLOGIST:  Nicki Guadalajara, MD, of [facility].  CHIEF COMPLAINT:  Shortness of breath.  HISTORY OF PRESENTING ILLNESS:  A 70 year old male with known history of COPD with ongoing tobacco abuse, history of CAD status post stenting, history of hypertension, hyperlipidemia, OSA on the CPAP, hypertension, paroxysmal atrial fibrillation, has been experiencing some shortness of breath over the last 3 days which has been worsening.  The patient did go to his pulmonologist yesterday for a routine visit and was given prednisone tapering dose with azithromycin despite which had symptoms have worsened.  He had come to the ER.  In the ER, the patient had a chest x-ray, cardiac enzymes, although did not show anything acute.  The patient was still short of breath despite getting nebulizer, has been admitted for further workup.  The patient states that he is short of breath at rest and also on exertion.  Denies any chest pain, has cough and phlegm.  Denies any fever or chills.  Denies any palpitation, nausea, vomiting, diaphoresis. Denies any abdominal pain, dysuria, discharge, diarrhea.  Denies any headache, visual symptoms, or any focal deficit.  PAST MEDICAL HISTORY: 1. CAD status post stenting. 2. Hypertension. 3. COPD. 4. Ongoing tobacco abuse. 5. Hyperlipidemia. 6. Paroxysmal atrial fibrillation.  PAST SURGICAL HISTORY: 1. Stent in the heart in 2011 and also in 2003. 2. Recent admission in February 2012 for perforated  appendicitis.  MEDICATIONS PRIOR TO ADMISSION: 1. The patient is on prednisone tapering dose which was started     yesterday. 2. Azithromycin. 3. Spiriva. 4. Rosuvastatin 20 mg p.o. daily. 5. Protonix 40 mg p.o. daily. 6. Nitroglycerin p.r.n. sublingual for chest pain. 7. Bystolic 5 mg p.o. daily. 8. Multivitamin. 9. Losartan 100 mg p.o. daily. 10.Imdur 60 mg p.o. daily. 11.Fish oil. 12.Diltiazem 120 mg p.o. daily. 13.Plavix 95 mg p.o. daily. 14.Vitamin D3. 15.Symbicort 160/4.5 mcg 2 puffs twice daily. 16.Aspirin 81 mg a day. 17.Albuterol inhaler.  ALLERGIES: 1. SULFA. 2. RANITIDINE. 3. RAMIPRIL. RANITIDINE and RAMIPRIL causes anaphylaxis.  SULFA causes hematuria.  FAMILY HISTORY:  Nothing contributory.  SOCIAL HISTORY:  The patient smokes cigarettes, has been strongly advised to quit smoking and drinks whisky every night.  Denies any drug abuse.  He is married.  REVIEW OF SYSTEMS:  As present in history of presenting illness. Nothing else significant.  PHYSICAL EXAMINATION:  GENERAL:  The patient examined at bedside, appears mildly short of breath. VITAL SIGNS:  Blood pressure 144, 60, pulse 90 per minute, temperature 98.2, respirations 24 per minute, O2 sat 93% on 3 L. HEENT:  Anicteric.  No pallor.  No discharge from ears, eyes, nose, or mouth. CHEST:  Bilateral air entry present.  No rhonchi.  No crepitation. HEART:  S1,  S2 heard. ABDOMEN:  Soft, nontender.  Bowel sounds heard. CNS:  The patient is alert, awake, oriented to time, place, and person. Moves upper and lower extremities 5/5. EXTREMITIES:  Peripheral pulses felt.  No edema.  No acute ischemic changes, cyanosis, or clubbing.  LABORATORY DATA:  EKG shows normal sinus rhythm, heart rate around 75 beats per minutes with Q-waves in V1, V2 and ST-T changes are comparable with the old EKG.  Chest x-ray shows COPD, chronic bronchitis.  No active disease.  CBC: WBCs 9.1, hemoglobin is 15.3, hematocrit is  45, platelets 175, neutrophils 88%.  Basic metabolic panel:  Sodium 141, potassium 4.7, chloride 104, carbon dioxide 29, glucose 170, BUN 24, creatinine 1.3, troponin 0.02.  ASSESSMENT: 1. Acute exacerbation of chronic obstructive pulmonary disease. 2. History of coronary artery disease status post stenting, presently     chest pain free. 3. History of hypertension. 4. History of paroxysmal atrial fibrillation presently in sinus     rhythm. 5,  History of hyperlipidemia. 1. History of obstructive sleep apnea. 2. Ongoing tobacco abuse. 3. Hyperglycemia probably from steroids.  PLAN: 1. At this time, we will admit the patient to step-down unit as the     patient is quite short of breath at this time.  He is on 3 L oxygen     wherein the patient is not on oxygen at home. 2. For his COPD exacerbation, at this time the patient will be placed     on steroid, nebulizer, antibiotics, and Pulmicort.  At this time,     we are also going to add a BNP level. 3. For his mild hyperglycemia which could be from steroid use, I am     going to check a hemoglobin A1c.  The patient will be on sliding     scale coverage. 4. History of coronary artery disease status post stenting.  The     patient is presently chest pain free.  We will cycle cardiac     markers, continue his Plavix and aspirin and Bystolic. 5. The patient states he drinks whiskey every night.  We will keep him     on alcohol withdrawal protocol.  The patient will need tobacco     cessation counseling. 6. I will order an ABG. 7. Further recommendation based on tests ordered and clinic course.     Eduard Clos, MD     ANK/MEDQ  D:  09/29/2010  T:  09/29/2010  Job:  [id number]  cc:   Nicki Guadalajara, M.D. Soyla Murphy. Renne Crigler, M.D. Charlcie Cradle Delford Field, MD, [facility]  Electronically Signed by Midge Minium MD on 10/05/2010 09:36:49 AM

## 2010-10-06 ENCOUNTER — Ambulatory Visit (INDEPENDENT_AMBULATORY_CARE_PROVIDER_SITE_OTHER): Payer: Medicare Other | Admitting: Pulmonary Disease

## 2010-10-06 ENCOUNTER — Encounter: Payer: Self-pay | Admitting: Pulmonary Disease

## 2010-10-06 VITALS — BP 130/68 | HR 84 | Temp 97.9°F | Ht 72.0 in | Wt 211.4 lb

## 2010-10-06 DIAGNOSIS — J438 Other emphysema: Secondary | ICD-10-CM

## 2010-10-06 NOTE — Discharge Summary (Signed)
[name], [name]                ACCOUNT NO.:  [id number]  MEDICAL RECORD NO.:  [id number]  LOCATION:  5532                         FACILITY:  [facility]  PHYSICIAN:  Ladell Pier, M.D.   DATE OF BIRTH:  [birth year]  DATE OF ADMISSION:  09/29/2010 DATE OF DISCHARGE:  10/02/2010                              DISCHARGE SUMMARY   DISCHARGE DIAGNOSES: 1. Chronic obstructive pulmonary disease exacerbation. 2. Question of pneumonia with infiltrate on chest x-ray. 3. History of coronary artery disease status post stenting. 4. History of hypertension. 5. History of paroxysmal atrial fibrillation. 6. History of hyperlipidemia. 7. History of obstructive sleep apnea on CPAP. 8. Ongoing tobacco use. 9. Hyperglycemia most likely secondary to steroid treatment,     hemoglobin A1c of 6.1.  DISCHARGE MEDICATIONS: 1. Avelox 400 mg daily for 7 days. 2. Albuterol inhaler 1-2 puffs every 6 hours as needed. 3. Aspirin 81 mg daily. 4. Bystolic 5 mg every evening. 5. Calcium carbonate daily. 6. Diltiazem 120 mg every evening. 7. Fish oil 1000 mg daily. 8. Isosorbide mononitrate XR 60 mg every evening. 9. Losartan 100 mg daily. 10.Multivitamin daily. 11.Nitroglycerin 0.4 mg sublingual every 5 minutes as needed. 12.Protonix 40 mg daily. 13.Plavix 75 mg daily. 14.Prednisone taper. 15.Crestor 20 mg daily. 16.Spiriva 18 mcg daily. 17.Symbicort twice daily. 18.Vitamin D3 2000 units daily.  FOLLOWUP APPOINTMENTS:  The patient is to follow up with Dr. Shelle [name] his pulmonologist in 1-2 weeks.  The patient was discharged on home oxygen.  PROCEDURES:  Chest x-ray, generalized hyperinflation configuration consistent with a history of COPD.  There is a central peribronchial thickening.  He has minimal atelectasis and infiltrative density in left base without consultation.  CONSULTANTS:  None.  HISTORY OF PRESENT ILLNESS:  The patient is a 70 year old male with known history of COPD with ongoing  tobacco use, history of coronary artery disease status post stenting, history of hypertension, hyperlipidemia, obstructive sleep apnea, on CPAP, hypertension, paroxysmal AFib, has been experiencing some shortness of breath over the last 3 days which has been worsening.  The patient did go to his pulmonologist yesterday for routine visit and was given prednisone taper dose along with a Z-Pak despite which his symptoms got worse.  He has come to the ER, in the ER, the patient had a chest x-ray, cardiac markers although did not show anything acute at the time, but repeat chest x-ray showed infiltrate.  The patient was still short of breath despite getting nebulizers, have been admitted for further workup.  Please see admission note for remainder of history, past medical history, family history, social history, meds, allergies, review of systems as per admission H and P.  PHYSICAL EXAM:  VITAL SIGNS:  At the time of discharge, temperature 97.9, pulse of 80, respirations 18, blood pressure 160/73, pulse ox 99% on 2 liters. GENERAL:  The patient is sitting up in bed, well-nourished white male. HEENT:  Normocephalic, atraumatic.  Pupils are reactive to light, but without erythema. CARDIOVASCULAR:  Regular rate and rhythm. LUNGS:  Clear bilaterally.  No wheezes, rhonchi, or rhythm. ABDOMEN:  Positive bowel sounds. EXTREMITIES:  Without edema.  HOSPITAL COURSE: 1. COPD exacerbation/pneumonia.  The patient was admitted to  the     hospital, treated initially for COPD exacerbation as his chest x-     ray was negative, it only showed COPD.  He had a repeat chest x-ray     that showed infiltrate.  He was treated with antibiotic, neb     treatments, and steroid taper.  The patient will be discharged home     with antibiotic nebs and steroid and his other COPD meds.  He will     follow up with Dr. Shelle [name] in the office within a week. 2. Coronary artery disease.  He was chest pain-free while he  was     inpatient. 3. Tobacco use.  The patient was counseled against smoking cessation.     He was told not to smoke while he is on oxygen, especially with     oxygen tank, risk of causing fire if he does and harmed himself. 4. Hypertension.  He was continued on his home medication and that can     be adjusted when he follows up outpatient with his PCP.  DISCHARGE LABS:  CPK 73.  Troponin less than 0.30, magnesium 2.3. Sodium 144, potassium 4.6, chloride 106, CO2 30, glucose 161, BUN 22, creatinine 1.08.  WBC 10, hemoglobin 13.9, MCV 91.5, platelets 183. Hemoglobin A1c 6.1.  Time spent with the patient and doing this note is approximately 45 minutes.     Ladell Pier, M.D.    N[place]/MEDQ  D:  10/02/2010  T:  10/02/2010  Job:  [id number]  Electronically Signed by Ladell Pier M.D. on 10/06/2010 10:29:45 PM

## 2010-10-06 NOTE — Assessment & Plan Note (Signed)
The patient is status post recent acute exacerbation that required hospitalization.  I've explained to him this is due to ongoing airway inflammation from his smoking, and he can expect this to be a recurring problem if he does not quit.  I have asked him to finish up his prednisone and antibiotics from discharge, and to continue with his maintenance bronchodilator regimen.  I would like for him to stay on oxygen until his next visit where we will reassess his levels.  Again, I stressed to him the importance of total smoking cessation.

## 2010-10-06 NOTE — Patient Instructions (Signed)
Finish up antibiotics and prednisone taper Continue with current inhalers. You must stop smoking!! Will leave on oxygen until next visit.  Will check on you again in 3 weeks.  Please remove your oxygen once you arrive in waiting room so we can check your levels.

## 2010-10-06 NOTE — Progress Notes (Signed)
  Subjective:    Patient ID: [name], male    DOB: [birth year], 70 y.o.   MRN: [id number]  HPI The patient comes in today for followup after a recent hospitalization for acute exacerbation.  He was treated with IV steroids and antibiotics, and discharged on oral medications.  He is continuing on his usual bronchodilator regimen, but unfortunately continues to smoke.  He was also discharged on oxygen that he is currently wearing.  His cough and chest congestion has significantly improved, and he is no longer bringing up purulent mucus.  He feels that his breathing is much improved since discharge, and is asking about his oxygen therapy.   Review of Systems  Constitutional: Negative for fever and unexpected weight change.  HENT: Negative for ear pain, nosebleeds, congestion, sore throat, rhinorrhea, sneezing, trouble swallowing, dental problem, postnasal drip and sinus pressure.   Eyes: Negative for redness and itching.  Respiratory: Positive for cough, chest tightness, shortness of breath and wheezing.   Cardiovascular: Negative for palpitations and leg swelling.  Gastrointestinal: Negative for nausea and vomiting.  Genitourinary: Negative for dysuria.  Musculoskeletal: Negative for joint swelling.  Skin: Negative for rash.  Neurological: Negative for headaches.  Hematological: Bruises/bleeds easily.  Psychiatric/Behavioral: Negative for dysphoric mood. The patient is not nervous/anxious.        Objective:   Physical Exam Well-developed male in no acute distress Nose without purulence or discharge noted Chest with decreased breath sounds, but no wheezes or rhonchi Cardiac exam with regular rate and rhythm Lower extremities without edema, no cyanosis Alert and oriented, moves all 4 extremities.       Assessment & Plan:

## 2010-10-26 ENCOUNTER — Ambulatory Visit: Payer: Medicare Other | Admitting: Pulmonary Disease

## 2010-10-27 ENCOUNTER — Encounter: Payer: Self-pay | Admitting: Pulmonary Disease

## 2010-10-27 ENCOUNTER — Ambulatory Visit (INDEPENDENT_AMBULATORY_CARE_PROVIDER_SITE_OTHER): Payer: Medicare Other | Admitting: Pulmonary Disease

## 2010-10-27 VITALS — BP 138/68 | HR 71 | Temp 97.6°F | Ht 72.0 in | Wt 210.0 lb

## 2010-10-27 DIAGNOSIS — J438 Other emphysema: Secondary | ICD-10-CM

## 2010-10-27 NOTE — Progress Notes (Signed)
  Subjective:    Patient ID: [name], male    DOB: 1941/01/15, 70 y.o.   MRN: [id number]  HPI Patient comes in today for followup of his known emphysema.  He was recently in the hospital for an acute desaturation, but now feels that he is returned to baseline.  He is continuing on his bronchodilator regimen, and needs to be assessed today for whether oxygen needs to be continued.  He denies any significant cough or chest congestion.  He feels that his exertional tolerance is near baseline.  Unfortunately, he is continuing to smoke.   Review of Systems  Constitutional: Negative for fever and unexpected weight change.  HENT: Positive for rhinorrhea. Negative for ear pain, nosebleeds, congestion, sore throat, sneezing, trouble swallowing, dental problem, postnasal drip and sinus pressure.   Eyes: Negative for redness and itching.  Respiratory: Positive for cough. Negative for chest tightness, shortness of breath and wheezing.   Cardiovascular: Positive for leg swelling. Negative for palpitations.  Gastrointestinal: Negative for nausea and vomiting.  Genitourinary: Negative for dysuria.  Musculoskeletal: Negative for joint swelling.  Skin: Negative for rash.  Neurological: Negative for headaches.  Hematological: Bruises/bleeds easily.  Psychiatric/Behavioral: Negative for dysphoric mood. The patient is not nervous/anxious.        Objective:   Physical Exam Overweight male in no acute distress Nose without purulence or discharge noted Chest with mild decrease in breath sounds, no wheezes or rhonchi Cardiac exam with regular rate and rhythm Lower extremities with minimal ankle edema, no cyanosis Alert and oriented, moves all 4 extremities.       Assessment & Plan:

## 2010-10-27 NOTE — Patient Instructions (Addendum)
You must stop smoking.  Consider classes at the cancer center as we discussed. Continue on current breathing meds, work on weight loss and conditioning. Will send an order to your equipment company to discontinue your oxygen.  followup with me in 4mos.

## 2010-10-27 NOTE — Assessment & Plan Note (Addendum)
The patient has known severe emphysema, and at least is getting back to his normal baseline after a recent acute exacerbation.  He denies any significant cough or congestion at this time.  His sats at rest and with activity are ok today off oxygen. I have asked him to continue on his current BD regimen, and he must quit smoking.  Also encouraged exercise and weight loss programs.

## 2010-11-30 ENCOUNTER — Ambulatory Visit: Payer: Medicare Other | Admitting: Pulmonary Disease

## 2011-02-28 ENCOUNTER — Encounter: Payer: Self-pay | Admitting: Pulmonary Disease

## 2011-02-28 ENCOUNTER — Ambulatory Visit (INDEPENDENT_AMBULATORY_CARE_PROVIDER_SITE_OTHER): Payer: Medicare Other | Admitting: Pulmonary Disease

## 2011-02-28 VITALS — BP 122/72 | HR 70 | Temp 97.9°F | Ht 72.0 in | Wt 211.2 lb

## 2011-02-28 DIAGNOSIS — J438 Other emphysema: Secondary | ICD-10-CM

## 2011-02-28 NOTE — Progress Notes (Signed)
  Subjective:    Patient ID: [name], male    DOB: 07-30-1940, 71 y.o.   MRN: [id number]  HPI The patient comes in today for followup of his known COPD.  He is continuing on his usual bronchodilator regimen, and has not had an acute exacerbation since last visit.  He feels that his breathing is at his usual baseline, but unfortunately is continuing to smoke.  I discussed smoking cessation with him again.   Review of Systems  Constitutional: Negative for fever and unexpected weight change.  HENT: Positive for congestion, rhinorrhea and postnasal drip. Negative for ear pain, nosebleeds, sore throat, sneezing, trouble swallowing, dental problem and sinus pressure.   Eyes: Negative for redness and itching.  Respiratory: Positive for cough, shortness of breath and wheezing. Negative for chest tightness.   Cardiovascular: Positive for leg swelling. Negative for palpitations.  Gastrointestinal: Negative for nausea and vomiting.  Genitourinary: Negative for dysuria.  Musculoskeletal: Negative for joint swelling.  Skin: Negative for rash.  Neurological: Negative for headaches.  Hematological: Bruises/bleeds easily.  Psychiatric/Behavioral: Negative for dysphoric mood. The patient is not nervous/anxious.        Objective:   Physical Exam Overweight male in no acute distress Nose without purulence or discharge noted Chest with decreased breath sounds throughout, no wheezes or rhonchi Cardiac exam with regular rate and rhythm Lower extremities without edema, no cyanosis noted Alert and oriented, moves all 4 extremities.       Assessment & Plan:

## 2011-02-28 NOTE — Patient Instructions (Signed)
No change in meds.  Stop smoking!! followup with me in 4-51mos if doing well.

## 2011-02-28 NOTE — Assessment & Plan Note (Signed)
The patient is at his usual baseline from a pulmonary standpoint.  I have encouraged him to quit smoking, and asked him to continue on his current bronchodilator regimen.  Also encouraged him to work on weight loss and some type of exercise program.  He is to followup with me in 4-6 months.

## 2011-03-22 ENCOUNTER — Ambulatory Visit: Payer: Medicare Other | Admitting: Internal Medicine

## 2011-03-22 ENCOUNTER — Ambulatory Visit: Payer: Medicare Other | Admitting: Pulmonary Disease

## 2011-04-28 ENCOUNTER — Telehealth: Payer: Self-pay | Admitting: Pulmonary Disease

## 2011-04-28 NOTE — Telephone Encounter (Signed)
Spoke with pt and he states having extreme SOB with sats in the low 80's the past couple of hours. I advised go to ER asap. Pt verbalized understanding.

## 2011-05-25 ENCOUNTER — Encounter: Payer: Self-pay | Admitting: Pulmonary Disease

## 2011-05-25 ENCOUNTER — Ambulatory Visit (INDEPENDENT_AMBULATORY_CARE_PROVIDER_SITE_OTHER): Payer: Medicare Other | Admitting: Pulmonary Disease

## 2011-05-25 VITALS — BP 146/62 | HR 75 | Temp 97.8°F | Ht 72.0 in | Wt 211.4 lb

## 2011-05-25 DIAGNOSIS — J441 Chronic obstructive pulmonary disease with (acute) exacerbation: Secondary | ICD-10-CM

## 2011-05-25 MED ORDER — PREDNISONE 10 MG PO TABS: ORAL_TABLET | ORAL | Status: DC

## 2011-05-25 NOTE — Assessment & Plan Note (Signed)
The patient's history is most consistent with a mild COPD exacerbation.  He has recently improved, but he is not back to baseline.  He is scheduled to go out of town soon and is concerned about his ongoing symptoms.  I think he would benefit from a short course of prednisone to get him through this, but I have also stressed the importance of total smoking cessation to keep this from happening again.

## 2011-05-25 NOTE — Progress Notes (Signed)
Addended by: Salli Quarry on: 05/25/2011 11:57 AM   Modules accepted: Orders

## 2011-05-25 NOTE — Progress Notes (Signed)
  Subjective:    Patient ID: [name], male    DOB: Sep 02, 1940, 71 y.o.   MRN: [id number]  HPI The patient comes in today for an acute sick visit.  He has known severe obstructive airways disease, and unfortunately continues to smoke.  He has maintained on an aggressive bronchodilator regimen.  He gives a two-week history of worsening shortness of breath, chest congestion, but no increase in cough or mucus.  He has been using his rescue inhaler, and has gotten a little bit better most recently.  He and his wife feel that he has not gotten back to baseline.   Review of Systems  Constitutional: Negative for fever and unexpected weight change.  HENT: Negative for ear pain, nosebleeds, congestion, sore throat, rhinorrhea, sneezing, trouble swallowing, dental problem, postnasal drip and sinus pressure.   Eyes: Negative for redness and itching.  Respiratory: Positive for cough, shortness of breath and wheezing. Negative for chest tightness.   Cardiovascular: Negative for palpitations and leg swelling.  Gastrointestinal: Negative for nausea and vomiting.  Genitourinary: Negative for dysuria.  Musculoskeletal: Negative for joint swelling.  Skin: Negative for rash.  Neurological: Negative for headaches.  Hematological: Does not bruise/bleed easily.  Psychiatric/Behavioral: Negative for dysphoric mood. The patient is not nervous/anxious.        Objective:   Physical Exam Overweight male in no acute distress Nose without purulence or discharge noted Chest with decreased breath sounds throughout, a few rhonchi, no wheezing Cardiac exam with regular rate and rhythm Lower extremities with mild ankle edema, no cyanosis Alert and oriented, moves all 4 extremities.       Assessment & Plan:

## 2011-05-25 NOTE — Patient Instructions (Signed)
Will treat with a short course of prednisone to get you back to your baseline No change in breathing medications Stop smoking! Keep your usual followup apptm with me.

## 2011-06-03 ENCOUNTER — Telehealth: Payer: Self-pay | Admitting: Pulmonary Disease

## 2011-06-03 NOTE — Telephone Encounter (Signed)
[email or web address]  

## 2011-06-03 NOTE — Telephone Encounter (Signed)
Spoke with pt. He states that he had finished the pred taper from 05-25-11 ov and was feeling well, breathing was back at baseline. A couple of days ago he spent a lot of time outside, and since then his breathing has worsened with exertion. He states that he also has noticed increased cough since this am, prod with minimal sputum, he does not know the color. Denies fever, CP, wheeze or any other co's. Would like something called in. Please advise, thanks!

## 2011-06-03 NOTE — Telephone Encounter (Signed)
The cough may just be due to allergies. Would like him to try chlorpheniramine otc 8mg  at bedtime to see if helps with cough. His ongoing smoking also continues to drive his cough.  Needs to quit.

## 2011-06-06 NOTE — Telephone Encounter (Signed)
Pt notified of [facility] recs and will call if the cough does not improve.

## 2011-07-04 ENCOUNTER — Ambulatory Visit: Payer: Medicare Other | Admitting: Pulmonary Disease

## 2011-07-05 ENCOUNTER — Ambulatory Visit (INDEPENDENT_AMBULATORY_CARE_PROVIDER_SITE_OTHER): Payer: Medicare Other | Admitting: Pulmonary Disease

## 2011-07-05 ENCOUNTER — Encounter: Payer: Self-pay | Admitting: Pulmonary Disease

## 2011-07-05 VITALS — BP 158/76 | HR 71 | Temp 97.4°F | Ht 71.0 in | Wt 213.2 lb

## 2011-07-05 DIAGNOSIS — J449 Chronic obstructive pulmonary disease, unspecified: Secondary | ICD-10-CM

## 2011-07-05 NOTE — Progress Notes (Signed)
  Subjective:    Patient ID: [name], male    DOB: Mar 03, 1940, 71 y.o.   MRN: [id number]  HPI Patient comes in today for followup of his known COPD.  He's been having recurrent exacerbations despite an excellent medical regimen, however he continues to smoke.  I have counseled him at [facility] about stopping smoking, but he continues to do so.  He feels that his breathing is near his usual baseline, but is having issues with allergies.  I had recommended an over-the-counter antihistamine at a recent telephone call.  He feels that his dyspnea on exertion is continuing, but he is not having an acute flareup.  His oxygen level decreased to 86% today just walking from the waiting room.   Review of Systems  Constitutional: Negative.  Negative for fever and unexpected weight change.  HENT: Negative.  Negative for ear pain, nosebleeds, congestion, sore throat, rhinorrhea, sneezing, trouble swallowing, dental problem, postnasal drip and sinus pressure.   Eyes: Negative.  Negative for redness and itching.  Respiratory: Positive for shortness of breath. Negative for cough, chest tightness and wheezing.   Cardiovascular: Negative.  Negative for palpitations and leg swelling.  Gastrointestinal: Negative.  Negative for nausea and vomiting.  Genitourinary: Negative.  Negative for dysuria.  Musculoskeletal: Negative.  Negative for joint swelling.  Skin: Negative.  Negative for rash.  Neurological: Negative.  Negative for headaches.  Hematological: Negative.  Does not bruise/bleed easily.  Psychiatric/Behavioral: Negative.  Negative for dysphoric mood. The patient is not nervous/anxious.        Objective:   Physical Exam Overweight male in no acute distress Nose without purulence or discharge noted Chest with decreased breath sounds, mild basilar crackles, no wheezing Cardiac exam with regular rate and rhythm Lower extremities with no significant edema, no cyanosis Alert and oriented, moves all 4  extremities.       Assessment & Plan:

## 2011-07-05 NOTE — Patient Instructions (Signed)
Will check oxygen level overnight wearing cpap to see if you need oxygen while sleeping as well. Will start you on oxygen with exertion at 2 liters.  You do not need at rest. No change in medications. You must stop smoking or this will continue to worsen followup with me in 4mos.

## 2011-07-05 NOTE — Assessment & Plan Note (Signed)
The patient has severe emphysema, and unfortunately continues to smoke.  He is on a very aggressive regimen, but continues to have acute exacerbations because of his smoking.  He now is having significant desaturation with minimal walking, and therefore will need to start on oxygen.  I have asked him to stay on his current medications, but stressed to him that he must stop smoking.  We'll go ahead and start on oxygen with exertion, and also check an overnight oximetry to make sure he doesn't need at night.

## 2011-07-13 ENCOUNTER — Telehealth: Payer: Self-pay | Admitting: Pulmonary Disease

## 2011-07-13 ENCOUNTER — Other Ambulatory Visit: Payer: Self-pay | Admitting: Pulmonary Disease

## 2011-07-13 DIAGNOSIS — J449 Chronic obstructive pulmonary disease, unspecified: Secondary | ICD-10-CM

## 2011-07-13 NOTE — Telephone Encounter (Signed)
I called [facility] and spoke with Irving Burton because an order was sent for the pt on 07-05-11 to eval for portable oxygen. She states they have spoken with the pt and advised the process of submitting order to insurance and once it was approved they could schedule a resp therapist to come out to his home to qualify him for portable. Per [employer] approval came in today so they will be contacting him shortly to schedule visit. Nothing is needed from [place] at this time. I have [id number] to advise the pt of this and see what questions he may have. Carron Curie, CMA

## 2011-07-13 NOTE — Telephone Encounter (Signed)
Pt advised of below, that [facility] will be contacting him to set appt. Carron Curie, CMA

## 2011-07-13 NOTE — Telephone Encounter (Signed)
Patient returning call.

## 2011-07-25 ENCOUNTER — Encounter: Payer: Self-pay | Admitting: Pulmonary Disease

## 2011-07-25 ENCOUNTER — Telehealth: Payer: Self-pay | Admitting: Pulmonary Disease

## 2011-07-25 NOTE — Telephone Encounter (Signed)
I spoke with pt and he states he still has not heard from [facility]. I spoke with [facility] and was told they are still awaiting approval from insurance. They are going to call pt today. I made pt aware of this and he needed nothing further

## 2011-10-21 ENCOUNTER — Encounter: Payer: Self-pay | Admitting: Adult Health

## 2011-10-21 ENCOUNTER — Ambulatory Visit (INDEPENDENT_AMBULATORY_CARE_PROVIDER_SITE_OTHER): Payer: Medicare Other | Admitting: Adult Health

## 2011-10-21 ENCOUNTER — Telehealth: Payer: Self-pay | Admitting: Pulmonary Disease

## 2011-10-21 ENCOUNTER — Ambulatory Visit (INDEPENDENT_AMBULATORY_CARE_PROVIDER_SITE_OTHER)
Admission: RE | Admit: 2011-10-21 | Discharge: 2011-10-21 | Disposition: A | Discharge status: Home / Self Care | Payer: Medicare Other | Source: Ambulatory Visit | Attending: Adult Health | Admitting: Adult Health

## 2011-10-21 VITALS — BP 104/58 | HR 73 | Temp 97.0°F | Ht 71.0 in | Wt 202.4 lb

## 2011-10-21 DIAGNOSIS — J4489 Other specified chronic obstructive pulmonary disease: Secondary | ICD-10-CM

## 2011-10-21 DIAGNOSIS — J449 Chronic obstructive pulmonary disease, unspecified: Secondary | ICD-10-CM

## 2011-10-21 MED ORDER — PREDNISONE 10 MG PO TABS: ORAL_TABLET | ORAL | Status: DC

## 2011-10-21 MED ORDER — LEVALBUTEROL HCL 0.63 MG/3ML IN NEBU
0.6300 mg | INHALATION_SOLUTION | Freq: Once | RESPIRATORY_TRACT | Status: AC
  Administered 2011-10-21: 0.63 mg via RESPIRATORY_TRACT

## 2011-10-21 MED ORDER — MOXIFLOXACIN HCL 400 MG PO TABS: 400.0000 mg | ORAL_TABLET | Freq: Every day | ORAL | Status: DC

## 2011-10-21 NOTE — Addendum Note (Signed)
Addended by: Boone Master E on: 10/21/2011 12:21 PM   Modules accepted: Orders

## 2011-10-21 NOTE — Assessment & Plan Note (Signed)
Advised smoking cessation  Exacerbation  cxr today  xopenex neb   Plan'  Avelox 400mg  daily for 7 days  Prednisone taper over next week  Mucinex DM Twice daily  As needed  Cough/congestion  Fluids and rest  NO SMOKING Please contact office for sooner follow up if symptoms do not improve or worsen or seek emergency care  follow up Dr. Shelle Iron in 6 weeks and As needed   Advised to wear oxygen with activity and at bedtime

## 2011-10-21 NOTE — Telephone Encounter (Signed)
lmomtcb x1 

## 2011-10-21 NOTE — Telephone Encounter (Signed)
Spoke with pt's wife.  Reports pt having a lot chest congestion, prod cough (unsure of color of mucus), wheezing, and chest tightness.  States pt was up all night coughing.  Denies increased SOB.  Symptoms started 2 days ago.  Requesting OV this am -- scheduled with TP at 10:30 am today.  Wife aware.

## 2011-10-21 NOTE — Patient Instructions (Addendum)
Avelox 400mg  daily for 7 days  Prednisone taper over next week  Mucinex DM Twice daily  As needed  Cough/congestion  Fluids and rest  NO SMOKING Please contact office for sooner follow up if symptoms do not improve or worsen or seek emergency care  follow up Dr. Shelle Iron in 2 weeks as planned and As needed

## 2011-10-21 NOTE — Progress Notes (Signed)
  Subjective:    Patient ID: [name], male    DOB: February 24, 1940, 71 y.o.   MRN: [id number]  HPI 71 yo WM active smoker with known hx of COPD /OSA on O2 As needed  /QHS  /CPAP   10/21/2011 ACute OV  Patient presents for an acute office visit. He complains of 3 days of cough, congestion, wheeze, and increased shortness of breath with activity. He denies any hemoptysis, chest pain, abdominal pain, nausea, vomiting, or leg swelling. Has not used any over-the-counter medicines for treatment. He continues to smoke approximately one pack of cigarettes a day. He says this is down from his normal 2 packs a day. He is wearing his oxygen as needed and at bedtime along with his CPAP for sleep apnea. Patient was advised on smoking cessation is keen to his improvement  Review of Systems Constitutional:   No  weight loss, night sweats,  Fevers, chills, fatigue, or  lassitude.  HEENT:   No headaches,  Difficulty swallowing,  Tooth/dental problems, or  Sore throat,                No sneezing, itching, ear ache, nasal congestion, post nasal drip,   CV:  No chest pain,  Orthopnea, PND, swelling in lower extremities, anasarca, dizziness, palpitations, syncope.   GI  No heartburn, indigestion, abdominal pain, nausea, vomiting, diarrhea, change in bowel habits, loss of appetite, bloody stools.   Resp:    No coughing up of blood.    No chest wall deformity  Skin: no rash or lesions.  GU: no dysuria, change in color of urine, no urgency or frequency.  No flank pain, no hematuria   MS:  No joint pain or swelling.  No decreased range of motion.  No back pain.  Psych:  No change in mood or affect. No depression or anxiety.  No memory loss.         Objective:   Physical Exam GEN: A/Ox3; pleasant , NAD chronically ill appearing   HEENT:  [place]/AT,  EACs-clear, TMs-wnl, NOSE-clear, THROAT-clear, no lesions, no postnasal drip or exudate noted.   NECK:  Supple w/ fair ROM; no JVD; normal carotid  impulses w/o bruits; no thyromegaly or nodules palpated; no lymphadenopathy.  RESP  Coarse BS w/ exp wheezing no accessory muscle use, no dullness to percussion  CARD:  RRR, no m/r/g  , no peripheral edema, pulses intact, no cyanosis or clubbing.  GI:   Soft & nt; nml bowel sounds; no organomegaly or masses detected.  Musco: Warm bil, no deformities or joint swelling noted.   Neuro: alert, no focal deficits noted.    Skin: Warm, no lesions or rashes         Assessment & Plan:

## 2011-10-24 NOTE — Progress Notes (Signed)
Quick Note:  Called spoke with patient, advised of cxr results / recs as stated by TP. Pt verbalized his understanding and denied any questions. Pt to keep 10.8.13 appt w/ KC/ ______

## 2011-11-08 ENCOUNTER — Encounter: Payer: Self-pay | Admitting: Pulmonary Disease

## 2011-11-08 ENCOUNTER — Ambulatory Visit (INDEPENDENT_AMBULATORY_CARE_PROVIDER_SITE_OTHER): Payer: Medicare Other | Admitting: Pulmonary Disease

## 2011-11-08 VITALS — BP 128/80 | HR 73 | Temp 97.6°F | Ht 72.0 in | Wt 208.0 lb

## 2011-11-08 DIAGNOSIS — J449 Chronic obstructive pulmonary disease, unspecified: Secondary | ICD-10-CM

## 2011-11-08 NOTE — Patient Instructions (Addendum)
No change in current medications. You must stop smoking if you want to stay well. Wear oxygen during sleep and with any sustained exertional activities.  Make sure you get your flu shot today.  followup with me in 4mos if doing well.

## 2011-11-08 NOTE — Assessment & Plan Note (Signed)
The patient is compliant with his medical regimen, but continues to have exacerbations because of ongoing smoking.  I have counseled him on this, and stressed to him again that he will not stay well if he continues to smoke.  I have also asked him to continue with oxygen and with exertional activities in order to improve his endurance, and also with sleep in order to prevent significant nocturnal desaturation.  Finally, I have encouraged him to work on weight loss and some type of conditioning program.

## 2011-11-08 NOTE — Progress Notes (Signed)
  Subjective:    Patient ID: [name], male    DOB: July 26, 1940, 70 y.o.   MRN: [id number]  HPI The patient comes in today for followup of his known COPD.  He has had a recent acute exacerbation a few weeks ago, and was treated by our nurse practitioner with antibiotics and prednisone.  Unfortunately, he continues to smoke, and I continue to counsel him on this.  He feels that his breathing has improved since his acute episode, but has not quite gotten back to his normal baseline.  He is compliant with his bronchodilator regimen.   Review of Systems  Constitutional: Negative for fever and unexpected weight change.  HENT: Negative for ear pain, nosebleeds, congestion, sore throat, rhinorrhea, sneezing, trouble swallowing, dental problem, postnasal drip and sinus pressure.   Eyes: Negative for redness and itching.  Respiratory: Positive for shortness of breath ( upon activity ). Negative for cough, chest tightness and wheezing.   Cardiovascular: Negative for palpitations and leg swelling.  Gastrointestinal: Negative for nausea and vomiting.  Genitourinary: Negative for dysuria.  Musculoskeletal: Negative for joint swelling.  Skin: Negative for rash.  Neurological: Negative for headaches.  Hematological: Bruises/bleeds easily.  Psychiatric/Behavioral: Negative for dysphoric mood. The patient is not nervous/anxious.        Objective:   Physical Exam Overweight male in no acute distress Nose without purulent discharge noted Chest with mild decrease in breath sounds, a few rhonchi, no wheezes or crackles Cardiac exam with regular rate and rhythm Lower extremities with minimal edema,no cyanosis Alert and oriented, moves all 4 extremities appear       Assessment & Plan:

## 2011-11-21 ENCOUNTER — Telehealth: Payer: Self-pay | Admitting: Pulmonary Disease

## 2011-11-21 NOTE — Telephone Encounter (Signed)
ATC NA and no option to leave a msg, WCB.  

## 2011-11-22 MED ORDER — PREDNISONE 10 MG PO TABS: ORAL_TABLET | ORAL | Status: DC

## 2011-11-22 NOTE — Telephone Encounter (Signed)
Spouse called back again. Call pt's cell # [phone]. Hazel Sams

## 2011-11-22 NOTE — Telephone Encounter (Signed)
I spoke with pt spouse and is aware of [facility] recs. She stated they have tired ti stress to him to stop smoking but he doesn;t listen. She is aware of directions for prednisone. I have sent this to the pharmacy. Nothing further was needed

## 2011-11-22 NOTE — Telephone Encounter (Signed)
Pt saw TP for acute visit on 9.20.13 and was given avelox 400mg  x7days.  Has seen KC since.    Called spoke with patient who reported that he is still having a lot of chest congestion and prod cough (does not know the color of the mucus) w/ wheezing, increased SOB, and tightness in chest.  Pt stated that there not much improvement with the avelox 400mg .  Denies f/c/s.  Dr Shelle Iron please advise, thanks. Allergies  Allergen Reactions  . Ranitidine Hcl     REACTION: throat swelling, difficulty breathing  . Beta Adrenergic Blockers     REACTION: sexual side effects  . Ramipril     REACTION: throat swelling  . Sulfonamide Derivatives     REACTION: childhood reation of hematuria  . Ticlopidine Hcl     REACTION: swelling  [facility] on [address] and [address].

## 2011-11-22 NOTE — Telephone Encounter (Signed)
The problem is that he continues to smoke.  If he does not quit, there is absolutely NO MEDICATION that will make him well.   This is a very important point to make with him. Can call in prednisone:  10mg  tabs Take 4 each day for 2 days, then 3 each day for 2 days, then 2 each day for 2 days, then one each day for 2 days, then stop.

## 2012-03-12 ENCOUNTER — Encounter: Payer: Self-pay | Admitting: Pulmonary Disease

## 2012-03-12 ENCOUNTER — Ambulatory Visit (INDEPENDENT_AMBULATORY_CARE_PROVIDER_SITE_OTHER): Payer: Medicare Other | Admitting: Pulmonary Disease

## 2012-03-12 VITALS — BP 138/70 | HR 70 | Temp 97.6°F | Ht 71.25 in | Wt 200.0 lb

## 2012-03-12 DIAGNOSIS — J449 Chronic obstructive pulmonary disease, unspecified: Secondary | ICD-10-CM

## 2012-03-12 NOTE — Patient Instructions (Addendum)
No change in breathing medications Continue to work on stopping smoking. Will have [facility] check your oxygen level at night on cpap and oxygen.  Will call you with results. Work on exercise program and weight loss followup with me in 4mos.

## 2012-03-12 NOTE — Progress Notes (Signed)
  Subjective:    Patient ID: [name], male    DOB: 03-Aug-1940, 72 y.o.   MRN: [id number]  HPI The patient comes in today for followup of his known COPD.  He is taking his medications compliantly, but unfortunately continues to smoke.  He has not had an acute exacerbation or pulmonary infection since his last visit.  He feels that his breathing is near his usual baseline.  He has noted that his saturations are in the mid 80s when he awakens in the mornings despite wearing oxygen with his CPAP.    Review of Systems  Constitutional: Negative for fever and unexpected weight change.  HENT: Positive for rhinorrhea. Negative for ear pain, nosebleeds, congestion, sore throat, sneezing, trouble swallowing, dental problem, postnasal drip and sinus pressure.   Eyes: Negative for redness and itching.  Respiratory: Positive for wheezing. Negative for cough, chest tightness and shortness of breath.   Cardiovascular: Negative for palpitations and leg swelling.  Gastrointestinal: Negative for nausea and vomiting.  Genitourinary: Negative for dysuria.  Musculoskeletal: Negative for joint swelling.  Skin: Negative for rash.  Neurological: Negative for headaches.  Hematological: Does not bruise/bleed easily.  Psychiatric/Behavioral: Negative for dysphoric mood. The patient is not nervous/anxious.        Objective:   Physical Exam Well-developed male in no acute distress Nose without purulent discharge noted Neck without lymphadenopathy or thyromegaly Chest with very diminished breath sounds throughout, no wheezing Cardiac exam with regular rate and rhythm Lower extremities without edema, cyanosis Alert and oriented, moves all 4 extremities.       Assessment & Plan:

## 2012-03-12 NOTE — Assessment & Plan Note (Signed)
The patient appears to be stable from a pulmonary standpoint since his last visit.  However I have stressed to him the importance of total smoking cessation.  He is going to try the electronic cigarette, but I have explained no one understands the safety of this particular device.  He is to work on total smoking cessation, and try to stay as active as possible.

## 2012-03-21 ENCOUNTER — Telehealth: Payer: Self-pay | Admitting: Pulmonary Disease

## 2012-03-21 NOTE — Telephone Encounter (Signed)
Synetta Fail from [place] to call back about this [name]

## 2012-03-22 NOTE — Telephone Encounter (Signed)
[name]  to deliver the pulse oc this afternoon pt is aware [name]

## 2012-03-26 ENCOUNTER — Telehealth: Payer: Self-pay | Admitting: Pulmonary Disease

## 2012-03-26 NOTE — Telephone Encounter (Signed)
Pt ONO results have been received and are in your [facility] [place] folder for your review.

## 2012-04-18 NOTE — Telephone Encounter (Signed)
Dr Shelle Iron, please advise on ONO results. In your green folder for review.

## 2012-04-20 ENCOUNTER — Telehealth: Payer: Self-pay | Admitting: Pulmonary Disease

## 2012-04-20 NOTE — Telephone Encounter (Signed)
Pt aware of results and recs per [facility]. Expressed understanding and will contact out office if anything further is needed.

## 2012-04-20 NOTE — Telephone Encounter (Signed)
Please let pt know that his oxygen level on cpap and oxygen look pretty good.  He had a little bit of a drop, and since he is already on oxygen, would increase flow to 2.5 liters during sleep.

## 2012-04-27 ENCOUNTER — Other Ambulatory Visit (HOSPITAL_COMMUNITY): Payer: Self-pay | Admitting: Cardiovascular Disease

## 2012-04-27 DIAGNOSIS — I4891 Unspecified atrial fibrillation: Secondary | ICD-10-CM

## 2012-05-07 ENCOUNTER — Ambulatory Visit (HOSPITAL_COMMUNITY)
Admission: RE | Admit: 2012-05-07 | Discharge: 2012-05-07 | Disposition: A | Discharge status: Home / Self Care | Payer: Medicare Other | Source: Ambulatory Visit | Attending: Cardiovascular Disease | Admitting: Cardiovascular Disease

## 2012-05-07 DIAGNOSIS — I4891 Unspecified atrial fibrillation: Secondary | ICD-10-CM | POA: Insufficient documentation

## 2012-05-07 NOTE — Progress Notes (Signed)
2D Echo Performed 05/07/2012    Viki Carrera, RCS  

## 2012-05-14 ENCOUNTER — Encounter: Payer: Self-pay | Admitting: Pulmonary Disease

## 2012-05-31 ENCOUNTER — Other Ambulatory Visit: Payer: Self-pay | Admitting: *Deleted

## 2012-06-01 ENCOUNTER — Ambulatory Visit
Admission: RE | Admit: 2012-06-01 | Discharge: 2012-06-01 | Disposition: A | Discharge status: Home / Self Care | Payer: Medicare Other | Source: Ambulatory Visit | Attending: Cardiovascular Disease | Admitting: Cardiovascular Disease

## 2012-06-01 ENCOUNTER — Other Ambulatory Visit: Payer: Self-pay | Admitting: Cardiovascular Disease

## 2012-06-01 DIAGNOSIS — I10 Essential (primary) hypertension: Secondary | ICD-10-CM

## 2012-06-01 DIAGNOSIS — Z01811 Encounter for preprocedural respiratory examination: Secondary | ICD-10-CM

## 2012-06-01 NOTE — Addendum Note (Signed)
Addended by: KELLY, THOMAS A on: 06/01/2012 07:19 AM   Modules accepted: Orders  

## 2012-06-06 MED ORDER — DEXTROSE-NACL 5-0.45 % IV SOLN: INTRAVENOUS | Status: DC

## 2012-06-07 ENCOUNTER — Encounter (HOSPITAL_COMMUNITY)
Admission: RE | Disposition: A | Discharge status: Home / Self Care | Payer: Self-pay | Source: Ambulatory Visit | Attending: Cardiovascular Disease

## 2012-06-07 ENCOUNTER — Ambulatory Visit (HOSPITAL_COMMUNITY): Payer: Medicare Other | Admitting: Anesthesiology

## 2012-06-07 ENCOUNTER — Encounter (HOSPITAL_COMMUNITY): Payer: Self-pay | Admitting: Anesthesiology

## 2012-06-07 ENCOUNTER — Ambulatory Visit (HOSPITAL_COMMUNITY)
Admission: RE | Admit: 2012-06-07 | Discharge: 2012-06-07 | Disposition: A | Discharge status: Home / Self Care | Payer: Medicare Other | Source: Ambulatory Visit | Attending: Cardiovascular Disease | Admitting: Cardiovascular Disease

## 2012-06-07 DIAGNOSIS — I10 Essential (primary) hypertension: Secondary | ICD-10-CM | POA: Insufficient documentation

## 2012-06-07 DIAGNOSIS — I517 Cardiomegaly: Secondary | ICD-10-CM | POA: Insufficient documentation

## 2012-06-07 DIAGNOSIS — G4730 Sleep apnea, unspecified: Secondary | ICD-10-CM | POA: Insufficient documentation

## 2012-06-07 DIAGNOSIS — K219 Gastro-esophageal reflux disease without esophagitis: Secondary | ICD-10-CM | POA: Insufficient documentation

## 2012-06-07 DIAGNOSIS — I491 Atrial premature depolarization: Secondary | ICD-10-CM | POA: Insufficient documentation

## 2012-06-07 DIAGNOSIS — I2510 Atherosclerotic heart disease of native coronary artery without angina pectoris: Secondary | ICD-10-CM | POA: Insufficient documentation

## 2012-06-07 DIAGNOSIS — J4489 Other specified chronic obstructive pulmonary disease: Secondary | ICD-10-CM | POA: Insufficient documentation

## 2012-06-07 DIAGNOSIS — J449 Chronic obstructive pulmonary disease, unspecified: Secondary | ICD-10-CM | POA: Insufficient documentation

## 2012-06-07 DIAGNOSIS — I739 Peripheral vascular disease, unspecified: Secondary | ICD-10-CM | POA: Insufficient documentation

## 2012-06-07 DIAGNOSIS — I4891 Unspecified atrial fibrillation: Secondary | ICD-10-CM | POA: Insufficient documentation

## 2012-06-07 HISTORY — PX: CARDIOVERSION: SHX1299

## 2012-06-07 SURGERY — CARDIOVERSION
Anesthesia: General | Wound class: Clean

## 2012-06-07 MED ORDER — LIDOCAINE HCL (CARDIAC) 20 MG/ML IV SOLN
INTRAVENOUS | Status: DC | PRN
  Administered 2012-06-07: 100 mg via INTRAVENOUS

## 2012-06-07 MED ORDER — SODIUM CHLORIDE 0.9 % IV SOLN
INTRAVENOUS | Status: DC | PRN
  Administered 2012-06-07: 11:00:00 via INTRAVENOUS

## 2012-06-07 MED ORDER — PROPOFOL 10 MG/ML IV BOLUS
INTRAVENOUS | Status: DC | PRN
  Administered 2012-06-07: 60 mg via INTRAVENOUS

## 2012-06-07 MED ORDER — PROMETHAZINE HCL 25 MG/ML IJ SOLN: 6.2500 mg | INTRAMUSCULAR | Status: DC | PRN

## 2012-06-07 MED ORDER — METOPROLOL TARTRATE 1 MG/ML IV SOLN
INTRAVENOUS | Status: DC | PRN
  Administered 2012-06-07: 2.5 mg via INTRAVENOUS

## 2012-06-07 NOTE — OR Nursing (Signed)
Talked with Dr. Tresa Endo about Patient missing his apixaban dose at 8 am this morning.  He stated he wanted to proceed with the Cardioversion.  Anesthesia called.

## 2012-06-07 NOTE — CV Procedure (Signed)
[facility]  CARDIOVERSION NOTE   Procedure: Electrical Cardioversion Indications:  Atrial Fibrillation  Procedure Details:  Consent: Risks of procedure as well as the alternatives and risks of each were explained to the (patient/caregiver).  Consent for procedure obtained.  Time Out: Verified patient identification, verified procedure, site/side was marked, verified correct patient position, special equipment/implants available, medications/allergies/relevent history reviewed, required imaging and test results available.  Performed  Baseline ECG: AF at 81 PRWP anteriorly and NSSTT changes  Patient placed on cardiac monitor, pulse oximetry, supplemental oxygen as necessary.  Sedation given: 60 mg propothol and 100 mg lidocaine per Dr Okey Dupre Pacer pads placed anterior and posterior chest.  Cardioverted 2 time(s).  Cardioverted at 120 and150J.  2.5 mg IV lopressor given due to frequent PAC's post cardioversion with stabilization of NSR.  Evaluation: Findings: Post procedure EKG shows: NSR Complications: None Patient did tolerate procedure well.  Time Spent Directly with the Patient:      Lennette Bihari, MD, [facility] 06/07/2012 11:44 AM

## 2012-06-07 NOTE — H&P (Signed)
  Updated H&P:   See complete dictated H&P from 05/30/2012. Pt now presents for elective outpatinet DC cardioversion for AF. He has been on Eliquis for 1 month. He has tolerated increased Bystolic dose. No change in PEx. Labs reviewed. Plan cardioversion. Pt aware of risks/benefits. KELLY,THOMAS A 06/07/2012

## 2012-06-07 NOTE — OR Nursing (Signed)
Pt somewhat sob, pt/family states baseline. sats 92%, was 93% at admit. Encouraged CDB, using inhalers upon arrival home and his PRN O2 if needed.

## 2012-06-07 NOTE — Preoperative (Signed)
Beta Blockers   Reason not to administer Beta Blockers:Not Applicable 

## 2012-06-07 NOTE — Anesthesia Preprocedure Evaluation (Addendum)
Anesthesia Evaluation  Patient identified by MRN, date of birth, ID band Patient awake    Reviewed: Allergy & Precautions, H&P , NPO status , Patient's Chart, lab work & pertinent test results, reviewed documented beta blocker date and time   History of Anesthesia Complications Negative for: history of anesthetic complications  Airway Mallampati: II TM Distance: >3 FB Neck ROM: Full    Dental no notable dental hx. (+) Teeth Intact and Dental Advisory Given   Pulmonary sleep apnea, Continuous Positive Airway Pressure Ventilation and Oxygen sleep apnea , COPDCurrent Smoker,  breath sounds clear to auscultation  Pulmonary exam normal       Cardiovascular hypertension, Pt. on medications and Pt. on home beta blockers + CAD and + Peripheral Vascular Disease + dysrhythmias Atrial Fibrillation Rhythm:Irregular Rate:Normal   was   moderate concentric hypertrophy. Systolic function was   normal. The estimated ejection fraction was in the range   of 55% to 60%. LV diastolic function cannot be assessed   due to atrial fibrillation. - Mitral valve: Structurally normal valve. No significant   regurgitation. - Left atrium: The atrium was at the upper limits of normal   in size. LA volume 26 ml/m2. - Right ventricle: The cavity size was normal. Wall   thickness was normal. The moderator band was prominent.   Systolic function was normal. - Right atrium: The atrium was mildly dilated. - Inferior vena cava: The vessel was normal in size; the   respirophasic diameter changes were in the normal range (=   50%); findings are consistent with normal central venous   pressure. Transthoracic echocardiography.  M-mode, complete 2D, spectral Doppler, and color Doppler.  Height:  Height: 180.3cm. Height: 71in.  Weight:  Weight: 93.9kg. Weight: 206.6lb.  Body mass index:  BMI: 28.9kg/m^2.  Body surface area:    BSA: 2.14m^2.  Blood pressure:     145/96.   Patient status:  Outpatient.  Location:  Echo laboratory.     Neuro/Psych negative neurological ROS  negative psych ROS   GI/Hepatic negative GI ROS, Neg liver ROS, GERD-  Medicated and Controlled,  Endo/Other  negative endocrine ROS  Renal/GU negative Renal ROS  negative genitourinary   Musculoskeletal negative musculoskeletal ROS (+)   Abdominal   Peds negative pediatric ROS (+)  Hematology negative hematology ROS (+)   Anesthesia Other Findings   Reproductive/Obstetrics negative OB ROS                        Anesthesia Physical Anesthesia Plan  ASA: III  Anesthesia Plan: General   Post-op Pain Management:    Induction: Intravenous  Airway Management Planned: Mask  Additional Equipment:   Intra-op Plan:   Post-operative Plan:   Informed Consent: I have reviewed the patients History and Physical, chart, labs and discussed the procedure including the risks, benefits and alternatives for the proposed anesthesia with the patient or authorized representative who has indicated his/her understanding and acceptance.   Dental advisory given  Plan Discussed with: CRNA and Surgeon  Anesthesia Plan Comments:         Anesthesia Quick Evaluation

## 2012-06-07 NOTE — Anesthesia Postprocedure Evaluation (Signed)
  Anesthesia Post-op Note  Patient: [name]  Procedure(s) Performed: Procedure(s): CARDIOVERSION (N/A)  Patient Location: Endoscopy Unit  Anesthesia Type:General  Level of Consciousness: awake, alert , oriented and patient cooperative  Airway and Oxygen Therapy: Patient Spontanous Breathing and Patient connected to nasal cannula oxygen  Post-op Pain: none  Post-op Assessment: Post-op Vital signs reviewed, Patient's Cardiovascular Status Stable, Respiratory Function Stable, Patent Airway and No signs of Nausea or vomiting  Post-op Vital Signs: Reviewed and stable  Complications: No apparent anesthesia complications

## 2012-06-07 NOTE — Transfer of Care (Addendum)
Immediate Anesthesia Transfer of Care Note  Patient: [name]  Procedure(s) Performed: Procedure(s): CARDIOVERSION (N/A)  Patient Location: Endoscopy Unit  Anesthesia Type:General  Level of Consciousness: awake, alert , oriented and patient cooperative  Airway & Oxygen Therapy: Patient Spontanous Breathing and Patient connected to nasal cannula oxygen  Post-op Assessment: Report given to PACU RN and Post -op Vital signs reviewed and stable  Post vital signs: Reviewed and stable  Complications: No apparent anesthesia complications

## 2012-06-08 ENCOUNTER — Encounter (HOSPITAL_COMMUNITY): Payer: Self-pay | Admitting: Cardiovascular Disease

## 2012-06-28 ENCOUNTER — Encounter: Payer: Self-pay | Admitting: *Deleted

## 2012-06-29 ENCOUNTER — Ambulatory Visit (INDEPENDENT_AMBULATORY_CARE_PROVIDER_SITE_OTHER): Payer: Medicare Other | Admitting: Cardiovascular Disease

## 2012-06-29 ENCOUNTER — Encounter: Payer: Self-pay | Admitting: Cardiovascular Disease

## 2012-06-29 VITALS — BP 120/74 | HR 76 | Ht 72.0 in | Wt 199.2 lb

## 2012-06-29 DIAGNOSIS — E785 Hyperlipidemia, unspecified: Secondary | ICD-10-CM

## 2012-06-29 DIAGNOSIS — F17200 Nicotine dependence, unspecified, uncomplicated: Secondary | ICD-10-CM

## 2012-06-29 DIAGNOSIS — I2510 Atherosclerotic heart disease of native coronary artery without angina pectoris: Secondary | ICD-10-CM

## 2012-06-29 DIAGNOSIS — J449 Chronic obstructive pulmonary disease, unspecified: Secondary | ICD-10-CM

## 2012-06-29 DIAGNOSIS — E669 Obesity, unspecified: Secondary | ICD-10-CM

## 2012-06-29 DIAGNOSIS — I10 Essential (primary) hypertension: Secondary | ICD-10-CM

## 2012-06-29 DIAGNOSIS — I4891 Unspecified atrial fibrillation: Secondary | ICD-10-CM

## 2012-06-29 MED ORDER — DRONEDARONE HCL 400 MG PO TABS: 400.0000 mg | ORAL_TABLET | Freq: Two times a day (BID) | ORAL | Status: DC

## 2012-06-29 NOTE — Patient Instructions (Signed)
Your physician recommends that you schedule a follow-up appointment in: 4 weeks.  Your physician has recommended you make the following change in your medication: Multaq 400 mg was added to your medications. This has been sent to your pharmacy.

## 2012-06-29 NOTE — Progress Notes (Signed)
Patient ID: [name], male   DOB: 1940-09-30, 72 y.o.   MRN: [id number]  HPI: [name], is a 72 y.o. male presents to the office today for cardiologic followup since undergoing outpatient DC Cardiversion for A Fib on 06/07/12. I last saw him in the office 1 month ago, at which time he was in atrial fibrillation of questionable duration. The patient has known CAD and underwent initial stenting of his right coronary artery in 2003 by Dr. Aleen Campi. He established care with me in 2011. At that time, because of increased symptoms, cardiac catheterization revealed total occlusion of the RCA within a perviously stented segment and I was able to successfully reopen his RCA and also perform PTCA distal to his previous stents. He also had concomitant CAD of his LAD and circumflex vessel and also has documented infrarenal abdominal aortic aneurysm with last Doppler measurement at 3.2 x 3.05 cm in greatest diameter. He also has a history of continued tobacco smoking, hypertension, hyperlipidemia, as well as sleep apnea for which he sees Dr. Shelle Iron. Apparently, he also uses 3 liters of supplemental oxygen at nighttime. When I saw him on April 27, 2012, EKG suggested atrial fibrillation with rates in the 100-110 range. At that time, I further increased his Bystolic for improved rate control from 10 to 15 mg and due to his peripheral edema, reduced the diltiazem down to 120 mg. I started him on Eliquis 5 mg b.i.d. I also gave him a prescription for HCTZ to help with his peripheral edema. He underwent a 2D Echo-Doppler study which showed an ejection fraction of 55% to 60%. Left atrium was upper normal with LA volume of 26 mL/meter squared. Right atrium was mildly dilated. Estimated RV systolic pressure was 21 mm. I had increased his Bystolic to 20 at that office visit. On 06/07/2012 he underwent cardioversion and required countershock x2 with restoration to sinus rhythm. He did have frequent PACs initially following  cardioversion and this stabilized with IV Lopressor 2.5 mg.   [name] is not noticed any change in his cardiac rhythm. However, he was not aware that he was in an irregular rhythm and atrial fibrillation previously.  Unfortunately, [name] continues to smoke cigarettes at least one pack per day. From a cardiac standpoint, he denies recent chest pain. He does note some mild shortness of breath. He is unaware of tachy-palpitations.      Past Medical History  Diagnosis Date  . COPD (chronic obstructive pulmonary disease)   . Active smoker   . Sleep apnea     managed by Dr. Renne Crigler  . Obesity   . Coronary artery disease   . Benign neoplasm of colon   . Atrial fibrillation   . Pyelonephritis, unspecified   . Other and unspecified hyperlipidemia   . Personal history of peptic ulcer disease   . Elevated prostate specific antigen (PSA)   . Diaphragmatic hernia without mention of obstruction or gangrene   . Esophageal reflux   . Tobacco use disorder   . Unspecified essential hypertension   . Peripheral vascular disease     Past Surgical History  Procedure Laterality Date  . Coronary artery stent  2003  . Cardioversion N/A 06/07/2012    Procedure: CARDIOVERSION;  Surgeon: Lennette Bihari, MD;  Location: [facility] ENDOSCOPY;  Service: Cardiovascular;  Laterality: N/A;    Allergies  Allergen Reactions  . Ranitidine Hcl     REACTION: throat swelling, difficulty breathing  . Beta Adrenergic Blockers  REACTION: sexual side effects  . Ramipril     REACTION: throat swelling  . Sulfonamide Derivatives     REACTION: childhood reation of hematuria  . Ticlopidine Hcl     REACTION: swelling    Current Outpatient Prescriptions  Medication Sig Dispense Refill  . albuterol (PROVENTIL HFA;VENTOLIN HFA) 108 (90 BASE) MCG/ACT inhaler Inhale 2 puffs into the lungs every 6 (six) hours as needed.      Marland Kitchen apixaban (ELIQUIS) 5 MG TABS tablet Take 5 mg by mouth 2 (two) times daily.      Marland Kitchen aspirin 81  MG tablet Take 81 mg by mouth daily.        Marland Kitchen atorvastatin (LIPITOR) 40 MG tablet 1 by mouth at bedtime      . budesonide-formoterol (SYMBICORT) 160-4.5 MCG/ACT inhaler Inhale 2 puffs into the lungs 2 (two) times daily.      . Cholecalciferol (VITAMIN D) 2000 UNITS CAPS Take 1 capsule by mouth daily.        Marland Kitchen diltiazem (CARDIZEM) 120 MG tablet Take 120 mg by mouth daily.        Marland Kitchen dronedarone (MULTAQ) 400 MG tablet Take 1 tablet (400 mg total) by mouth 2 (two) times daily with a meal.  60 tablet  6  . fish oil-omega-3 fatty acids 1000 MG capsule Once daily      . hydrochlorothiazide (MICROZIDE) 12.5 MG capsule Take 12.5 mg by mouth daily.      . isosorbide mononitrate (IMDUR) 60 MG 24 hr tablet Take 1/2 tab po daily       . losartan (COZAAR) 100 MG tablet Take 100 mg by mouth daily.        . Multiple Vitamins-Minerals (MULTIVITAMIN WITH MINERALS) tablet Take 1 tablet by mouth daily.        . nebivolol (BYSTOLIC) 10 MG tablet Take 10 mg by mouth daily. Take two tablets daily.      . pantoprazole (PROTONIX) 40 MG tablet Take 40 mg by mouth daily.        Marland Kitchen tiotropium (SPIRIVA) 18 MCG inhalation capsule Place 18 mcg into inhaler and inhale daily.         No current facility-administered medications for this visit.    He is married he has 2 children 5 grandchildren 3 great-grandchildren. He still smokes one pack of cigarettes per day. He does drink alcohol. He does not exercise  ROS is notable or mild shortness of breath. He denies fever chills night sweats. He does wear glasses. He denies bleeding. He is unaware of his irregular heart rhythm. He denies anginal-type symptoms. He denies abdominal pain. He denies melena or hematochezia. He denies myalgias. At times he does wheeze he does have COPD. He also has obstructive sleep apnea and uses CPAP 100% of the time with supplemental 2 L oxygen. He is unaware of breakthrough snoring. He denies residual daytime sleepiness. He denies bruxism. Other system  review is negative.  PE BP 120/74  Pulse 76  Ht 6' (1.829 m)  Wt 199 lb 3.2 oz (90.357 kg)  BMI 27.01 kg/m2  General: Alert, oriented, no distress.  HEENT: Normocephalic, atraumatic. Pupils round and reactive; sclera anicteric;  Nose without nasal septal hypertrophy Mouth/Parynx benign; Mallinpatti scale 3 Neck: No JVD, no carotid briuts Lungs: Decreased breath sounds; no wheezing or rales Heart: Irregularly irregular with a ventricular rate in the 70s., s1 s2 normal 1/6 systolic murmur. Abdomen: soft, nontender; no hepatosplenomehaly, BS+; abdominal aorta nontender and not dilated by palpation. Pulses  2+ Extremities: no clubbinbg cyanosis or edema, Homan's sign negative  Neurologic: grossly nonfocal  ECG: Atrial fibrillation at 76 beats per minute. He has poor R wave progression. Nonspecific ST-T change which have been noted previously.  LABS:  BMET    Component Value Date/Time   NA 144 09/30/2010 0514   K 4.6 09/30/2010 0514   CL 106 09/30/2010 0514   CO2 30 09/30/2010 0514   GLUCOSE 161* 09/30/2010 0514   BUN 22 09/30/2010 0514   CREATININE 1.08 09/30/2010 0514   CALCIUM 9.7 09/30/2010 0514   GFRNONAA >60 09/30/2010 0514   GFRAA >60 09/30/2010 0514     Hepatic Function Panel     Component Value Date/Time   PROT 7.1 09/30/2010 0514   ALBUMIN 3.5 09/30/2010 0514   AST 14 09/30/2010 0514   ALT 13 09/30/2010 0514   ALKPHOS 82 09/30/2010 0514   BILITOT 0.2* 09/30/2010 0514     CBC    Component Value Date/Time   WBC 10.0 09/30/2010 0514   RBC 4.73 09/30/2010 0514   HGB 13.9 09/30/2010 0514   HCT 43.3 09/30/2010 0514   PLT 183 09/30/2010 0514   MCV 91.5 09/30/2010 0514   MCH 29.4 09/30/2010 0514   MCHC 32.1 09/30/2010 0514   RDW 14.0 09/30/2010 0514   LYMPHSABS 1.0 09/30/2010 0514   MONOABS 0.8 09/30/2010 0514   EOSABS 0.0 09/30/2010 0514   BASOSABS 0.0 09/30/2010 0514     BNP    Component Value Date/Time   PROBNP 1412.0* 09/30/2010 0026    Lipid Panel  No results found  for this basename: chol, trig, hdl, cholhdl, vldl, ldlcalc     RADIOLOGY: Dg Chest 2 View  06/01/2012   *RADIOLOGY REPORT*  Clinical Data: Pre cardiac catheterization evaluation. Hypertension.  Shortness of breath.  COPD.  CHEST - 2 VIEW  Comparison: 10/21/2011  Findings: Underlying changes of COPD with hyperinflation, flattening of hemidiaphragms and some increased interstitial markings at the lung bases are stable.  Taking this into consideration heart size is upper normal and a normal mediastinal contour is seen.  The lung fields are clear with no signs of focal infiltrate or congestive failure.  No pleural fluid is seen.  Bony structures demonstrate mild degenerative change of the mid and lower thoracic spine. Old healed fracture of the right ninth rib is seen.  Bony structures are otherwise intact  IMPRESSION: Stable COPD and basilar scarring changes.  No new worrisome focal or acute abnormality seen   Original Report Authenticated By: Rhodia Albright, M.D.      ASSESSMENT AND PLAN: Mr. Goshert is now 3 weeks status post cardioversion for atrial fibrillation of questionable duration. A recent echo Doppler study had shown an ejection fraction of 55-60%. His left atrial volume was 26 mL's per meter squared, not significantly dilated. Mr. Fread unfortunately has converted back into atrial fibrillation. He was unaware of this the he does have a history of COPD and significant wheezing. For this reason he may not be a candidate for amiodarone. Electing to initiate multaq 400 mg twice a day currently again discussed the importance of continued CPAP use to increase frequency of recurrent AF with suboptimal treatment. I again had a long discussion with him concerning the importance of complete smoking cessation and provided counseling for this. I will see him back in the office in approximately 3 weeks for followup evaluation. He is still in atrial fibrillation at that time we'll then make plans for another  cardioversion attempt on  multaq.     Lennette Bihari, MD, [facility]  06/29/2012 2:11 PM

## 2012-07-04 ENCOUNTER — Telehealth: Payer: Self-pay | Admitting: Cardiovascular Disease

## 2012-07-04 NOTE — Telephone Encounter (Signed)
Please call-pt is already taking Digoxin-Dr Tresa Endo wrote a new prescription  For [facility] he still want him to take his Digoxin?

## 2012-07-04 NOTE — Telephone Encounter (Signed)
Informed Emily @ [facility] per Dr. Tresa Endo that he does  want patient to start the Multaq.

## 2012-07-04 NOTE — Telephone Encounter (Signed)
Message forwarded to Dr. Kelly/Wanda, CMA.  

## 2012-07-10 ENCOUNTER — Ambulatory Visit (INDEPENDENT_AMBULATORY_CARE_PROVIDER_SITE_OTHER): Payer: Medicare Other | Admitting: Pulmonary Disease

## 2012-07-10 ENCOUNTER — Encounter: Payer: Self-pay | Admitting: Pulmonary Disease

## 2012-07-10 VITALS — BP 122/62 | HR 87 | Temp 97.3°F | Ht 70.25 in | Wt 201.6 lb

## 2012-07-10 DIAGNOSIS — J449 Chronic obstructive pulmonary disease, unspecified: Secondary | ICD-10-CM

## 2012-07-10 NOTE — Patient Instructions (Addendum)
Stay on current inhalers. Wear your oxygen anytime you leave the house.  Do not take off unless you are going to be sitting down for a prolonged period of time Stop smoking.  This is the key to you staying well. Work on some type of exercise program. followup with me in 4 mos.

## 2012-07-10 NOTE — Assessment & Plan Note (Signed)
The patient appears to be stable from a pulmonary standpoint, although he continues to smoke and is noncompliant with his exertional oxygen.  He tells me that he has not had an acute exacerbation or pulmonary infection since the last visit.  I have asked him to continue on his current bronchodilator regimen, and stressed to him the importance of staying on his exertional oxygen consistently.  I have also reviewed various smoking cessation techniques, and stressed to him the importance of total smoking cessation.

## 2012-07-10 NOTE — Progress Notes (Signed)
  Subjective:    Patient ID: [name], male    DOB: 14-Oct-1940, 72 y.o.   MRN: [id number]  HPI Patient comes in today for followup of his known severe COPD with chronic respiratory failure.  Unfortunately, he continues to smoke, and remains noncompliant with his exertional oxygen.  He feels that his breathing has worsened with the higher temperatures and humidity.  He denies any significant congestion purulent mucus.  Otherwise, he feels that he is at his baseline.   Review of Systems  Constitutional: Negative for fever and unexpected weight change.  HENT: Negative for ear pain, nosebleeds, congestion, sore throat, rhinorrhea, sneezing, trouble swallowing, dental problem, postnasal drip and sinus pressure.   Eyes: Negative for redness and itching.  Respiratory: Positive for shortness of breath. Negative for cough, chest tightness and wheezing.   Cardiovascular: Negative for palpitations and leg swelling.  Gastrointestinal: Negative for nausea and vomiting.  Genitourinary: Negative for dysuria.  Musculoskeletal: Negative for joint swelling.  Skin: Negative for rash.  Neurological: Negative for headaches.  Hematological: Does not bruise/bleed easily.  Psychiatric/Behavioral: Negative for dysphoric mood. The patient is not nervous/anxious.        Objective:   Physical Exam Overweight male in no acute distress Nose without purulence or discharge noted Neck without lymphadenopathy or thyromegaly Chest with decreased breath sounds, no wheezes or rhonchi Cardiac exam with regular rate and rhythm Lower extremities with mild ankle edema, no cyanosis Alert and oriented, moves all 4 extremities.       Assessment & Plan:

## 2012-07-24 ENCOUNTER — Encounter: Payer: Self-pay | Admitting: Cardiovascular Disease

## 2012-07-24 ENCOUNTER — Encounter (HOSPITAL_COMMUNITY): Payer: Self-pay | Admitting: Cardiovascular Disease

## 2012-07-24 ENCOUNTER — Ambulatory Visit (INDEPENDENT_AMBULATORY_CARE_PROVIDER_SITE_OTHER): Payer: Medicare Other | Admitting: Cardiovascular Disease

## 2012-07-24 VITALS — BP 130/70 | HR 87 | Ht 71.0 in | Wt 200.0 lb

## 2012-07-24 DIAGNOSIS — Z01818 Encounter for other preprocedural examination: Secondary | ICD-10-CM

## 2012-07-24 DIAGNOSIS — I4891 Unspecified atrial fibrillation: Secondary | ICD-10-CM

## 2012-07-24 DIAGNOSIS — G4733 Obstructive sleep apnea (adult) (pediatric): Secondary | ICD-10-CM

## 2012-07-24 DIAGNOSIS — I2510 Atherosclerotic heart disease of native coronary artery without angina pectoris: Secondary | ICD-10-CM

## 2012-07-24 MED ORDER — DILTIAZEM HCL ER COATED BEADS 180 MG PO CP24: 180.0000 mg | ORAL_CAPSULE | Freq: Every day | ORAL | Status: DC

## 2012-07-24 NOTE — Patient Instructions (Addendum)
Your physician has recommended that you have a Cardioversion (DCCV). Electrical Cardioversion uses a jolt of electricity to your heart either through paddles or wired patches attached to your chest. This is a controlled, usually prescheduled, procedure. Defibrillation is done under light anesthesia in the hospital, and you usually go home the day of the procedure. This is done to get your heart back into a normal rhythm. You are not awake for the procedure. Please see the instruction sheet given to you today.  Your physician has recommended you make the following change in your medication: diltiazem from 120 mg to 180mg . This RX has already been sent to your pharmacy.  Your physician recommends that you return for lab work within 7 days of your procedure.  A chest x-ray takes a picture of the organs and structures inside the chest, including the heart, lungs, and blood vessels. This test can show several things, including, whether the heart is enlarges; whether fluid is building up in the lungs; and whether pacemaker / defibrillator leads are still in place.

## 2012-07-24 NOTE — Progress Notes (Signed)
Patient ID: [name], male   DOB: 1940/10/24, 72 y.o.   MRN: [id number]  HPI: [name], is a 72 y.o. male presents to the office today for cardiologic followup of his AF.  I last saw him in the office on 06/29/2012 at which time he was back in atrial fibrillation after having undergone DC cardioversion on 06/07/2012.   The patient has known CAD and underwent initial stenting of his right coronary artery in 2003 by Dr. Aleen Campi. He established care with me in 2011. At that time, because of increased symptoms, cardiac catheterization revealed total occlusion of the RCA within a perviously stented segment and I was able to successfully reopen his RCA and also perform PTCA distal to his previous stents. He also had concomitant CAD of his LAD and circumflex vessel and also has documented infrarenal abdominal aortic aneurysm with last Doppler measurement at 3.2 x 3.05 cm in greatest diameter. He also has a history of continued tobacco smoking, hypertension, hyperlipidemia, as well as sleep apnea for which he sees Dr. Shelle Iron. Apparently, he also uses 3 liters of supplemental oxygen at nighttime. When I saw him on April 27, 2012, EKG suggested atrial fibrillation with rates in the 100-110 range. At that time, I further increased his Bystolic for improved rate control from 10 to 15 mg and due to his peripheral edema, reduced the diltiazem down to 120 mg. I started him on Eliquis 5 mg b.i.d. I also gave him a prescription for HCTZ to help with his peripheral edema. He underwent a 2D Echo-Doppler study which showed an ejection fraction of 55% to 60%. Left atrium was upper normal with LA volume of 26 mL/meter squared. Right atrium was mildly dilated. Estimated RV systolic pressure was 21 mm. I had increased his Bystolic to 20 at that office visit. On 06/07/2012 he underwent cardioversion and required countershock x2 with restoration to sinus rhythm. He did have frequent PACs initially following  cardioversion and this stabilized with IV Lopressor 2.5 mg.  When I saw him on 06/29/2012, and he apparently was unaware he had reverted back into atrial fibrillation. Due to his COPD I was concerned about initiating amiodarone and ultimately started him on multaq 400 mg twice a day. I further titrated his Bystolic to 15 mg and he tells me he inadvertently had increased this to 20 mg daily which he now takes at 10 twice a day regimen. Also I had reduced his Cardizem from 180-120 mg. He presents to the office today for followup Cardiologic evaluation.   [name] is not noticed any change in his cardiac rhythm. However, he was not aware that he was in an irregular rhythm and atrial fibrillation previously.  Unfortunately, [name] continues to smoke cigarettes at least one pack per day. From a cardiac standpoint, he denies recent chest pain. He does note some mild shortness of breath. He is unaware of tachy-palpitations.      Past Medical History  Diagnosis Date  . COPD (chronic obstructive pulmonary disease)   . Active smoker   . Sleep apnea     managed by Dr. Renne Crigler  . Obesity   . Coronary artery disease   . Benign neoplasm of colon   . Atrial fibrillation   . Pyelonephritis, unspecified   . Other and unspecified hyperlipidemia   . Personal history of peptic ulcer disease   . Elevated prostate specific antigen (PSA)   . Diaphragmatic hernia without mention of obstruction or gangrene   .  Esophageal reflux   . Tobacco use disorder   . Unspecified essential hypertension   . Peripheral vascular disease     Past Surgical History  Procedure Laterality Date  . Coronary artery stent  2003  . Cardioversion N/A 06/07/2012    Procedure: CARDIOVERSION;  Surgeon: Lennette Bihari, MD;  Location: [facility] ENDOSCOPY;  Service: Cardiovascular;  Laterality: N/A;    Allergies  Allergen Reactions  . Ranitidine Hcl     REACTION: throat swelling, difficulty breathing  . Beta Adrenergic Blockers      REACTION: sexual side effects  . Ramipril     REACTION: throat swelling  . Sulfonamide Derivatives     REACTION: childhood reation of hematuria  . Ticlopidine Hcl     REACTION: swelling    Current Outpatient Prescriptions  Medication Sig Dispense Refill  . apixaban (ELIQUIS) 5 MG TABS tablet Take 5 mg by mouth 2 (two) times daily.      Marland Kitchen aspirin 81 MG tablet Take 81 mg by mouth daily.        Marland Kitchen atorvastatin (LIPITOR) 40 MG tablet 1 by mouth at bedtime      . Cholecalciferol (VITAMIN D) 2000 UNITS CAPS Take 1 capsule by mouth daily.        Marland Kitchen DIGOX 125 MCG tablet       . diltiazem (CARDIZEM) 120 MG tablet Take 120 mg by mouth daily.        Marland Kitchen dronedarone (MULTAQ) 400 MG tablet Take 1 tablet (400 mg total) by mouth 2 (two) times daily with a meal.  60 tablet  6  . fish oil-omega-3 fatty acids 1000 MG capsule Once daily      . hydrochlorothiazide (MICROZIDE) 12.5 MG capsule Take 12.5 mg by mouth daily.      . isosorbide mononitrate (IMDUR) 60 MG 24 hr tablet       . losartan (COZAAR) 100 MG tablet Take 100 mg by mouth daily.        . Multiple Vitamins-Minerals (MULTIVITAMIN WITH MINERALS) tablet Take 1 tablet by mouth daily.        . nebivolol (BYSTOLIC) 10 MG tablet Take two tablets daily.      . pantoprazole (PROTONIX) 40 MG tablet Take 40 mg by mouth daily.        Marland Kitchen tiotropium (SPIRIVA) 18 MCG inhalation capsule Place 18 mcg into inhaler and inhale daily.        Marland Kitchen albuterol (PROVENTIL HFA;VENTOLIN HFA) 108 (90 BASE) MCG/ACT inhaler Inhale 2 puffs into the lungs every 6 (six) hours as needed.      . budesonide-formoterol (SYMBICORT) 160-4.5 MCG/ACT inhaler Inhale 2 puffs into the lungs 2 (two) times daily.      Marland Kitchen diltiazem (CARDIZEM CD) 180 MG 24 hr capsule Take 1 capsule (180 mg total) by mouth daily.  30 capsule  11   No current facility-administered medications for this visit.    He is married he has 2 children 5 grandchildren 3 great-grandchildren. He still smokes one pack of  cigarettes per day. He does drink alcohol. He does not exercise  ROS is notable or mild shortness of breath. He denies fever chills night sweats. He does wear glasses. He denies bleeding. He is unaware of his irregular heart rhythm. He denies anginal-type symptoms. He denies abdominal pain. He denies melena or hematochezia. He denies myalgias. At times he does wheeze he does have COPD. He also has obstructive sleep apnea and uses CPAP 100% of the time with supplemental 2  L oxygen. He is unaware of breakthrough snoring. He denies residual daytime sleepiness. He denies bruxism. Other system review is negative.  PE BP 130/70  Pulse 87  Ht 5\' 11"  (1.803 m)  Wt 200 lb (90.719 kg)  BMI 27.91 kg/m2  General: Alert, oriented, no distress.  HEENT: Normocephalic, atraumatic. Pupils round and reactive; sclera anicteric;  Nose without nasal septal hypertrophy Mouth/Parynx benign; Mallinpatti scale 3 Neck: No JVD, no carotid briuts Lungs: Decreased breath sounds; no wheezing or rales Heart: Irregularly irregular with a ventricular rate in the 70s., s1 s2 normal 1/6 systolic murmur. Abdomen: soft, nontender; no hepatosplenomehaly, BS+; abdominal aorta nontender and not dilated by palpation. Pulses 2+ Extremities: no clubbinbg cyanosis or edema, Homan's sign negative  Neurologic: grossly nonfocal  ECG: Atrial fibrillation at 87 beats per minute. He has poor R wave progression. Nonspecific ST-T change which have been noted previously.  LABS:  BMET    Component Value Date/Time   NA 144 09/30/2010 0514   K 4.6 09/30/2010 0514   CL 106 09/30/2010 0514   CO2 30 09/30/2010 0514   GLUCOSE 161* 09/30/2010 0514   BUN 22 09/30/2010 0514   CREATININE 1.08 09/30/2010 0514   CALCIUM 9.7 09/30/2010 0514   GFRNONAA >60 09/30/2010 0514   GFRAA >60 09/30/2010 0514     Hepatic Function Panel     Component Value Date/Time   PROT 7.1 09/30/2010 0514   ALBUMIN 3.5 09/30/2010 0514   AST 14 09/30/2010 0514   ALT 13  09/30/2010 0514   ALKPHOS 82 09/30/2010 0514   BILITOT 0.2* 09/30/2010 0514     CBC    Component Value Date/Time   WBC 10.0 09/30/2010 0514   RBC 4.73 09/30/2010 0514   HGB 13.9 09/30/2010 0514   HCT 43.3 09/30/2010 0514   PLT 183 09/30/2010 0514   MCV 91.5 09/30/2010 0514   MCH 29.4 09/30/2010 0514   MCHC 32.1 09/30/2010 0514   RDW 14.0 09/30/2010 0514   LYMPHSABS 1.0 09/30/2010 0514   MONOABS 0.8 09/30/2010 0514   EOSABS 0.0 09/30/2010 0514   BASOSABS 0.0 09/30/2010 0514     BNP    Component Value Date/Time   PROBNP 1412.0* 09/30/2010 0026    Lipid Panel  No results found for this basename: chol,  trig,  hdl,  cholhdl,  vldl,  ldlcalc     RADIOLOGY: Dg Chest 2 View  06/01/2012   *RADIOLOGY REPORT*  Clinical Data: Pre cardiac catheterization evaluation. Hypertension.  Shortness of breath.  COPD.  CHEST - 2 VIEW  Comparison: 10/21/2011  Findings: Underlying changes of COPD with hyperinflation, flattening of hemidiaphragms and some increased interstitial markings at the lung bases are stable.  Taking this into consideration heart size is upper normal and a normal mediastinal contour is seen.  The lung fields are clear with no signs of focal infiltrate or congestive failure.  No pleural fluid is seen.  Bony structures demonstrate mild degenerative change of the mid and lower thoracic spine. Old healed fracture of the right ninth rib is seen.  Bony structures are otherwise intact  IMPRESSION: Stable COPD and basilar scarring changes.  No new worrisome focal or acute abnormality seen   Original Report Authenticated By: Rhodia Albright, M.D.      ASSESSMENT AND PLAN: [name] is now 6 weeks status post cardioversion for atrial fibrillation of questionable duration with documented recurrent atrial fibrillation as his last office visit of 06/29/2012. A recent echo Doppler study had shown an ejection  fraction of 55-60%. His left atrial volume was 26 mL's per meter squared, not significantly  dilated. [name] continues to be relatively symptom-free despite recurrent atrial fibrillation. He is now on Bystolic 20 mg daily in addition to multiecho 400 mg twice a day and diltiazem 120 mg. I will further increase his diltiazem back to 180 mg. We discussed another attempt at outpatient cardioversion on antiarrhythmic therapy. We will plan to do this at [facility] sometime after July 8.  Hopefully he will  maintain sinus rhythm with Multaq. I he again develops recurrent atrial fibrillation, we will need to consider amiodarone versus electrophysiologic assessment for candidacy of atrial fibrillation ablation. He is using his CPAP 100% of the time with reference to his obstructive sleep apnea. We again discussed the importance of smoking cessation.   Lennette Bihari, MD, [facility]  07/24/2012 8:53 AM

## 2012-07-26 ENCOUNTER — Other Ambulatory Visit: Payer: Self-pay | Admitting: *Deleted

## 2012-07-26 DIAGNOSIS — Z01818 Encounter for other preprocedural examination: Secondary | ICD-10-CM

## 2012-07-27 ENCOUNTER — Telehealth: Payer: Self-pay | Admitting: Pulmonary Disease

## 2012-07-27 NOTE — Telephone Encounter (Signed)
Spoke with pt He reports had fever of 101.5 last night- resolved with ASA  He also states having slight increase in dyspnea for the past 2 days Denies any increased cough or mucus production, CP, chest tightness  Please advise thanks! Allergies  Allergen Reactions  . Ranitidine Hcl     REACTION: throat swelling, difficulty breathing  . Beta Adrenergic Blockers     REACTION: sexual side effects  . Ramipril     REACTION: throat swelling  . Sulfonamide Derivatives     REACTION: childhood reation of hematuria  . Ticlopidine Hcl     REACTION: swelling

## 2012-07-27 NOTE — Telephone Encounter (Signed)
Spoke to pt. He states that he is not coughing up much mucus. Slightly congested but not much.

## 2012-07-27 NOTE — Telephone Encounter (Signed)
Fever can can from many different things. Is he congested, is he coughing up nasty mucus? If not, then it may not be coming from his lungs.  Please find out more info.  Thanks.

## 2012-07-27 NOTE — Telephone Encounter (Signed)
Ok to call in levaquin 750mg  one each day for 5 days.  If is getting worse over weekend, needs to go to ER Also, do not take asa for fever.  Take tylenol instead, unless he has known liver disease.

## 2012-07-28 ENCOUNTER — Encounter (HOSPITAL_COMMUNITY): Payer: Self-pay | Admitting: Emergency Medicine

## 2012-07-28 ENCOUNTER — Inpatient Hospital Stay (HOSPITAL_COMMUNITY)
Admission: EM | Admit: 2012-07-28 | Discharge: 2012-08-08 | DRG: 189 | Disposition: A | Discharge status: Home Health | Payer: Medicare Other | Attending: Internal Medicine | Admitting: Internal Medicine

## 2012-07-28 ENCOUNTER — Emergency Department (HOSPITAL_COMMUNITY): Payer: Medicare Other

## 2012-07-28 DIAGNOSIS — E785 Hyperlipidemia, unspecified: Secondary | ICD-10-CM

## 2012-07-28 DIAGNOSIS — R339 Retention of urine, unspecified: Secondary | ICD-10-CM | POA: Diagnosis not present

## 2012-07-28 DIAGNOSIS — Z6828 Body mass index (BMI) 28.0-28.9, adult: Secondary | ICD-10-CM

## 2012-07-28 DIAGNOSIS — Z79899 Other long term (current) drug therapy: Secondary | ICD-10-CM

## 2012-07-28 DIAGNOSIS — N12 Tubulo-interstitial nephritis, not specified as acute or chronic: Secondary | ICD-10-CM

## 2012-07-28 DIAGNOSIS — I5033 Acute on chronic diastolic (congestive) heart failure: Secondary | ICD-10-CM | POA: Diagnosis present

## 2012-07-28 DIAGNOSIS — I4891 Unspecified atrial fibrillation: Secondary | ICD-10-CM | POA: Diagnosis present

## 2012-07-28 DIAGNOSIS — I4819 Other persistent atrial fibrillation: Secondary | ICD-10-CM

## 2012-07-28 DIAGNOSIS — IMO0002 Reserved for concepts with insufficient information to code with codable children: Secondary | ICD-10-CM

## 2012-07-28 DIAGNOSIS — N320 Bladder-neck obstruction: Secondary | ICD-10-CM | POA: Diagnosis present

## 2012-07-28 DIAGNOSIS — J9620 Acute and chronic respiratory failure, unspecified whether with hypoxia or hypercapnia: Principal | ICD-10-CM | POA: Diagnosis present

## 2012-07-28 DIAGNOSIS — Z8249 Family history of ischemic heart disease and other diseases of the circulatory system: Secondary | ICD-10-CM

## 2012-07-28 DIAGNOSIS — J9690 Respiratory failure, unspecified, unspecified whether with hypoxia or hypercapnia: Secondary | ICD-10-CM

## 2012-07-28 DIAGNOSIS — I10 Essential (primary) hypertension: Secondary | ICD-10-CM

## 2012-07-28 DIAGNOSIS — N289 Disorder of kidney and ureter, unspecified: Secondary | ICD-10-CM | POA: Diagnosis not present

## 2012-07-28 DIAGNOSIS — Z836 Family history of other diseases of the respiratory system: Secondary | ICD-10-CM

## 2012-07-28 DIAGNOSIS — R5381 Other malaise: Secondary | ICD-10-CM | POA: Diagnosis not present

## 2012-07-28 DIAGNOSIS — J449 Chronic obstructive pulmonary disease, unspecified: Secondary | ICD-10-CM

## 2012-07-28 DIAGNOSIS — Z7901 Long term (current) use of anticoagulants: Secondary | ICD-10-CM

## 2012-07-28 DIAGNOSIS — J189 Pneumonia, unspecified organism: Secondary | ICD-10-CM

## 2012-07-28 DIAGNOSIS — J441 Chronic obstructive pulmonary disease with (acute) exacerbation: Secondary | ICD-10-CM | POA: Diagnosis present

## 2012-07-28 DIAGNOSIS — R972 Elevated prostate specific antigen [PSA]: Secondary | ICD-10-CM

## 2012-07-28 DIAGNOSIS — F17200 Nicotine dependence, unspecified, uncomplicated: Secondary | ICD-10-CM | POA: Diagnosis present

## 2012-07-28 DIAGNOSIS — N529 Male erectile dysfunction, unspecified: Secondary | ICD-10-CM

## 2012-07-28 DIAGNOSIS — Z882 Allergy status to sulfonamides status: Secondary | ICD-10-CM

## 2012-07-28 DIAGNOSIS — I509 Heart failure, unspecified: Secondary | ICD-10-CM | POA: Diagnosis present

## 2012-07-28 DIAGNOSIS — N401 Enlarged prostate with lower urinary tract symptoms: Secondary | ICD-10-CM | POA: Diagnosis present

## 2012-07-28 DIAGNOSIS — N189 Chronic kidney disease, unspecified: Secondary | ICD-10-CM | POA: Diagnosis present

## 2012-07-28 DIAGNOSIS — R0902 Hypoxemia: Secondary | ICD-10-CM

## 2012-07-28 DIAGNOSIS — E872 Acidosis, unspecified: Secondary | ICD-10-CM | POA: Diagnosis not present

## 2012-07-28 DIAGNOSIS — I739 Peripheral vascular disease, unspecified: Secondary | ICD-10-CM | POA: Diagnosis present

## 2012-07-28 DIAGNOSIS — Z888 Allergy status to other drugs, medicaments and biological substances status: Secondary | ICD-10-CM

## 2012-07-28 DIAGNOSIS — Z7982 Long term (current) use of aspirin: Secondary | ICD-10-CM

## 2012-07-28 DIAGNOSIS — Z9981 Dependence on supplemental oxygen: Secondary | ICD-10-CM

## 2012-07-28 DIAGNOSIS — Z8711 Personal history of peptic ulcer disease: Secondary | ICD-10-CM

## 2012-07-28 DIAGNOSIS — I129 Hypertensive chronic kidney disease with stage 1 through stage 4 chronic kidney disease, or unspecified chronic kidney disease: Secondary | ICD-10-CM | POA: Diagnosis present

## 2012-07-28 DIAGNOSIS — R319 Hematuria, unspecified: Secondary | ICD-10-CM | POA: Diagnosis not present

## 2012-07-28 DIAGNOSIS — N138 Other obstructive and reflux uropathy: Secondary | ICD-10-CM | POA: Diagnosis present

## 2012-07-28 DIAGNOSIS — N179 Acute kidney failure, unspecified: Secondary | ICD-10-CM | POA: Diagnosis not present

## 2012-07-28 DIAGNOSIS — G4733 Obstructive sleep apnea (adult) (pediatric): Secondary | ICD-10-CM | POA: Diagnosis present

## 2012-07-28 DIAGNOSIS — D126 Benign neoplasm of colon, unspecified: Secondary | ICD-10-CM

## 2012-07-28 DIAGNOSIS — K449 Diaphragmatic hernia without obstruction or gangrene: Secondary | ICD-10-CM

## 2012-07-28 DIAGNOSIS — K219 Gastro-esophageal reflux disease without esophagitis: Secondary | ICD-10-CM | POA: Diagnosis present

## 2012-07-28 DIAGNOSIS — Z9861 Coronary angioplasty status: Secondary | ICD-10-CM

## 2012-07-28 DIAGNOSIS — I2510 Atherosclerotic heart disease of native coronary artery without angina pectoris: Secondary | ICD-10-CM | POA: Diagnosis present

## 2012-07-28 DIAGNOSIS — E669 Obesity, unspecified: Secondary | ICD-10-CM | POA: Diagnosis present

## 2012-07-28 LAB — APTT: aPTT: 35 seconds (ref 24–37)

## 2012-07-28 LAB — CBC WITH DIFFERENTIAL/PLATELET
Basophils Absolute: 0 10*3/uL (ref 0.0–0.1)
Eosinophils Absolute: 0 10*3/uL (ref 0.0–0.7)
Eosinophils Relative: 0 % (ref 0–5)
Eosinophils Relative: 0 % (ref 0–5)
HCT: 42.1 % (ref 39.0–52.0)
Hemoglobin: 13.6 g/dL (ref 13.0–17.0)
Lymphocytes Relative: 4 % — ABNORMAL LOW (ref 12–46)
Lymphs Abs: 0.5 10*3/uL — ABNORMAL LOW (ref 0.7–4.0)
MCH: 30.4 pg (ref 26.0–34.0)
MCH: 30.8 pg (ref 26.0–34.0)
MCHC: 32.7 g/dL (ref 30.0–36.0)
MCV: 94.2 fL (ref 78.0–100.0)
MCV: 93.9 fL (ref 78.0–100.0)
Monocytes Relative: 11 % (ref 3–12)
Platelets: 124 10*3/uL — ABNORMAL LOW (ref 150–400)
Platelets: 124 10*3/uL — ABNORMAL LOW (ref 150–400)
RBC: 4.47 MIL/uL (ref 4.22–5.81)
RDW: 14 % (ref 11.5–15.5)
WBC: 11.2 10*3/uL — ABNORMAL HIGH (ref 4.0–10.5)

## 2012-07-28 LAB — POCT I-STAT 3, ART BLOOD GAS (G3+)
Bicarbonate: 29.8 mEq/L — ABNORMAL HIGH (ref 20.0–24.0)
TCO2: 32 mmol/L (ref 0–100)
pCO2 arterial: 86.9 mmHg (ref 35.0–45.0)
pH, Arterial: 7.143 — CL (ref 7.350–7.450)
pO2, Arterial: 132 mmHg — ABNORMAL HIGH (ref 80.0–100.0)

## 2012-07-28 LAB — COMPREHENSIVE METABOLIC PANEL
ALT: 17 U/L (ref 0–53)
AST: 17 U/L (ref 0–37)
Albumin: 3.2 g/dL — ABNORMAL LOW (ref 3.5–5.2)
Alkaline Phosphatase: 74 U/L (ref 39–117)
Alkaline Phosphatase: 75 U/L (ref 39–117)
BUN: 29 mg/dL — ABNORMAL HIGH (ref 6–23)
BUN: 29 mg/dL — ABNORMAL HIGH (ref 6–23)
CO2: 30 mEq/L (ref 19–32)
Calcium: 8.9 mg/dL (ref 8.4–10.5)
Chloride: 102 mEq/L (ref 96–112)
GFR calc Af Amer: 43 mL/min — ABNORMAL LOW (ref >90)
GFR calc non Af Amer: 37 mL/min — ABNORMAL LOW (ref >90)
Glucose, Bld: 124 mg/dL — ABNORMAL HIGH (ref 70–99)
Potassium: 5.1 mEq/L (ref 3.5–5.1)
Sodium: 141 mEq/L (ref 135–145)
Total Bilirubin: 0.3 mg/dL (ref 0.3–1.2)

## 2012-07-28 LAB — URINALYSIS W MICROSCOPIC + REFLEX CULTURE
Nitrite: NEGATIVE
Specific Gravity, Urine: 1.02 (ref 1.005–1.030)
pH: 5 (ref 5.0–8.0)

## 2012-07-28 LAB — BLOOD GAS, ARTERIAL
Bicarbonate: 27.7 mEq/L — ABNORMAL HIGH (ref 20.0–24.0)
Delivery systems: POSITIVE
O2 Saturation: 95 %
Patient temperature: 98.6
TCO2: 30.2 mmol/L (ref 0–100)

## 2012-07-28 LAB — CG4 I-STAT (LACTIC ACID): Lactic Acid, Venous: 1.24 mmol/L (ref 0.5–2.2)

## 2012-07-28 MED ORDER — ACETAMINOPHEN 650 MG RE SUPP: 650.0000 mg | Freq: Four times a day (QID) | RECTAL | Status: DC | PRN

## 2012-07-28 MED ORDER — TIOTROPIUM BROMIDE MONOHYDRATE 18 MCG IN CAPS
18.0000 ug | ORAL_CAPSULE | Freq: Every day | RESPIRATORY_TRACT | Status: DC
  Filled 2012-07-28: qty 5

## 2012-07-28 MED ORDER — IPRATROPIUM BROMIDE 0.02 % IN SOLN
0.5000 mg | RESPIRATORY_TRACT | Status: DC
  Administered 2012-07-28 – 2012-07-29 (×3): 0.5 mg via RESPIRATORY_TRACT
  Filled 2012-07-28 (×3): qty 2.5

## 2012-07-28 MED ORDER — NEBIVOLOL HCL 10 MG PO TABS
10.0000 mg | ORAL_TABLET | Freq: Every day | ORAL | Status: DC
  Administered 2012-07-29 – 2012-08-08 (×11): 10 mg via ORAL
  Filled 2012-07-28 (×12): qty 1

## 2012-07-28 MED ORDER — DILTIAZEM HCL 60 MG PO TABS: 120.0000 mg | ORAL_TABLET | Freq: Every day | ORAL | Status: DC

## 2012-07-28 MED ORDER — SODIUM CHLORIDE 0.9 % IV SOLN
INTRAVENOUS | Status: DC
  Administered 2012-07-28 – 2012-07-29 (×2): via INTRAVENOUS

## 2012-07-28 MED ORDER — SODIUM CHLORIDE 0.9 % IJ SOLN
3.0000 mL | Freq: Two times a day (BID) | INTRAMUSCULAR | Status: DC
  Administered 2012-07-28 – 2012-08-08 (×21): 3 mL via INTRAVENOUS

## 2012-07-28 MED ORDER — ASPIRIN EC 81 MG PO TBEC
81.0000 mg | DELAYED_RELEASE_TABLET | Freq: Every day | ORAL | Status: DC
  Administered 2012-07-29 – 2012-08-08 (×11): 81 mg via ORAL
  Filled 2012-07-28 (×12): qty 1

## 2012-07-28 MED ORDER — ALBUTEROL SULFATE (5 MG/ML) 0.5% IN NEBU: 10.0000 mg | INHALATION_SOLUTION | Freq: Once | RESPIRATORY_TRACT | Status: AC

## 2012-07-28 MED ORDER — DEXTROSE 5 % IV SOLN
1.0000 g | INTRAVENOUS | Status: DC
  Administered 2012-07-29 – 2012-07-30 (×2): 1 g via INTRAVENOUS
  Filled 2012-07-28 (×3): qty 10

## 2012-07-28 MED ORDER — ALBUTEROL SULFATE HFA 108 (90 BASE) MCG/ACT IN AERS: 2.0000 | INHALATION_SPRAY | Freq: Four times a day (QID) | RESPIRATORY_TRACT | Status: DC | PRN

## 2012-07-28 MED ORDER — ATORVASTATIN CALCIUM 10 MG PO TABS
10.0000 mg | ORAL_TABLET | Freq: Every day | ORAL | Status: DC
  Administered 2012-07-29 – 2012-08-07 (×10): 10 mg via ORAL
  Filled 2012-07-28 (×12): qty 1

## 2012-07-28 MED ORDER — ADULT MULTIVITAMIN W/MINERALS CH
1.0000 | ORAL_TABLET | Freq: Every day | ORAL | Status: DC
  Administered 2012-07-29 – 2012-08-08 (×11): 1 via ORAL
  Filled 2012-07-28 (×11): qty 1

## 2012-07-28 MED ORDER — DILTIAZEM HCL ER COATED BEADS 120 MG PO CP24
120.0000 mg | ORAL_CAPSULE | Freq: Every day | ORAL | Status: DC
  Filled 2012-07-28 (×2): qty 1

## 2012-07-28 MED ORDER — ISOSORBIDE MONONITRATE 15 MG HALF TABLET
15.0000 mg | ORAL_TABLET | Freq: Every day | ORAL | Status: DC
  Administered 2012-07-29 – 2012-08-08 (×11): 15 mg via ORAL
  Filled 2012-07-28 (×13): qty 1

## 2012-07-28 MED ORDER — ACETAMINOPHEN 325 MG PO TABS
650.0000 mg | ORAL_TABLET | Freq: Four times a day (QID) | ORAL | Status: DC | PRN
  Administered 2012-08-05: 650 mg via ORAL
  Filled 2012-07-28: qty 2

## 2012-07-28 MED ORDER — ALBUTEROL SULFATE HFA 108 (90 BASE) MCG/ACT IN AERS
2.0000 | INHALATION_SPRAY | Freq: Four times a day (QID) | RESPIRATORY_TRACT | Status: DC | PRN
  Filled 2012-07-28: qty 6.7

## 2012-07-28 MED ORDER — ALBUTEROL SULFATE (5 MG/ML) 0.5% IN NEBU: 2.5000 mg | INHALATION_SOLUTION | RESPIRATORY_TRACT | Status: DC | PRN

## 2012-07-28 MED ORDER — DEXTROSE 5 % IV SOLN
250.0000 mg | INTRAVENOUS | Status: DC
  Administered 2012-07-29: 250 mg via INTRAVENOUS
  Filled 2012-07-28: qty 250

## 2012-07-28 MED ORDER — DRONEDARONE HCL 400 MG PO TABS
400.0000 mg | ORAL_TABLET | Freq: Two times a day (BID) | ORAL | Status: DC
  Administered 2012-07-29 – 2012-08-08 (×21): 400 mg via ORAL
  Filled 2012-07-28 (×27): qty 1

## 2012-07-28 MED ORDER — SODIUM CHLORIDE 0.9 % IV BOLUS (SEPSIS)
500.0000 mL | Freq: Once | INTRAVENOUS | Status: AC
  Administered 2012-07-28: 500 mL via INTRAVENOUS

## 2012-07-28 MED ORDER — IPRATROPIUM BROMIDE 0.02 % IN SOLN
0.5000 mg | Freq: Once | RESPIRATORY_TRACT | Status: AC
  Administered 2012-07-28: 0.5 mg via RESPIRATORY_TRACT
  Filled 2012-07-28: qty 2.5

## 2012-07-28 MED ORDER — HYDROCHLOROTHIAZIDE 12.5 MG PO CAPS
12.5000 mg | ORAL_CAPSULE | Freq: Every day | ORAL | Status: DC
  Administered 2012-07-29 – 2012-08-03 (×6): 12.5 mg via ORAL
  Filled 2012-07-28 (×8): qty 1

## 2012-07-28 MED ORDER — METHYLPREDNISOLONE SODIUM SUCC 125 MG IJ SOLR
80.0000 mg | Freq: Two times a day (BID) | INTRAMUSCULAR | Status: DC
  Administered 2012-07-28: 80 mg via INTRAVENOUS
  Administered 2012-07-29: 21:00:00 via INTRAVENOUS
  Administered 2012-07-29: 80 mg via INTRAVENOUS
  Filled 2012-07-28 (×6): qty 1.28

## 2012-07-28 MED ORDER — SODIUM CHLORIDE 0.9 % IV BOLUS (SEPSIS)
2000.0000 mL | Freq: Once | INTRAVENOUS | Status: AC
  Administered 2012-07-28: 2000 mL via INTRAVENOUS

## 2012-07-28 MED ORDER — METHYLPREDNISOLONE SODIUM SUCC 125 MG IJ SOLR
125.0000 mg | Freq: Once | INTRAMUSCULAR | Status: AC
  Administered 2012-07-28: 125 mg via INTRAVENOUS
  Filled 2012-07-28: qty 2

## 2012-07-28 MED ORDER — LOSARTAN POTASSIUM 50 MG PO TABS
100.0000 mg | ORAL_TABLET | Freq: Every day | ORAL | Status: DC
  Filled 2012-07-28 (×2): qty 2

## 2012-07-28 MED ORDER — ALBUTEROL SULFATE (5 MG/ML) 0.5% IN NEBU
2.5000 mg | INHALATION_SOLUTION | Freq: Four times a day (QID) | RESPIRATORY_TRACT | Status: DC
  Administered 2012-07-28: 2.5 mg via RESPIRATORY_TRACT
  Filled 2012-07-28: qty 0.5

## 2012-07-28 MED ORDER — SODIUM CHLORIDE 0.9 % IV SOLN: INTRAVENOUS | Status: DC

## 2012-07-28 MED ORDER — VITAMIN D3 25 MCG (1000 UNIT) PO TABS
2000.0000 [IU] | ORAL_TABLET | Freq: Every day | ORAL | Status: DC
  Administered 2012-07-29 – 2012-08-08 (×11): 2000 [IU] via ORAL
  Filled 2012-07-28 (×12): qty 2

## 2012-07-28 MED ORDER — ASPIRIN 81 MG PO TABS: 81.0000 mg | ORAL_TABLET | Freq: Every day | ORAL | Status: DC

## 2012-07-28 MED ORDER — PANTOPRAZOLE SODIUM 40 MG PO TBEC
40.0000 mg | DELAYED_RELEASE_TABLET | Freq: Every day | ORAL | Status: DC
  Administered 2012-07-28 – 2012-08-03 (×7): 40 mg via ORAL
  Filled 2012-07-28 (×7): qty 1

## 2012-07-28 MED ORDER — APIXABAN 5 MG PO TABS
5.0000 mg | ORAL_TABLET | Freq: Two times a day (BID) | ORAL | Status: DC
  Administered 2012-07-29 – 2012-08-08 (×21): 5 mg via ORAL
  Filled 2012-07-28 (×23): qty 1

## 2012-07-28 MED ORDER — VITAMIN D 50 MCG (2000 UT) PO CAPS: 1.0000 | ORAL_CAPSULE | Freq: Every day | ORAL | Status: DC

## 2012-07-28 MED ORDER — DEXTROSE 5 % IV SOLN
1.0000 g | Freq: Once | INTRAVENOUS | Status: AC
  Administered 2012-07-28: 1 g via INTRAVENOUS
  Filled 2012-07-28: qty 10

## 2012-07-28 MED ORDER — OMEGA-3-ACID ETHYL ESTERS 1 G PO CAPS
1.0000 g | ORAL_CAPSULE | Freq: Every day | ORAL | Status: DC
  Administered 2012-07-29 – 2012-08-03 (×6): 1 g via ORAL
  Filled 2012-07-28 (×8): qty 1

## 2012-07-28 MED ORDER — APIXABAN 5 MG PO TABS
5.0000 mg | ORAL_TABLET | ORAL | Status: AC
  Administered 2012-07-28: 5 mg via ORAL
  Filled 2012-07-28: qty 1

## 2012-07-28 MED ORDER — OMEGA-3 FATTY ACIDS 1000 MG PO CAPS: 1.0000 g | ORAL_CAPSULE | Freq: Every day | ORAL | Status: DC

## 2012-07-28 MED ORDER — DEXTROSE 5 % IV SOLN
500.0000 mg | Freq: Once | INTRAVENOUS | Status: AC
  Administered 2012-07-28: 500 mg via INTRAVENOUS
  Filled 2012-07-28: qty 500

## 2012-07-28 MED ORDER — ALBUTEROL SULFATE (5 MG/ML) 0.5% IN NEBU
2.5000 mg | INHALATION_SOLUTION | RESPIRATORY_TRACT | Status: DC
  Administered 2012-07-28 – 2012-07-29 (×3): 2.5 mg via RESPIRATORY_TRACT
  Filled 2012-07-28 (×3): qty 0.5

## 2012-07-28 MED ORDER — ALBUTEROL (5 MG/ML) CONTINUOUS INHALATION SOLN
INHALATION_SOLUTION | RESPIRATORY_TRACT | Status: AC
  Filled 2012-07-28: qty 20

## 2012-07-28 NOTE — Progress Notes (Signed)
RT called to room to assess patient after he arrived from ED. RN stated his SATs were 86% on 4 LNC when he arrived and transferred over to different bed. Placed on 50% VM. When I arrived, SAT 98%, RR 28, BBS slightly diminished. Patient stated he had been SOB all day, felt some better now. I offered to put him back on BiPAP ( has not been on since this AM) and he refused. RN notified. RT will continue to monitor.

## 2012-07-28 NOTE — ED Notes (Signed)
Pt states 3 days x SOB denies chest pain; pt has hx of COPD. Pt denies fevers. Pt wife states pt can't lay down flat in bed, pt has to remain sitting to breath. Pt cough and states sometimes productive very little. Pt has not eaten much for last 3 days per wife.

## 2012-07-28 NOTE — ED Notes (Signed)
Respiratory notified of pts and families request to have BiPap mask looked at

## 2012-07-28 NOTE — ED Provider Notes (Signed)
History    CSN: [id number] Arrival date & time 07/28/12  0808  First MD Initiated Contact with Patient 07/28/12 [phone]     Chief Complaint  Patient presents with  . Shortness of Breath  PCCM Clance  (Consider location/radiation/quality/duration/timing/severity/associated sxs/prior Treatment) HPI 72 year old male has history of COPD he is on home oxygen, his history of atrial fibrillation as well, he presents with 3 days of coughing and shortness of breath with feeling of tiring out, he does use CPAP for sleep apnea, he's had no fever and altered mental status no chest pain no abdominal pain no vomiting or diarrhea but has had some decreased oral intake the last few days. He is in moderate respiratory distress with pulse oximetry in the 70s with hypoxia despite his home oxygen at 2 L. Past Medical History  Diagnosis Date  . COPD (chronic obstructive pulmonary disease)   . Active smoker   . Sleep apnea     managed by Dr. Renne Crigler  . Obesity   . Coronary artery disease   . Benign neoplasm of colon   . Atrial fibrillation   . Pyelonephritis, unspecified   . Other and unspecified hyperlipidemia   . Personal history of peptic ulcer disease   . Elevated prostate specific antigen (PSA)   . Diaphragmatic hernia without mention of obstruction or gangrene   . Esophageal reflux   . Tobacco use disorder   . Unspecified essential hypertension   . Peripheral vascular disease    Past Surgical History  Procedure Laterality Date  . Coronary artery stent  2003  . Cardioversion N/A 06/07/2012    Procedure: CARDIOVERSION;  Surgeon: Lennette Bihari, MD;  Location: [facility] ENDOSCOPY;  Service: Cardiovascular;  Laterality: N/A;   Family History  Problem Relation Age of Onset  . Heart disease Mother   . Coronary artery disease Father   . Emphysema Father    History  Substance Use Topics  . Smoking status: Current Every Day Smoker -- 1.00 packs/day for 55 years    Types: Cigarettes  . Smokeless  tobacco: Never Used     Comment: started at age 43.  prev was 2 ppd.   . Alcohol Use: 1.8 oz/week    3 Shots of liquor per week    Review of Systems 10 Systems reviewed and are negative for acute change except as noted in the HPI.  Allergies  Ranitidine hcl; Beta adrenergic blockers; Ramipril; Sulfonamide derivatives; and Ticlopidine hcl  Home Medications   No current outpatient prescriptions on file. BP 104/59  Pulse 88  Temp(Src) 97.6 F (36.4 C) (Oral)  Resp 25  Wt 199 lb 15.3 oz (90.7 kg)  BMI 27.9 kg/m2  SpO2 95% Physical Exam  Nursing note and vitals reviewed. Constitutional:  Awake, alert, moderately severe respiratory distress  HENT:  Head: Atraumatic.  Eyes: Right eye exhibits no discharge. Left eye exhibits no discharge.  Neck: Neck supple.  Cardiovascular:  No murmur heard. Tachycardic irregularly irregular  Pulmonary/Chest: He is in respiratory distress. He has wheezes. He has rales. He exhibits no tenderness.  Moderately severe respiratory distress, hypoxia, pulse oximetry in the 70s on his baseline 2 L nasal cannula, pulse oximetry 95% on 6 L nasal cannula oxygen, diffuse decreased breath sounds with prolonged expiratory phase with expiratory wheezes and inspiratory crackles at the left base only he does have accessory muscle usage and retractions  Abdominal: Soft. There is no tenderness. There is no rebound.  Musculoskeletal: He exhibits no edema and no tenderness.  Baseline ROM, no obvious new focal weakness.  Neurological: He is alert.  Mental status and motor strength appears baseline for patient and situation.  Skin: No rash noted.  Psychiatric: He has a normal mood and affect.    ED Course  Procedures (including critical care time) ECG: Atrial fibrillation, ventricular rate 114, normal axis, septal Q waves, diffuse slight ST depression, compared with 24Jun14 ST changes slightly more pronounced today  Pt feels improved after observation and/or  treatment in ED.Patient / Family / Caregiver informed of clinical course, understand medical decision-making process, and agree with plan.d/w PCCM for admit.  CRITICAL CARE Performed by: Hurman Horn Total critical care time: including Bipap and continuous nebs. Critical care time was exclusive of separately billable procedures and treating other patients. Critical care was necessary to treat or prevent imminent or life-threatening deterioration. Critical care was time spent personally by me on the following activities: development of treatment plan with patient and/or surrogate as well as nursing, discussions with consultants, evaluation of patient's response to treatment, examination of patient, obtaining history from patient or surrogate, ordering and performing treatments and interventions, ordering and review of laboratory studies, ordering and review of radiographic studies, pulse oximetry and re-evaluation of patient's condition.  Labs Reviewed  CBC WITH DIFFERENTIAL - Abnormal; Notable for the following:    WBC 11.2 (*)    Platelets 124 (*)    Neutrophils Relative % 85 (*)    Neutro Abs 9.5 (*)    Lymphocytes Relative 4 (*)    Lymphs Abs 0.5 (*)    Monocytes Absolute 1.2 (*)    All other components within normal limits  COMPREHENSIVE METABOLIC PANEL - Abnormal; Notable for the following:    Glucose, Bld 124 (*)    BUN 29 (*)    Creatinine, Ser 1.77 (*)    Albumin 3.4 (*)    GFR calc non Af Amer 37 (*)    GFR calc Af Amer 43 (*)    All other components within normal limits  COMPREHENSIVE METABOLIC PANEL - Abnormal; Notable for the following:    Glucose, Bld 160 (*)    BUN 29 (*)    Creatinine, Ser 1.72 (*)    Albumin 3.2 (*)    GFR calc non Af Amer 38 (*)    GFR calc Af Amer 44 (*)    All other components within normal limits  CBC WITH DIFFERENTIAL - Abnormal; Notable for the following:    WBC 11.0 (*)    RBC 4.13 (*)    Hemoglobin 12.7 (*)    HCT 38.8 (*)     Platelets 124 (*)    Neutrophils Relative % 89 (*)    Neutro Abs 9.8 (*)    Lymphocytes Relative 4 (*)    Lymphs Abs 0.4 (*)    All other components within normal limits  PROTIME-INR - Abnormal; Notable for the following:    Prothrombin Time 17.4 (*)    All other components within normal limits  PRO B NATRIURETIC PEPTIDE - Abnormal; Notable for the following:    Pro B Natriuretic peptide (BNP) 5624.0 (*)    All other components within normal limits  BASIC METABOLIC PANEL - Abnormal; Notable for the following:    Glucose, Bld 148 (*)    BUN 42 (*)    Creatinine, Ser 1.89 (*)    GFR calc non Af Amer 34 (*)    GFR calc Af Amer 40 (*)    All other components within normal  limits  CBC - Abnormal; Notable for the following:    RBC 4.06 (*)    Hemoglobin 12.1 (*)    HCT 38.4 (*)    Platelets 121 (*)    All other components within normal limits  BLOOD GAS, ARTERIAL - Abnormal; Notable for the following:    pH, Arterial 7.149 (*)    pCO2 arterial 83.0 (*)    Bicarbonate 27.7 (*)    All other components within normal limits  GLUCOSE, CAPILLARY - Abnormal; Notable for the following:    Glucose-Capillary 232 (*)    All other components within normal limits  URINALYSIS W MICROSCOPIC + REFLEX CULTURE - Abnormal; Notable for the following:    Color, Urine AMBER (*)    APPearance CLOUDY (*)    Hgb urine dipstick LARGE (*)    Bilirubin Urine SMALL (*)    Protein, ur 100 (*)    Leukocytes, UA SMALL (*)    Bacteria, UA MANY (*)    Squamous Epithelial / LPF FEW (*)    Casts GRANULAR CAST (*)    All other components within normal limits  BLOOD GAS, ARTERIAL - Abnormal; Notable for the following:    pH, Arterial 7.199 (*)    pCO2 arterial 69.8 (*)    Bicarbonate 26.1 (*)    All other components within normal limits  GLUCOSE, CAPILLARY - Abnormal; Notable for the following:    Glucose-Capillary 128 (*)    All other components within normal limits  GLUCOSE, CAPILLARY - Abnormal; Notable  for the following:    Glucose-Capillary 185 (*)    All other components within normal limits  GLUCOSE, CAPILLARY - Abnormal; Notable for the following:    Glucose-Capillary 217 (*)    All other components within normal limits  POCT I-STAT 3, BLOOD GAS (G3+) - Abnormal; Notable for the following:    pH, Arterial 7.143 (*)    pCO2 arterial 86.9 (*)    pO2, Arterial 132.0 (*)    Bicarbonate 29.8 (*)    All other components within normal limits  CULTURE, BLOOD (ROUTINE X 2)  CULTURE, BLOOD (ROUTINE X 2)  MRSA PCR SCREENING  CULTURE, EXPECTORATED SPUTUM-ASSESSMENT  URINE CULTURE  APTT  TROPONIN I  TROPONIN I  TROPONIN I  OCCULT BLOOD X 1 CARD TO LAB, STOOL  CG4 I-STAT (LACTIC ACID)  POCT I-STAT TROPONIN I   Dg Chest Port 1 View  07/28/2012   *RADIOLOGY REPORT*  Clinical Data: Shortness of breath.  Tobacco use.  COPD.  PORTABLE CHEST - 1 VIEW  Comparison: 06/01/2012  Findings: Increased coarsening of the lung interstitium.  Heart size within normal limits.  Atherosclerotic aortic arch noted.  Scarring noted at the left lung base.  IMPRESSION:  1.  Increased interstitial accentuation in the lungs suggesting atypical pneumonia or noncardiogenic edema.  No cardiomegaly. 2.  Emphysema.   Original Report Authenticated By: Gaylyn Rong, M.D.   1. COPD exacerbation   2. Respiratory failure   3. CAP (community acquired pneumonia)   4. Hypoxia   5. Acute-on-chronic respiratory failure     MDM  The patient appears reasonably stabilized for admission considering the current resources, flow, and capabilities available in the ED at this time, and I doubt any other [facility] requiring further screening and/or treatment in the ED prior to admission.  Hurman Horn, MD 07/29/12 2126

## 2012-07-28 NOTE — ED Notes (Signed)
respiratory at bedside.

## 2012-07-28 NOTE — Progress Notes (Signed)
Taken off BIPAP per MD order and placed on [place]. RT will continue to monitor.

## 2012-07-28 NOTE — ED Notes (Signed)
Report called to floor, Mardene Celeste, Charity fundraiser. Nurse has no further questions upon report given. Preparing pt for transport to floor.

## 2012-07-28 NOTE — ED Notes (Signed)
Respiratory at bedside.

## 2012-07-28 NOTE — H&P (Signed)
PULMONARY  / CRITICAL CARE MEDICINE  Name: [name] MRN: [id number] DOB: 1940/02/25    ADMISSION DATE:  07/28/2012 CONSULTATION DATE:  07/28/12  REFERRING MD :  Fonnie Jarvis PRIMARY SERVICE:  PCCM  CHIEF COMPLAINT:  Short of Breath  BRIEF PATIENT DESCRIPTION: 51 yoM smoker with known COPD, CAD, AFib, OSA presenting through ED with 3 days of increasing dyspnea he blames on smoking too much. Placed on BIPAP in ED. PCCM called to admit. Wife here.  SIGNIFICANT EVENTS / STUDIES:    LINES / TUBES: PIV  CULTURES:   ANTIBIOTICS: Rocephin 6/28>> Zith 6/28>>  HISTORY OF PRESENT ILLNESS:  Recently seen by Dr Shelle Iron for known COPD with home O2 2L/[employer], CPAP/ [employer].. Repeated smoking cessation counseling, but still 1 PPD. CAD/ stent by Dr Tresa Endo. Afib on Eliquis, failed cardioversion. Now he and wife describe 3 days of worsening dyspnea with wheeze, cough scant yellow, no blood. Denies fever, chills, pain, swelling, GI upset or other change. He just says he was smoking more heavily. Wife thought he might have gotten chilled sitting out on porch. Known OSA, compliant with CPAP. Wears O2 at night and sometimes with exertion.  PAST MEDICAL HISTORY :  Past Medical History  Diagnosis Date  . COPD (chronic obstructive pulmonary disease)   . Active smoker   . Sleep apnea     managed by Dr. Renne Crigler  . Obesity   . Coronary artery disease   . Benign neoplasm of colon   . Atrial fibrillation   . Pyelonephritis, unspecified   . Other and unspecified hyperlipidemia   . Personal history of peptic ulcer disease   . Elevated prostate specific antigen (PSA)   . Diaphragmatic hernia without mention of obstruction or gangrene   . Esophageal reflux   . Tobacco use disorder   . Unspecified essential hypertension   . Peripheral vascular disease    Past Surgical History  Procedure Laterality Date  . Coronary artery stent  2003  . Cardioversion N/A 06/07/2012    Procedure: CARDIOVERSION;  Surgeon:  Lennette Bihari, MD;  Location: [facility] ENDOSCOPY;  Service: Cardiovascular;  Laterality: N/A;   Prior to Admission medications   Medication Sig Start Date End Date Taking? Authorizing Provider  albuterol (PROVENTIL HFA;VENTOLIN HFA) 108 (90 BASE) MCG/ACT inhaler Inhale 2 puffs into the lungs every 6 (six) hours as needed for wheezing.   Yes Historical Provider, MD  apixaban (ELIQUIS) 5 MG TABS tablet Take 5 mg by mouth 2 (two) times daily.   Yes Historical Provider, MD  aspirin 81 MG tablet Take 81 mg by mouth daily.     Yes Historical Provider, MD  atorvastatin (LIPITOR) 40 MG tablet 1 by mouth at bedtime 06/20/11  Yes Historical Provider, MD  Cholecalciferol (VITAMIN D) 2000 UNITS CAPS Take 1 capsule by mouth daily.     Yes Historical Provider, MD  diltiazem (CARDIZEM) 120 MG tablet Take 120 mg by mouth daily.     Yes Historical Provider, MD  dronedarone (MULTAQ) 400 MG tablet Take 1 tablet (400 mg total) by mouth 2 (two) times daily with a meal. 06/29/12  Yes Lennette Bihari, MD  fish oil-omega-3 fatty acids 1000 MG capsule Once daily   Yes Historical Provider, MD  hydrochlorothiazide (MICROZIDE) 12.5 MG capsule Take 12.5 mg by mouth daily.   Yes Historical Provider, MD  isosorbide mononitrate (IMDUR) 60 MG 24 hr tablet  05/03/12  Yes Historical Provider, MD  losartan (COZAAR) 100 MG tablet Take 100 mg by mouth  daily.     Yes Historical Provider, MD  Multiple Vitamins-Minerals (MULTIVITAMIN WITH MINERALS) tablet Take 1 tablet by mouth daily.     Yes Historical Provider, MD  nebivolol (BYSTOLIC) 10 MG tablet Take two tablets daily.   Yes Historical Provider, MD  pantoprazole (PROTONIX) 40 MG tablet Take 40 mg by mouth daily.     Yes Historical Provider, MD  tiotropium (SPIRIVA) 18 MCG inhalation capsule Place 18 mcg into inhaler and inhale daily.     Yes Historical Provider, MD  albuterol (PROVENTIL HFA;VENTOLIN HFA) 108 (90 BASE) MCG/ACT inhaler Inhale 2 puffs into the lungs every 6 (six) hours as  needed. 04/26/10 07/10/12  Barbaraann Share, MD  budesonide-formoterol (SYMBICORT) 160-4.5 MCG/ACT inhaler Inhale 2 puffs into the lungs 2 (two) times daily. 04/26/10 07/10/12  Barbaraann Share, MD   Allergies  Allergen Reactions  . Ranitidine Hcl     REACTION: throat swelling, difficulty breathing  . Beta Adrenergic Blockers     REACTION: sexual side effects  . Ramipril     REACTION: throat swelling  . Sulfonamide Derivatives     REACTION: childhood reation of hematuria  . Ticlopidine Hcl     REACTION: swelling    FAMILY HISTORY:  Family History  Problem Relation Age of Onset  . Heart disease Mother   . Coronary artery disease Father   . Emphysema Father    SOCIAL HISTORY:  reports that he has been smoking Cigarettes.  He has a 55 pack-year smoking history. He has never used smokeless tobacco. He reports that he drinks about 1.8 ounces of alcohol per week. He reports that he does not use illicit drugs.  REVIEW OF SYSTEMS:   Constitutional: Negative for fever, chills, weight loss, malaise/fatigue and diaphoresis.  HENT: Negative for hearing loss, ear pain, nosebleeds, congestion, sore throat, neck pain, tinnitus and ear discharge.   Eyes: Negative for blurred vision, double vision, photophobia, pain, discharge and redness.  Respiratory:  +cough,No- hemoptysis, +sputum production, +shortness of breath, +wheezing and stridor.   Cardiovascular: Negative for chest pain, palpitations, orthopnea, claudication, leg swelling and PND.  Gastrointestinal: Negative for heartburn, nausea, vomiting, abdominal pain, diarrhea, constipation, blood in stool and melena.  Genitourinary: Negative for dysuria, urgency, frequency, hematuria and flank pain.  Musculoskeletal: Negative for myalgias, back pain, joint pain and falls.  Skin: Negative for itching and rash.  Neurological: Negative for dizziness, tingling, tremors, sensory change, speech change, focal weakness, seizures, loss of consciousness, weakness  and headaches.  Endo/Heme/Allergies: Negative for environmental allergies and polydipsia. Does not bruise/bleed easily.  SUBJECTIVE:   VITAL SIGNS: Pulse Rate:  [82-122] 111 (06/28 1045) Resp:  [17-37] 26 (06/28 1045) BP: (98-135)/(52-88) 110/75 mmHg (06/28 1045) SpO2:  [93 %-100 %] 100 % (06/28 1045) FiO2 (%):  [50 %] 50 % (06/28 1037)  PHYSICAL EXAMINATION: General:  Mild over-weight, elderly male, sitting upright on gurney w/ BIPAP, conversational, NAD Neuro: Fully oriented, moving all ext, PERRLA HEENT:  Mucosa clear, gross vision and hearing intact Neck:  2 cm JVD w/ BIPAP Cardiovascular:  AFIB, no murmur or rub, no edema Lungs:  Crackles/ rales lower zones Abdomen:  Soft, nontender, BS+ Musculoskeletal: grossly normal,  Skin: no rash   Recent Labs Lab 07/28/12 0913  NA 141  K 5.0  CL 101  CO2 30  BUN 29*  CREATININE 1.77*  GLUCOSE 124*    Recent Labs Lab 07/28/12 0913  HGB 13.6  HCT 42.1  WBC 11.2*  PLT 124*   Dg  Chest Port 1 View  07/28/2012   *RADIOLOGY REPORT*  Clinical Data: Shortness of breath.  Tobacco use.  COPD.  PORTABLE CHEST - 1 VIEW  Comparison: 06/01/2012  Findings: Increased coarsening of the lung interstitium.  Heart size within normal limits.  Atherosclerotic aortic arch noted.  Scarring noted at the left lung base.  IMPRESSION:  1.  Increased interstitial accentuation in the lungs suggesting atypical pneumonia or noncardiogenic edema.  No cardiomegaly. 2.  Emphysema.   Original Report Authenticated By: Gaylyn Rong, M.D.    ASSESSMENT / PLAN: Acute on Chronic Resp Failure/ acute exacerbation of O2 dependent COPD with ongoing tobacco abuse despite counseling. CAD/ AFib without pain. CXR is indeterminate with more prominent interstitial markings- Bronchopneumonia and/ or interstitial edema. Now on BIPAP. Needs inpatient admission to stepdown unit, anticipating more than two midnights. Will check cardiac enzymes, but doubt acute cardiac  injury. Continue Eliquis. Cover pulmonary status for suspected CAP with Roc/ zith, BD, Steroids. Wean off BIPAP as able, but continue for sleep. Wife to bring his CPAP mask from home.  CD Maple Hudson, MD Pulmonary and Critical Care Medicine [facility] Pager: [phone]  07/28/2012, 11:43 AM

## 2012-07-28 NOTE — ED Notes (Signed)
Chest xray at bedside.

## 2012-07-28 NOTE — ED Notes (Signed)
Intensivist at bedside.

## 2012-07-28 NOTE — Progress Notes (Signed)
Pt admitted from ED, pt with labored breathing upon arrival clenching side rails, pt tx 2300, pt and family updated, report called, all questions answered

## 2012-07-29 DIAGNOSIS — J9620 Acute and chronic respiratory failure, unspecified whether with hypoxia or hypercapnia: Principal | ICD-10-CM

## 2012-07-29 DIAGNOSIS — F17200 Nicotine dependence, unspecified, uncomplicated: Secondary | ICD-10-CM | POA: Diagnosis present

## 2012-07-29 LAB — GLUCOSE, CAPILLARY
Glucose-Capillary: 128 mg/dL — ABNORMAL HIGH (ref 70–99)
Glucose-Capillary: 185 mg/dL — ABNORMAL HIGH (ref 70–99)
Glucose-Capillary: 187 mg/dL — ABNORMAL HIGH (ref 70–99)

## 2012-07-29 LAB — BLOOD GAS, ARTERIAL
Acid-base deficit: 1 mmol/L (ref 0.0–2.0)
Bicarbonate: 26.1 mEq/L — ABNORMAL HIGH (ref 20.0–24.0)
Delivery systems: POSITIVE
Inspiratory PAP: 16
TCO2: 28.3 mmol/L (ref 0–100)
pCO2 arterial: 69.8 mmHg (ref 35.0–45.0)

## 2012-07-29 LAB — BASIC METABOLIC PANEL
BUN: 42 mg/dL — ABNORMAL HIGH (ref 6–23)
Chloride: 105 mEq/L (ref 96–112)
Creatinine, Ser: 1.89 mg/dL — ABNORMAL HIGH (ref 0.50–1.35)
GFR calc Af Amer: 40 mL/min — ABNORMAL LOW (ref >90)

## 2012-07-29 LAB — CBC
HCT: 38.4 % — ABNORMAL LOW (ref 39.0–52.0)
MCHC: 31.5 g/dL (ref 30.0–36.0)
RDW: 14.1 % (ref 11.5–15.5)

## 2012-07-29 MED ORDER — IPRATROPIUM BROMIDE 0.02 % IN SOLN
0.5000 mg | Freq: Four times a day (QID) | RESPIRATORY_TRACT | Status: DC
  Administered 2012-07-29 – 2012-07-30 (×4): 0.5 mg via RESPIRATORY_TRACT
  Filled 2012-07-29 (×4): qty 2.5

## 2012-07-29 MED ORDER — LEVALBUTEROL HCL 0.63 MG/3ML IN NEBU
0.6300 mg | INHALATION_SOLUTION | Freq: Four times a day (QID) | RESPIRATORY_TRACT | Status: DC
  Administered 2012-07-29 – 2012-07-30 (×4): 0.63 mg via RESPIRATORY_TRACT
  Filled 2012-07-29 (×8): qty 3

## 2012-07-29 MED ORDER — INSULIN ASPART 100 UNIT/ML ~~LOC~~ SOLN: 0.0000 [IU] | Freq: Every day | SUBCUTANEOUS | Status: DC

## 2012-07-29 MED ORDER — DILTIAZEM HCL ER COATED BEADS 240 MG PO CP24
240.0000 mg | ORAL_CAPSULE | Freq: Every day | ORAL | Status: DC
  Administered 2012-07-29 – 2012-08-03 (×6): 240 mg via ORAL
  Filled 2012-07-29 (×8): qty 1

## 2012-07-29 MED ORDER — LEVALBUTEROL HCL 0.63 MG/3ML IN NEBU
0.6300 mg | INHALATION_SOLUTION | RESPIRATORY_TRACT | Status: DC | PRN
  Filled 2012-07-29 (×3): qty 3

## 2012-07-29 MED ORDER — NICOTINE 21 MG/24HR TD PT24
21.0000 mg | MEDICATED_PATCH | Freq: Every day | TRANSDERMAL | Status: DC
  Administered 2012-07-29 – 2012-08-08 (×11): 21 mg via TRANSDERMAL
  Filled 2012-07-29 (×11): qty 1

## 2012-07-29 MED ORDER — INSULIN ASPART 100 UNIT/ML ~~LOC~~ SOLN
0.0000 [IU] | Freq: Three times a day (TID) | SUBCUTANEOUS | Status: DC
  Administered 2012-07-29: 3 [IU] via SUBCUTANEOUS
  Administered 2012-07-29: 5 [IU] via SUBCUTANEOUS
  Administered 2012-07-30: 2 [IU] via SUBCUTANEOUS
  Administered 2012-07-30 (×2): 3 [IU] via SUBCUTANEOUS
  Administered 2012-07-31 – 2012-08-03 (×9): 2 [IU] via SUBCUTANEOUS
  Administered 2012-08-04 – 2012-08-05 (×2): 3 [IU] via SUBCUTANEOUS
  Administered 2012-08-05: 2 [IU] via SUBCUTANEOUS
  Administered 2012-08-05: 3 [IU] via SUBCUTANEOUS
  Administered 2012-08-06: 5 [IU] via SUBCUTANEOUS
  Administered 2012-08-06 – 2012-08-08 (×3): 2 [IU] via SUBCUTANEOUS

## 2012-07-29 MED ORDER — BUDESONIDE 0.25 MG/2ML IN SUSP
0.2500 mg | Freq: Four times a day (QID) | RESPIRATORY_TRACT | Status: DC
  Administered 2012-07-29 – 2012-08-02 (×14): 0.25 mg via RESPIRATORY_TRACT
  Filled 2012-07-29 (×23): qty 2

## 2012-07-29 NOTE — Progress Notes (Signed)
PULMONARY  / CRITICAL CARE MEDICINE  Name: [name] MRN: [id number] DOB: 1940/02/09    ADMISSION DATE:  07/28/2012 CONSULTATION DATE:  07/28/12  REFERRING MD :  Fonnie Jarvis PRIMARY SERVICE:  PCCM  CHIEF COMPLAINT:  Short of Breath  BRIEF PATIENT DESCRIPTION: 67 yoM smoker with known COPD, CAD, AFib, OSA presenting through ED with 3 days of increasing dyspnea he blames on smoking too much. Placed on BIPAP in ED. PCCM called to admit. Wife here.  SIGNIFICANT EVENTS / STUDIES:    LINES / TUBES:   CULTURES:   ANTIBIOTICS: Rocephin 6/28 >>    SUBJECTIVE:  Tolerates off BiPAP. Feels much better.   VITAL SIGNS: Temp:  [97 F (36.1 C)-97.8 F (36.6 C)] 97.8 F (36.6 C) (06/29 1608) Pulse Rate:  [68-120] 96 (06/29 1700) Resp:  [17-32] 26 (06/29 1700) BP: (96-140)/(57-123) 109/59 mmHg (06/29 1700) SpO2:  [91 %-100 %] 97 % (06/29 1700) FiO2 (%):  [30 %-50 %] 30 % (06/29 0800) Weight:  [90.7 kg (199 lb 15.3 oz)] 90.7 kg (199 lb 15.3 oz) (06/29 0500)  PHYSICAL EXAMINATION: General:  NAD Neuro: Fully oriented, moving all ext, PERRLA HEENT:  WNL Neck: No JVD Cardiovascular:  IRIR, no M noted Lungs:  No wheezes, diminished BS throughout Abdomen:  Soft, nontender, BS+ Ext: no edema    Recent Labs Lab 07/28/12 0913 07/28/12 1244 07/29/12 0520  NA 141 139 139  K 5.0 5.1 5.1  CL 101 102 105  CO2 30 27 27   BUN 29* 29* 42*  CREATININE 1.77* 1.72* 1.89*  GLUCOSE 124* 160* 148*    Recent Labs Lab 07/28/12 0913 07/28/12 1244 07/29/12 0520  HGB 13.6 12.7* 12.1*  HCT 42.1 38.8* 38.4*  WBC 11.2* 11.0* 9.5  PLT 124* 124* 121*   CXR: NNF  ASSESSMENT: Acute on Chronic Resp Failure Acute exacerbation of O2 dependent COPD Ongoing tobacco abuse despite counseling.  CAD, no CP currently Chronic AFib    Change NPPV to PRN Cont BDs Cont systemic steroids Begin diet SSI coverage while on systemic steroids Counseled re: smoking cessation Cont home cardiac  meds   Billy Fischer, MD ; [facility] service Mobile [phone].  After 5:30 PM or weekends, call [phone]

## 2012-07-30 DIAGNOSIS — I4891 Unspecified atrial fibrillation: Secondary | ICD-10-CM

## 2012-07-30 DIAGNOSIS — N179 Acute kidney failure, unspecified: Secondary | ICD-10-CM

## 2012-07-30 LAB — POCT I-STAT 3, ART BLOOD GAS (G3+)
Acid-base deficit: 3 mmol/L — ABNORMAL HIGH (ref 0.0–2.0)
Bicarbonate: 24.6 mEq/L — ABNORMAL HIGH (ref 20.0–24.0)
O2 Saturation: 87 %
Patient temperature: 97.8
TCO2: 26 mmol/L (ref 0–100)
pH, Arterial: 7.272 — ABNORMAL LOW (ref 7.350–7.450)

## 2012-07-30 LAB — GLUCOSE, CAPILLARY: Glucose-Capillary: 142 mg/dL — ABNORMAL HIGH (ref 70–99)

## 2012-07-30 LAB — URINE CULTURE: Culture: NO GROWTH

## 2012-07-30 MED ORDER — METHYLPREDNISOLONE SODIUM SUCC 125 MG IJ SOLR
60.0000 mg | Freq: Two times a day (BID) | INTRAMUSCULAR | Status: DC
  Administered 2012-07-30 – 2012-07-31 (×3): 60 mg via INTRAVENOUS
  Filled 2012-07-30 (×4): qty 0.96

## 2012-07-30 MED ORDER — IPRATROPIUM BROMIDE 0.02 % IN SOLN
0.5000 mg | RESPIRATORY_TRACT | Status: DC
  Administered 2012-07-30 – 2012-08-02 (×18): 0.5 mg via RESPIRATORY_TRACT
  Filled 2012-07-30 (×17): qty 2.5

## 2012-07-30 MED ORDER — LEVALBUTEROL HCL 0.63 MG/3ML IN NEBU
0.6300 mg | INHALATION_SOLUTION | RESPIRATORY_TRACT | Status: DC
  Administered 2012-07-30 – 2012-08-02 (×18): 0.63 mg via RESPIRATORY_TRACT
  Filled 2012-07-30 (×28): qty 3

## 2012-07-30 NOTE — Significant Event (Signed)
Notified and spoken with Dr. Marchelle Gearing regarding latest ABG. New orders received. Per MD, patient can continue to be transfer and will need to be on BiPAP once in 3300. RN to enter orders in per MD. Will continue to monitor. Laryn Venning, Charity fundraiser.

## 2012-07-30 NOTE — Significant Event (Signed)
1210pm-patient traveled safely to [id number] via wheelchair. VS stable prior and during the transfer. All personal belongings (CPAP with mask, and patient's eye-glasses) are taken to his new room and are accounted for by patient, patient's spouse, and receiving RN Carollee Herter. Patient settled in bed. Report given to [facility]. Patient's spouse at bedside. Nikolette Reindl, Charity fundraiser.

## 2012-07-30 NOTE — Progress Notes (Addendum)
PULMONARY  / CRITICAL CARE MEDICINE  Name: [name] MRN: [id number] DOB: Aug 31, 1940 PCP Londell Moh, MD     ADMISSION DATE:  07/28/2012 CONSULTATION DATE:  07/28/12  REFERRING MD :  Fonnie Jarvis PRIMARY SERVICE:  PCCM  CHIEF COMPLAINT:  Short of Breath  BRIEF PATIENT DESCRIPTION: 66 yoM smoker with known COPD NOS followed by Dr Shelle Iron with only on nocturnal O222  , CAD, AFib, OSA on nocturnal CPAP with 2L o2 at night; presenting through ED with 3 days of increasing dyspnea he blames on smoking too much. Placed on BIPAP in ED. PCCM called to admit. Wife here.  SIGNIFICANT EVENTS / STUDIES:    LINES / TUBES:   CULTURES:   ANTIBIOTICS: Rocephin 6/28 >>    EVENTS 6/29: Tolerates off BiPAP. Feels much better.     SUBJECTIVE/OVERNIGHT/INTERVAL HX 07/30/12: Feels bettter. Sys near home baseline but then also says only "some" bette Able to transfer in and out of bed without problems but looks visibly dyspneic  VITAL SIGNS: Temp:  [97.6 F (36.4 C)-97.8 F (36.6 C)] 97.8 F (36.6 C) (06/30 0743) Pulse Rate:  [68-120] 96 (06/30 0800) Resp:  [16-32] 27 (06/30 0800) BP: (92-130)/(44-75) 130/72 mmHg (06/30 0800) SpO2:  [91 %-100 %] 100 % (06/30 0800) FiO2 (%):  [30 %] 30 % (06/30 0015) Weight:  [206 lb 12.7 oz (93.8 kg)] 206 lb 12.7 oz (93.8 kg) (06/30 0100)  PHYSICAL EXAMINATION: General:  NAD Neuro: Fully oriented, moving all ext, PERRLA HEENT:  WNL Neck: No JVD Cardiovascular:  IRIR, no M noted Lungs:  No wheezes, diminished BS throughout . Purse lip breathing +, Speaks sentences with difficulty Abdomen:  Soft, nontender, BS+ Ext: no edema    Recent Labs Lab 07/28/12 0913 07/28/12 1244 07/29/12 0520  NA 141 139 139  K 5.0 5.1 5.1  CL 101 102 105  CO2 30 27 27   BUN 29* 29* 42*  CREATININE 1.77* 1.72* 1.89*  GLUCOSE 124* 160* 148*    Recent Labs Lab 07/28/12 0913 07/28/12 1244 07/29/12 0520  HGB 13.6 12.7* 12.1*  HCT 42.1 38.8* 38.4*  WBC  11.2* 11.0* 9.5  PLT 124* 124* 121*    Recent Labs Lab 07/28/12 1251  PROBNP 5624.0*     Recent Labs Lab 07/28/12 1251 07/28/12 1705 07/29/12 0520  TROPONINI <0.30 <0.30 <0.30    IMAGING x 48h Dg Chest Port 1 View  07/28/2012   *RADIOLOGY REPORT*  Clinical Data: Shortness of breath.  Tobacco use.  COPD.  PORTABLE CHEST - 1 VIEW  Comparison: 06/01/2012  Findings: Increased coarsening of the lung interstitium.  Heart size within normal limits.  Atherosclerotic aortic arch noted.  Scarring noted at the left lung base.  IMPRESSION:  1.  Increased interstitial accentuation in the lungs suggesting atypical pneumonia or noncardiogenic edema.  No cardiomegaly. 2.  Emphysema.   Original Report Authenticated By: Gaylyn Rong, M.D.    ASSESSMENT: Acute on Chronic Resp Failure Acute exacerbation of O2 dependent COPD Ongoing tobacco abuse despite counseling.  CAD, no CP currently Chronic AFib     07/30/12: Clinically improved but still not out of woods. Still with class 4 dypnea. Also, creatinine rising  PLAN Check ABG Change NPPV to PRN + QHS Cont BDs but increase frequency Saline lock IV Check BNP 07/31/12 and if high diuerese Cont systemic steroids POn diet SSI coverage while on systemic steroids Counseled re: smoking cessation Cont home cardiac meds\ PT consult Anticipate another 48h in hospital Change steroids and antibiotics  to PO on 07/31/12    Move to SDU due to class 4 dyspnea    Dr. Kalman Shan, M.D., [facility].C.P Pulmonary and Critical Care Medicine Staff Physician [facility] Pulmonary and Critical Care Pager: [phone], If no answer or between  15:00h - 7:00h: call [phone]  07/30/2012 8:47 AM

## 2012-07-30 NOTE — Progress Notes (Signed)
Physical Therapy Evaluation Patient Details Name: [name] MRN: [id number] DOB: [birth year] Today's Date: 07/30/2012 Time: 7829-5621 PT Time Calculation (min): 15 min  PT Assessment / Plan / Recommendation History of Present Illness  Pt is a smoker admitted with SOB with need for Bipap secondary to poor oxygenation and poor ABG's.    Clinical Impression  Pt limited today due to SOB with minimal activity with need for Bipap.  Decr safety noted as pt was standing EOB on arrival without nursing present.  Will need PT to incr endurance and progress to prior functional level.  Hope that pt will progress to home with HHPT.      PT Assessment  Patient needs continued PT services    Follow Up Recommendations  Home health PT;Supervision/Assistance - 24 hour                Equipment Recommendations  Other (comment) (TBA)         Frequency Min 3X/week    Precautions / Restrictions Precautions Precautions: Fall Restrictions Weight Bearing Restrictions: No   Pertinent Vitals/Pain On arrival, pt standing at EOB and trying to get to bathroom.  His pulse ox was off.  When pulse ox placed back on, reading 83%.  Once pt back in bed comfortable, his sats were 92%.  Nursing aware and bed alarm in place.  No pain per pt.        Mobility  Bed Mobility Bed Mobility: Supine to Sit Supine to Sit: 7: Independent Details for Bed Mobility Assistance: Pt standing at EOB on arrival with Bipap attachment on face and attempting to walk to bathroom.  PT assisted pt back to bed and got pt a urinal so he could urinate.  Nursing came in meantime and determined to lie pt back down and place bed alarm on as pt with poor ABG''s and nursing felt it best that pt stay in bed.  Bed level eval completed.   Transfers Transfers: Sit to Stand;Stand to Sit Sit to Stand: 7: Independent Stand to Sit: 5: Supervision Details for Transfer Assistance: As above, pt got to standing position by himself.    Ambulation/Gait Ambulation/Gait Assistance: Not tested (comment) Stairs: No Wheelchair Mobility Wheelchair Mobility: No         PT Diagnosis: Generalized weakness  PT Problem List: Decreased activity tolerance;Decreased balance;Decreased mobility;Decreased knowledge of use of DME;Decreased safety awareness;Decreased knowledge of precautions PT Treatment Interventions: Gait training;DME instruction;Functional mobility training;Balance training;Therapeutic exercise;Therapeutic activities;Patient/family education     PT Goals(Current goals can be found in the care plan section) Acute Rehab PT Goals Patient Stated Goal: to go home PT Goal Formulation: With patient Time For Goal Achievement: 08/06/12 Potential to Achieve Goals: Good  Visit Information  Last PT Received On: 07/30/12 Assistance Needed: +2 History of Present Illness: Pt is a smoker admitted with SOB with need for Bipap secondary to poor oxygenation and poor ABG's.         Prior Functioning  Home Living Family/patient expects to be discharged to:: Private residence Living Arrangements: Spouse/significant other Available Help at Discharge: Family;Available 24 hours/day Additional Comments: No family to answer questions and pt with bipap mask on Prior Function Level of Independence: Independent Communication Communication: Other (comment) (Bipap mask in place - difficult to assess)    Cognition  Cognition Arousal/Alertness: Awake/alert Behavior During Therapy: Restless Overall Cognitive Status: Difficult to assess Difficult to assess due to:  (Bipap in place)    Extremity/Trunk Assessment Lower Extremity Assessment Lower Extremity Assessment: Generalized  weakness Cervical / Trunk Assessment Cervical / Trunk Assessment: Normal   Balance Static Standing Balance Static Standing - Balance Support: No upper extremity supported;During functional activity Static Standing - Level of Assistance: 5: Stand by  assistance Static Standing - Comment/# of Minutes: 3  End of Session PT - End of Session Equipment Utilized During Treatment: Gait belt;Oxygen Activity Tolerance: Patient limited by fatigue Patient left: in bed;with call bell/phone within reach;with nursing/sitter in room;with bed alarm set Nurse Communication: Mobility status       INGOLD,Oney Tatlock 07/30/2012, 4:37 PM [facility] Acute Rehabilitation [phone] (pager)

## 2012-07-30 NOTE — Clinical Documentation Improvement (Signed)
THIS DOCUMENT IS NOT A PERMANENT PART OF THE MEDICAL RECORD  Please update your documentation with the medical record to reflect your response to this query. If you need help knowing how to do this please call [phone].  07/30/12  Dear Dr. Marchelle Gearing / Associates  In a better effort to capture your patient's severity of illness, reflect appropriate length of stay and utilization of resources, a review of the patient medical record has revealed the following indicators.    Based on your clinical judgment, please clarify and document in a progress note and/or discharge summary the clinical condition associated with the following supporting information:  In responding to this query please exercise your independent judgment.  The fact that a query is asked, does not imply that any particular answer is desired or expected.     Possible Clinical Conditions?   Bacterial pneumonia, specify type if known (POA?) _______Klebsiella PNA _______E Coli PNA _______Pseudomonas PNA _______Candidiasis PNA _______Staph/MRSA PNA _______Strep PNA _______Pneumococcal PNA _______H Influenza PNA _______H Para Influenza PNA  _______Viral PNA (POA?)  _______Pneumonia (CAP, HAP) (POA?) _______Other Condition _______Cannot Clinically Determine       Risk Factors:  Community acquired pneumonia noted per 6/29 ED note. Atypical pneumonia noted per 6/30 progress notes.  Treatment: Rocephin 1 gram IVPB q 24hr.  You may use possible, probable, or suspect with inpatient documentation. possible, probable, suspected diagnoses MUST be documented at the time of discharge  Reviewed:  Additional documentation provided per 7/08 progress notes.                Thank Sabino Donovan  Clinical Documentation Specialist: [phone] Health Information Management [place]

## 2012-07-30 NOTE — Telephone Encounter (Signed)
ATC pt NA-- then I looked in pt chart saw he is currently admitted. Will sign off message

## 2012-07-31 LAB — BASIC METABOLIC PANEL
GFR calc Af Amer: 39 mL/min — ABNORMAL LOW (ref >90)
GFR calc non Af Amer: 34 mL/min — ABNORMAL LOW (ref >90)
Glucose, Bld: 140 mg/dL — ABNORMAL HIGH (ref 70–99)
Potassium: 4.9 mEq/L (ref 3.5–5.1)
Sodium: 137 mEq/L (ref 135–145)

## 2012-07-31 LAB — GLUCOSE, CAPILLARY: Glucose-Capillary: 134 mg/dL — ABNORMAL HIGH (ref 70–99)

## 2012-07-31 LAB — CBC
Hemoglobin: 11 g/dL — ABNORMAL LOW (ref 13.0–17.0)
MCH: 29.3 pg (ref 26.0–34.0)
MCHC: 32.1 g/dL (ref 30.0–36.0)
MCV: 91.5 fL (ref 78.0–100.0)
RBC: 3.75 MIL/uL — ABNORMAL LOW (ref 4.22–5.81)

## 2012-07-31 MED ORDER — PREDNISONE 50 MG PO TABS
50.0000 mg | ORAL_TABLET | Freq: Every day | ORAL | Status: DC
  Administered 2012-08-01 – 2012-08-03 (×3): 50 mg via ORAL
  Filled 2012-07-31 (×4): qty 1

## 2012-07-31 MED ORDER — FUROSEMIDE 10 MG/ML IJ SOLN: 20.0000 mg | Freq: Once | INTRAMUSCULAR | Status: AC

## 2012-07-31 MED ORDER — LEVOFLOXACIN 500 MG PO TABS
500.0000 mg | ORAL_TABLET | Freq: Every day | ORAL | Status: AC
  Administered 2012-07-31 – 2012-08-03 (×4): 500 mg via ORAL
  Filled 2012-07-31 (×4): qty 1

## 2012-07-31 MED ORDER — FUROSEMIDE 10 MG/ML IJ SOLN
INTRAMUSCULAR | Status: AC
  Administered 2012-07-31: 20 mg via INTRAVENOUS
  Filled 2012-07-31: qty 4

## 2012-07-31 NOTE — Progress Notes (Signed)
Physical Therapy Treatment Patient Details Name: [name] MRN: [id number] DOB: 08/02/1940 Today's Date: 07/31/2012 Time: 1435-1500 PT Time Calculation (min): 25 min  PT Assessment / Plan / Recommendation  PT Comments   Progressing with ability to walk in hallway today.  Still dyspneic with one episode desaturation < 90 while on 2L  O2.  Has flight of steps usually takes multiple times per day.  Began conditioning to perform today with sit<>stand activities.  May not need HHPT follow up, but likely more appropriate for pulmonary rehab when stable per MD.  Follow Up Recommendations  Supervision/Assistance - 24 hour;No PT follow up (may benefit from pulmonary rehab when able to participate)           Equipment Recommendations  None recommended by PT    Recommendations for Other Services  None  Frequency Min 3X/week   Progress towards PT Goals Progress towards PT goals: Progressing toward goals  Plan Discharge plan needs to be updated    Precautions / Restrictions Precautions Precautions: Fall Precaution Comments: due to slightly impulsive   Pertinent Vitals/Pain No pain complaints, HR 107, SpO2 87-92 on 2L O2 with ambulation    Mobility  Bed Mobility Bed Mobility: Sit to Supine Supine to Sit: 6: Modified independent (Device/Increase time);HOB elevated Sit to Supine: 6: Modified independent (Device/Increase time);HOB elevated Transfers Sit to Stand: From bed;5: Supervision Stand to Sit: To bed;5: Supervision Details for Transfer Assistance: for safety and line management Ambulation/Gait Ambulation/Gait Assistance: 5: Supervision Ambulation Distance (Feet): 150 Feet Assistive device: Other (Comment) (pushing wheelchair) Ambulation/Gait Assistance Details: increased speed, slightly flexed due to pushing wheelchair Gait Pattern: Step-through pattern;Trunk flexed    Exercises Other Exercises Other Exercises: sit<>stand with UE assist x 10 reps     PT Goals (current  goals can now be found in the care plan section)    Visit Information  Last PT Received On: 07/31/12 Assistance Needed: +1    Subjective Data   Patient reports has portable O2 generator.  Plans to have wife bring in to check output.   Cognition  Cognition Arousal/Alertness: Awake/alert Behavior During Therapy: WFL for tasks assessed/performed Overall Cognitive Status: Within Functional Limits for tasks assessed    Balance  Static Standing Balance Static Standing - Balance Support: No upper extremity supported Static Standing - Level of Assistance: 5: Stand by assistance  End of Session PT - End of Session Equipment Utilized During Treatment: Oxygen Activity Tolerance: Patient tolerated treatment well Patient left: in bed;with call bell/phone within reach;with family/visitor present Nurse Communication: Mobility status   GP     [facility] 07/31/2012, 3:13 PM Sheran Lawless, PT [phone] 07/31/2012

## 2012-07-31 NOTE — Progress Notes (Signed)
Pt was on BIPAP when rt went to do neb tx. Pt and vitals are stable. RT will follow as needed.

## 2012-07-31 NOTE — Progress Notes (Signed)
PULMONARY  / CRITICAL CARE MEDICINE  Name: [name] MRN: [id number] DOB: [birth year] PCP Londell Moh, MD     ADMISSION DATE:  07/28/2012 CONSULTATION DATE:  07/28/12  REFERRING MD :  Fonnie Jarvis PRIMARY SERVICE:  PCCM  CHIEF COMPLAINT:  Short of Breath  BRIEF PATIENT DESCRIPTION: 2 yoM smoker with known COPD NOS followed by Dr Shelle Iron with only on nocturnal O222  , CAD, AFib, OSA on nocturnal CPAP with 2L o2 at night; presenting through ED with 3 days of increasing dyspnea he blames on smoking too much. Placed on BIPAP in ED. PCCM called to admit. Wife here.  SIGNIFICANT EVENTS / STUDIES:    LINES / TUBES:   CULTURES:   ANTIBIOTICS: Rocephin 6/28 >>    EVENTS 6/29: Tolerates off BiPAP. Feels much better.  07/30/12: Still with resp acidosos. Moved to SDU but kept on bippa   SUBJECTIVE/OVERNIGHT/INTERVAL HX 07/31/12: Doing bipap 4h on/2h off day time + QHS. Better per huim but wife says still very dyspneic when bipap removed. Wants to eat  VITAL SIGNS: Temp:  [97.1 F (36.2 C)-98.1 F (36.7 C)] 98.1 F (36.7 C) (07/01 0800) Pulse Rate:  [52-103] 103 (07/01 1000) Resp:  [15-26] 15 (07/01 1000) BP: (110-126)/(57-86) 123/86 mmHg (07/01 0800) SpO2:  [93 %-100 %] 99 % (07/01 1000) FiO2 (%):  [30 %-50 %] 30 % (07/01 1000) Weight:  [93.5 kg (206 lb 2.1 oz)] 93.5 kg (206 lb 2.1 oz) (07/01 0500)  PHYSICAL EXAMINATION: General:  NAD Neuro: Fully oriented, moving all ext, PERRLA HEENT:  WNL Neck: No JVD Cardiovascular:  IRIR, no M noted Lungs:  No wheezes, diminished BS throughout . Looiks improved but has bipap on Abdomen:  Soft, nontender, BS+ Ext: no edema    Recent Labs Lab 07/28/12 1244 07/29/12 0520 07/31/12 0535  NA 139 139 137  K 5.1 5.1 4.9  CL 102 105 102  CO2 27 27 28   BUN 29* 42* 55*  CREATININE 1.72* 1.89* 1.90*  GLUCOSE 160* 148* 140*    Recent Labs Lab 07/28/12 1244 07/29/12 0520 07/31/12 0535  HGB 12.7* 12.1* 11.0*  HCT 38.8*  38.4* 34.3*  WBC 11.0* 9.5 10.5  PLT 124* 121* 137*    Recent Labs Lab 07/28/12 1251 07/31/12 0535  PROBNP 5624.0* 3699.0*      Recent Labs Lab 07/28/12 1251 07/28/12 1705 07/29/12 0520  TROPONINI <0.30 <0.30 <0.30    Recent Labs Lab 07/28/12 1730 07/28/12 2042 07/28/12 2355 07/30/12 1023  PHART 7.149* 7.143* 7.199* 7.272*  PCO2ART 83.0* 86.9* 69.8* 52.9*  PO2ART 81.8 132.0* 83.8 59.0*  HCO3 27.7* 29.8* 26.1* 24.6*  TCO2 30.2 32 28.3 26  O2SAT 95.0 98.0 97.1 87.0    IMAGING x 48h No results found.  ASSESSMENT: Acute on Chronic Resp Failure Acute exacerbation of O2 dependent COPD Ongoing tobacco abuse despite counseling.  CAD, no CP currently Chronic AFib     07/31/12: Creat still rising. Doing better with bipap  PLAN \\Continue  day time bipa but reduce to 3h on/3h off + QHS  BDs  Change to oral pred Change IV abx to po levaquin Oral diet Saline lock IV Start lasix PO diet SSI coverage while on systemic steroids Cont home cardiac meds\ PT consult   Keep in  SDU due to class 4 dyspnea    Dr. Kalman Shan, M.D., [facility].C.P Pulmonary and Critical Care Medicine Staff Physician [facility] [place] [facility] Pager: [phone], If no answer or between  15:00h - 7:00h: call [phone]  07/31/2012 10:23 AM

## 2012-08-01 ENCOUNTER — Telehealth: Payer: Self-pay | Admitting: Cardiovascular Disease

## 2012-08-01 LAB — BLOOD GAS, ARTERIAL
Drawn by: 12971
O2 Content: 2 L/min
O2 Saturation: 96.7 %
pCO2 arterial: 53 mmHg — ABNORMAL HIGH (ref 35.0–45.0)
pO2, Arterial: 77.1 mmHg — ABNORMAL LOW (ref 80.0–100.0)

## 2012-08-01 LAB — GLUCOSE, CAPILLARY
Glucose-Capillary: 145 mg/dL — ABNORMAL HIGH (ref 70–99)
Glucose-Capillary: 124 mg/dL — ABNORMAL HIGH (ref 70–99)

## 2012-08-01 LAB — PRO B NATRIURETIC PEPTIDE: Pro B Natriuretic peptide (BNP): 4309 pg/mL — ABNORMAL HIGH (ref 0–125)

## 2012-08-01 LAB — BASIC METABOLIC PANEL
Chloride: 100 mEq/L (ref 96–112)
GFR calc Af Amer: 41 mL/min — ABNORMAL LOW (ref >90)
Potassium: 5.4 mEq/L — ABNORMAL HIGH (ref 3.5–5.1)
Sodium: 139 mEq/L (ref 135–145)

## 2012-08-01 MED ORDER — FUROSEMIDE 10 MG/ML IJ SOLN
INTRAMUSCULAR | Status: AC
  Filled 2012-08-01: qty 4

## 2012-08-01 MED ORDER — FUROSEMIDE 10 MG/ML IJ SOLN
20.0000 mg | Freq: Once | INTRAMUSCULAR | Status: AC
  Administered 2012-08-01: 20 mg via INTRAVENOUS
  Filled 2012-08-01: qty 2

## 2012-08-01 NOTE — Telephone Encounter (Signed)
C-

## 2012-08-01 NOTE — Progress Notes (Signed)
PT Cancellation Note  Patient Details Name: [name] MRN: [id number] DOB: Dec 22, 1940   Cancelled Treatment:    Reason Eval/Treat Not Completed: Pain limiting ability to participate; c/o heartburn.  Offered to ask RN for meds and pt declined.  Will attempt tomorrow.   Miner Koral,CYNDI 08/01/2012, 4:05 PM

## 2012-08-01 NOTE — Telephone Encounter (Signed)
[name] is calling because [name] is in the hospital with pnuemonia and lungs are bad and he is schedule to have some type of procedure , they are not sure what the procedure is and they are not sure when can he have it done because he is in the hospital and not sure when he will be out .Marland KitchenPlease call..  Thanks

## 2012-08-01 NOTE — Progress Notes (Signed)
PULMONARY  / CRITICAL CARE MEDICINE  Name: [name] MRN: [id number] DOB: 05-12-1940 PCP Londell Moh, MD     ADMISSION DATE:  07/28/2012 CONSULTATION DATE:  07/28/12  REFERRING MD :  Fonnie Jarvis PRIMARY SERVICE:  PCCM  CHIEF COMPLAINT:  Short of Breath  BRIEF PATIENT DESCRIPTION: 57 yoM smoker with known COPD NOS followed by Dr Shelle Iron with only on nocturnal O222  , CAD, AFib, OSA on nocturnal CPAP with 2L o2 at night; presenting through ED with 3 days of increasing dyspnea he blames on smoking too much. Placed on BIPAP in ED. PCCM called to admit. Wife here.  SIGNIFICANT EVENTS / STUDIES:    LINES / TUBES:   CULTURES:   ANTIBIOTICS: Rocephin 6/28 >>    EVENTS 6/29: Tolerates off BiPAP. Feels much better.   \07/30/12: Still with resp acidosos. Moved to SDU but kept on bippa  07/31/12: Doing bipap 4h on/2h off day time + QHS. Better per huim but wife says still very dyspneic when bipap removed. Wants to eat. Creatinine rising    SUBJECTIVE/OVERNIGHT/INTERVAL HX  08/01/12: Improved. Still on and off bipap day time. Concerned about upcoming cardioversion 08/14/12   VITAL SIGNS: Temp:  [96.5 F (35.8 C)-98 F (36.7 C)] 97.4 F (36.3 C) (07/02 1108) Pulse Rate:  [77-139] 91 (07/02 1015) Resp:  [18-29] 22 (07/02 1015) BP: (98-137)/(56-74) 125/59 mmHg (07/02 0910) SpO2:  [87 %-100 %] 100 % (07/02 1015) FiO2 (%):  [30 %] 30 % (07/02 1015) Weight:  [92.6 kg (204 lb 2.3 oz)] 92.6 kg (204 lb 2.3 oz) (07/02 0500)  PHYSICAL EXAMINATION: General:  NAD Neuro: Fully oriented, moving all ext, PERRLA HEENT:  WNL Neck: No JVD Cardiovascular:  IRIR, no M noted Lungs:  No wheezes, diminished BS throughout . Looiks improved but has bipap on Abdomen:  Soft, nontender, BS+ Ext: no edema    Recent Labs Lab 07/29/12 0520 07/31/12 0535 08/01/12 0500  NA 139 137 139  K 5.1 4.9 5.4*  CL 105 102 100  CO2 27 28 32  BUN 42* 55* 56*  CREATININE 1.89* 1.90* 1.82*   GLUCOSE 148* 140* 163*    Recent Labs Lab 07/28/12 1244 07/29/12 0520 07/31/12 0535  HGB 12.7* 12.1* 11.0*  HCT 38.8* 38.4* 34.3*  WBC 11.0* 9.5 10.5  PLT 124* 121* 137*    Recent Labs Lab 07/28/12 1251 07/31/12 0535 08/01/12 0500  PROBNP 5624.0* 3699.0* 4309.0*      Recent Labs Lab 07/28/12 1251 07/28/12 1705 07/29/12 0520  TROPONINI <0.30 <0.30 <0.30    Recent Labs Lab 07/28/12 1730 07/28/12 2042 07/28/12 2355 07/30/12 1023  PHART 7.149* 7.143* 7.199* 7.272*  PCO2ART 83.0* 86.9* 69.8* 52.9*  PO2ART 81.8 132.0* 83.8 59.0*  HCO3 27.7* 29.8* 26.1* 24.6*  TCO2 30.2 32 28.3 26  O2SAT 95.0 98.0 97.1 87.0    IMAGING x 48h No results found.  ASSESSMENT: Acute on Chronic Resp Failure Acute exacerbation of O2 dependent COPD Ongoing tobacco abuse despite counseling.  CAD, no CP currently Chronic AFib     721/14: Creat now better after lasix. Suspect some acute on chronic diast dysfunction. Doing better with bipap  PLAN DC daytime bipap.  Do bipap  QHS  BDs  oral pred po levaquin Oral diet Saline lock IV IV  lasix PO diet SSI coverage while on systemic steroids Cont home cardiac meds\ PT consult   Keep in  SDU due to class 4 dyspnea    Dr. Kalman Shan, M.D., [facility].C.P Pulmonary and  Critical Care Medicine Staff Physician [place] [facility] [place] Pulmonary and Critical Care Pager: [phone], If no answer or between  15:00h - 7:00h: call [phone]  08/01/2012 11:49 AM

## 2012-08-01 NOTE — Progress Notes (Signed)
Pt's HR up to 140s-150s while sitting up eating. Pt asymptomatic.  BP 123/74.  Dr. Vassie Loll notified.  Pt now resting in bed HR 100s-120s.  No new orders received.  Instructed to call back if HR sustains >130.  Roselie Awkward, RN

## 2012-08-02 LAB — BASIC METABOLIC PANEL
Calcium: 9.5 mg/dL (ref 8.4–10.5)
Creatinine, Ser: 1.88 mg/dL — ABNORMAL HIGH (ref 0.50–1.35)
GFR calc Af Amer: 40 mL/min — ABNORMAL LOW (ref >90)
GFR calc non Af Amer: 34 mL/min — ABNORMAL LOW (ref >90)

## 2012-08-02 LAB — GLUCOSE, CAPILLARY: Glucose-Capillary: 139 mg/dL — ABNORMAL HIGH (ref 70–99)

## 2012-08-02 LAB — MAGNESIUM: Magnesium: 1.7 mg/dL (ref 1.5–2.5)

## 2012-08-02 LAB — TROPONIN I
Troponin I: <0.3 ng/mL (ref <0.30)
Troponin I: <0.3 ng/mL (ref <0.30)

## 2012-08-02 MED ORDER — BUDESONIDE 0.25 MG/2ML IN SUSP
0.2500 mg | Freq: Two times a day (BID) | RESPIRATORY_TRACT | Status: DC
  Administered 2012-08-03 – 2012-08-08 (×9): 0.25 mg via RESPIRATORY_TRACT
  Filled 2012-08-02 (×15): qty 2

## 2012-08-02 MED ORDER — IPRATROPIUM BROMIDE 0.02 % IN SOLN
0.5000 mg | Freq: Four times a day (QID) | RESPIRATORY_TRACT | Status: DC
  Administered 2012-08-02 – 2012-08-04 (×9): 0.5 mg via RESPIRATORY_TRACT
  Filled 2012-08-02 (×8): qty 2.5

## 2012-08-02 MED ORDER — MAGNESIUM SULFATE IN D5W 10-5 MG/ML-% IV SOLN
1.0000 g | Freq: Once | INTRAVENOUS | Status: AC
  Administered 2012-08-02: 1 g via INTRAVENOUS
  Filled 2012-08-02: qty 100

## 2012-08-02 MED ORDER — DEXTROSE 5 % IV SOLN
5.0000 mg/h | INTRAVENOUS | Status: DC
  Administered 2012-08-02 – 2012-08-03 (×2): 5 mg/h via INTRAVENOUS
  Filled 2012-08-02: qty 100

## 2012-08-02 MED ORDER — LEVALBUTEROL HCL 0.63 MG/3ML IN NEBU
0.6300 mg | INHALATION_SOLUTION | Freq: Four times a day (QID) | RESPIRATORY_TRACT | Status: DC
  Administered 2012-08-02 – 2012-08-04 (×9): 0.63 mg via RESPIRATORY_TRACT
  Filled 2012-08-02 (×13): qty 3

## 2012-08-02 MED ORDER — FUROSEMIDE 10 MG/ML IJ SOLN
40.0000 mg | Freq: Two times a day (BID) | INTRAMUSCULAR | Status: DC
  Administered 2012-08-02 – 2012-08-03 (×3): 40 mg via INTRAVENOUS
  Filled 2012-08-02 (×5): qty 4

## 2012-08-02 MED ORDER — POTASSIUM CHLORIDE CRYS ER 20 MEQ PO TBCR
20.0000 meq | EXTENDED_RELEASE_TABLET | Freq: Two times a day (BID) | ORAL | Status: DC
  Administered 2012-08-02 – 2012-08-07 (×11): 20 meq via ORAL
  Filled 2012-08-02 (×12): qty 1

## 2012-08-02 NOTE — Progress Notes (Signed)
Pts HR sustaining in the 140's. He is sitting up eating his breakfast and asymptomatic. MD paged and gave orders for EKG and he will round this pt as soon as able. Will continue to monitor.

## 2012-08-02 NOTE — Progress Notes (Signed)
RT placed patient on cpap 11cmH20 via full face mask with 30% Fi02. Patient is tolerating cpap well at this time.

## 2012-08-02 NOTE — Progress Notes (Signed)
Physical Therapy Treatment Patient Details Name: [name] MRN: [id number] DOB: 02-05-1940 Today's Date: 08/02/2012 Time: 8657-8469 PT Time Calculation (min): 21 min  PT Assessment / Plan / Recommendation  PT Comments   Progressing with activity tolerance able to walk further today.  Still dyspneic on 2L O2 with Sats down to 86%.  Needs to attempt stairs next session.  Follow Up Recommendations  Supervision/Assistance - 24 hour;No PT follow up     Does the patient have the potential to tolerate intense rehabilitation   N/A  Barriers to Discharge  Stairs to bedrooms in home.      Equipment Recommendations  None recommended by PT    Recommendations for Other Services  None  Frequency Min 3X/week   Progress towards PT Goals Progress towards PT goals: Progressing toward goals  Plan Current plan remains appropriate    Precautions / Restrictions Precautions Precautions: Fall   Pertinent Vitals/Pain No pain complaints; SpO2 sown to 87% ambulating on 2L O2; HR 102 on Cardizem drip    Mobility  Bed Mobility Supine to Sit: 6: Modified independent (Device/Increase time);HOB elevated Sit to Supine: 6: Modified independent (Device/Increase time);HOB elevated Transfers Sit to Stand: From bed;5: Supervision Stand to Sit: To bed;5: Supervision Details for Transfer Assistance: for safety and line management Ambulation/Gait Ambulation/Gait Assistance: 5: Supervision Ambulation Distance (Feet): 400 Feet Assistive device: Other (Comment) (pushing wheelchair) Ambulation/Gait Assistance Details: increased speed, flexed due to pushing wheelchair Gait Pattern: Step-through pattern;Wide base of support;Trunk flexed      PT Goals (current goals can now be found in the care plan section)    Visit Information  Last PT Received On: 08/02/12    Subjective Data   I can't sit to urinate.   Cognition  Cognition Arousal/Alertness: Awake/alert Behavior During Therapy: WFL for tasks  assessed/performed Overall Cognitive Status: Within Functional Limits for tasks assessed    Balance  Static Standing Balance Static Standing - Balance Support: No upper extremity supported;During functional activity Static Standing - Level of Assistance: 5: Stand by assistance Static Standing - Comment/# of Minutes: standing to use urinal  End of Session PT - End of Session Equipment Utilized During Treatment: Gait belt;Oxygen Activity Tolerance: Patient limited by fatigue Patient left: in bed;with call bell/phone within reach Nurse Communication:  (desat on 2L O2)   GP     [name],CYNDI 08/02/2012, 5:01 PM Sheran Lawless, PT [phone] 08/02/2012

## 2012-08-02 NOTE — Progress Notes (Signed)
PULMONARY  / CRITICAL CARE MEDICINE  Name: [name] MRN: [id number] DOB: 12/28/1940 PCP Londell Moh, MD     ADMISSION DATE:  07/28/2012 CONSULTATION DATE:  07/28/12  REFERRING MD :  Fonnie Jarvis PRIMARY SERVICE:  PCCM  CHIEF COMPLAINT:  Short of Breath  BRIEF PATIENT DESCRIPTION: 56 yoM smoker with known COPD NOS followed by Dr Shelle Iron with only on nocturnal O222  , CAD, AFib, OSA on nocturnal CPAP with 2L o2 at night; presenting through ED with 3 days of increasing dyspnea he blames on smoking too much. Placed on BIPAP in ED. PCCM called to admit. Wife here.  SIGNIFICANT EVENTS / STUDIES:    LINES / TUBES:   CULTURES:   ANTIBIOTICS: Rocephin 6/28 >>    EVENTS 6/29: Tolerates off BiPAP. Feels much better.   \07/30/12: Still with resp acidosos. Moved to SDU but kept on bippa  07/31/12: Doing bipap 4h on/2h off day time + QHS. Better per huim but wife says still very dyspneic when bipap removed. Wants to eat. Creatinine rising    SUBJECTIVE/OVERNIGHT/INTERVAL HX  08/01/12: Improved. Still on and off bipap day time. Concerned about upcoming cardioversion 08/14/12   VITAL SIGNS: Temp:  [97.1 F (36.2 C)-98.4 F (36.9 C)] 97.1 F (36.2 C) (07/03 0700) Pulse Rate:  [91-127] 108 (07/03 0425) Resp:  [11-28] 21 (07/03 0700) BP: (109-141)/(60-97) 141/97 mmHg (07/03 0700) SpO2:  [87 %-100 %] 100 % (07/03 0847) FiO2 (%):  [30 %] 30 % (07/03 0332)  PHYSICAL EXAMINATION: General:  NAD Neuro: Fully oriented, moving all ext, PERRLA HEENT:  WNL Neck: No JVD Cardiovascular:  IRIR, no M noted Lungs:  No wheezes, diminished BS throughout . Looiks improved but has bipap on Abdomen:  Soft, nontender, BS+ Ext: ++o edema    Recent Labs Lab 07/31/12 0535 08/01/12 0500 08/02/12 0545  NA 137 139 141  K 4.9 5.4* 4.6  CL 102 100 98  CO2 28 32 33*  BUN 55* 56* 53*  CREATININE 1.90* 1.82* 1.88*  GLUCOSE 140* 163* 103*    Recent Labs Lab 07/28/12 1244  07/29/12 0520 07/31/12 0535 08/01/12 0500 08/02/12 0545  NA 139 139 137 139 141  K 5.1 5.1 4.9 5.4* 4.6  CL 102 105 102 100 98  CO2 27 27 28  32 33*  GLUCOSE 160* 148* 140* 163* 103*  BUN 29* 42* 55* 56* 53*  CREATININE 1.72* 1.89* 1.90* 1.82* 1.88*  CALCIUM 8.4 8.5 8.8 8.9 9.5  MG  --   --   --   --  1.7     Recent Labs Lab 07/28/12 1244 07/29/12 0520 07/31/12 0535  HGB 12.7* 12.1* 11.0*  HCT 38.8* 38.4* 34.3*  WBC 11.0* 9.5 10.5  PLT 124* 121* 137*    Recent Labs Lab 07/28/12 1251 07/31/12 0535 08/01/12 0500  PROBNP 5624.0* 3699.0* 4309.0*      Recent Labs Lab 07/28/12 1251 07/28/12 1705 07/29/12 0520  TROPONINI <0.30 <0.30 <0.30    Recent Labs Lab 07/28/12 1730 07/28/12 2042 07/28/12 2355 07/30/12 1023 08/01/12 1649  PHART 7.149* 7.143* 7.199* 7.272* 7.382  PCO2ART 83.0* 86.9* 69.8* 52.9* 53.0*  PO2ART 81.8 132.0* 83.8 59.0* 77.1*  HCO3 27.7* 29.8* 26.1* 24.6* 30.8*  TCO2 30.2 32 28.3 26 32.4  O2SAT 95.0 98.0 97.1 87.0 96.7    IMAGING x 48h No results found.  ASSESSMENT: Acute on Chronic Resp Failure Acute on Chronic renal insuff versus Worsened   CRI (baseline creat 1.08mg % last checked Augst 2012) Acute exacerbation  of O2 dependent COPD Ongoing tobacco abuse despite counseling.  CAD, no CP currently Chronic AFib    08/02/12: Creat stable. AECOPD improved without daytime bipap and just on nocturnal bipap. However, A Fib in RVR posslby due to diastolic dysfunction (high bnp, edema +)   PLAN #RESP DC  All bipap Do nocurnal CPAP BDs  oral pred; cut down to 30mg  per day on 08/03/12 po levaquin - ends 08/03/12  #A FIb and diast dysfuntion - lasix  - saline lock -start cardizem gtt - home cardiac meds  #General Oral diet Saline lock IV PO diet SSI coverage while on systemic steroids PT consult   Keep in  SDU  08/02/12: Wife and p0atient updated    Dr. Kalman Shan, M.D., [facility].C.P Pulmonary and Critical Care Medicine Staff  Physician [facility] [place] Pulmonary and Critical Care Pager: [phone], If no answer or between  15:00h - 7:00h: call [phone]  08/02/2012 10:06 AM

## 2012-08-03 ENCOUNTER — Inpatient Hospital Stay (HOSPITAL_COMMUNITY): Payer: Medicare Other

## 2012-08-03 DIAGNOSIS — F17200 Nicotine dependence, unspecified, uncomplicated: Secondary | ICD-10-CM

## 2012-08-03 DIAGNOSIS — G4733 Obstructive sleep apnea (adult) (pediatric): Secondary | ICD-10-CM

## 2012-08-03 LAB — GLUCOSE, CAPILLARY
Glucose-Capillary: 135 mg/dL — ABNORMAL HIGH (ref 70–99)
Glucose-Capillary: 147 mg/dL — ABNORMAL HIGH (ref 70–99)

## 2012-08-03 LAB — CULTURE, BLOOD (ROUTINE X 2)
Culture: NO GROWTH
Culture: NO GROWTH

## 2012-08-03 LAB — MAGNESIUM: Magnesium: 1.7 mg/dL (ref 1.5–2.5)

## 2012-08-03 LAB — TROPONIN I: Troponin I: <0.3 ng/mL (ref <0.30)

## 2012-08-03 MED ORDER — FUROSEMIDE 40 MG PO TABS
60.0000 mg | ORAL_TABLET | Freq: Two times a day (BID) | ORAL | Status: DC
  Administered 2012-08-03 – 2012-08-07 (×8): 60 mg via ORAL
  Filled 2012-08-03 (×11): qty 1

## 2012-08-03 MED ORDER — PREDNISONE 20 MG PO TABS
30.0000 mg | ORAL_TABLET | Freq: Every day | ORAL | Status: DC
  Administered 2012-08-04 – 2012-08-06 (×3): 30 mg via ORAL
  Filled 2012-08-03 (×4): qty 1

## 2012-08-03 NOTE — Progress Notes (Signed)
PULMONARY  / CRITICAL CARE MEDICINE  Name: [name] MRN: [id number] DOB: 1940-06-23 PCP Londell Moh, MD     ADMISSION DATE:  07/28/2012 CONSULTATION DATE:  07/28/12  REFERRING MD :  Fonnie Jarvis PRIMARY SERVICE:  PCCM  CHIEF COMPLAINT:  Short of Breath  BRIEF PATIENT DESCRIPTION: 29 yoM smoker with known COPD NOS followed by Dr Shelle Iron, hx CAD, AFib, OSA on nocturnal CPAP with 2L o2 at night; presenting through ED with 3 days of increasing dyspnea he blames on smoking too much. Placed on BIPAP in ED. PCCM called to admit.  SIGNIFICANT EVENTS / STUDIES:    LINES / TUBES:   CULTURES:   ANTIBIOTICS: Rocephin 6/28 >> 7/1 levaquin 7/1 >> 7/4  EVENTS   SUBJECTIVE/OVERNIGHT/INTERVAL HX Weaned off dilt gtt this am Wants to go home Concerned about upcoming cardioversion 08/14/12   VITAL SIGNS: Temp:  [97.6 F (36.4 C)-98.3 F (36.8 C)] 97.6 F (36.4 C) (07/04 0400) Pulse Rate:  [89-117] 104 (07/04 1236) Resp:  [14-32] 32 (07/04 1236) BP: (97-127)/(43-85) 103/63 mmHg (07/04 1236) SpO2:  [91 %-100 %] 95 % (07/04 1236) FiO2 (%):  [30 %] 30 % (07/04 0400)  Intake/Output Summary (Last 24 hours) at 08/03/12 1351 Last data filed at 08/03/12 1235  Gross per 24 hour  Intake    760 ml  Output   4350 ml  Net  -3590 ml   PHYSICAL EXAMINATION: General:  NAD Neuro: Fully oriented, moving all ext, PERRLA HEENT:  WNL Neck: No JVD Cardiovascular:  IRIR, no M noted Lungs:  B exp wheezes, diminished BS throughout .  Abdomen:  Soft, nontender, BS+ Ext: 1+ LE edema    Recent Labs Lab 07/31/12 0535 08/01/12 0500 08/02/12 0545  NA 137 139 141  K 4.9 5.4* 4.6  CL 102 100 98  CO2 28 32 33*  BUN 55* 56* 53*  CREATININE 1.90* 1.82* 1.88*  GLUCOSE 140* 163* 103*    Recent Labs Lab 07/28/12 1244 07/29/12 0520 07/31/12 0535 08/01/12 0500 08/02/12 0545 08/03/12 0300  NA 139 139 137 139 141  --   K 5.1 5.1 4.9 5.4* 4.6  --   CL 102 105 102 100 98  --   CO2  27 27 28  32 33*  --   GLUCOSE 160* 148* 140* 163* 103*  --   BUN 29* 42* 55* 56* 53*  --   CREATININE 1.72* 1.89* 1.90* 1.82* 1.88*  --   CALCIUM 8.4 8.5 8.8 8.9 9.5  --   MG  --   --   --   --  1.7 1.7  PHOS  --   --   --   --   --  3.7     Recent Labs Lab 07/28/12 1244 07/29/12 0520 07/31/12 0535  HGB 12.7* 12.1* 11.0*  HCT 38.8* 38.4* 34.3*  WBC 11.0* 9.5 10.5  PLT 124* 121* 137*    Recent Labs Lab 07/28/12 1251 07/31/12 0535 08/01/12 0500 08/03/12 0300  PROBNP 5624.0* 3699.0* 4309.0* 4088.0*      Recent Labs Lab 07/28/12 1705 07/29/12 0520 08/02/12 1035 08/02/12 1952 08/03/12 0300  TROPONINI <0.30 <0.30 <0.30 <0.30 <0.30    Recent Labs Lab 07/28/12 1730 07/28/12 2042 07/28/12 2355 07/30/12 1023 08/01/12 1649  PHART 7.149* 7.143* 7.199* 7.272* 7.382  PCO2ART 83.0* 86.9* 69.8* 52.9* 53.0*  PO2ART 81.8 132.0* 83.8 59.0* 77.1*  HCO3 27.7* 29.8* 26.1* 24.6* 30.8*  TCO2 30.2 32 28.3 26 32.4  O2SAT 95.0 98.0 97.1 87.0 96.7  IMAGING x 48h Dg Chest Port 1 View  08/03/2012   *RADIOLOGY REPORT*  Clinical Data: Respiratory failure, pulmonary infiltrates  PORTABLE CHEST - 1 VIEW  Comparison: Prior chest x-ray 07/28/2012  Findings: No support apparatus identified.  Improving interstitial opacities in the bilateral lung bases.  Stable mild cardiomegaly. No pneumothorax or large effusion.  No acute osseous abnormality.  IMPRESSION:  Improving/resolving interstitial opacities the bilateral lung bases consistent with resolving edema or pneumonia.   Original Report Authenticated By: Malachy Moan, M.D.    ASSESSMENT: Acute on Chronic Resp Failure Acute on Chronic renal insuff versus Worsened CRI (baseline creat 1.08mg % last checked Augst 2012) Acute exacerbation of O2 dependent COPD Ongoing tobacco abuse despite counseling.  CAD  Chronic AFib   Acute on Chronic diastolic CHF   PLAN #RESP - nocturnal CPAP - BDs  > pulmicort, atrovent, xopenex - oral  pred; cut down to 30mg  per day on 08/03/12 - po levaquin - ends 08/03/12  #A Fib and diast dysfuntion - lasix change to PO 60 bid - saline lock - cardizem gtt weaned to off - home cardiac meds > multaq, bystolic, imdur, dilt PO  #General - Oral diet - Saline lock IV - SSI coverage while on systemic steroids - PT consult   Keep in  SDU until stable for several hours off dilt gtt  08/03/12: Wife and patient updated    Levy Pupa, MD, PhD 08/03/2012, 1:56 PM [place] [facility] [phone] or if no answer [phone]

## 2012-08-03 NOTE — Progress Notes (Signed)
Patient refused to wear cpap tonight. Patient says he wants to just wear his 02 tonight.

## 2012-08-03 NOTE — Progress Notes (Signed)
Patient changed his mind about wearing cpap and placed it on himself. Rt will continue to monitor.

## 2012-08-04 LAB — GLUCOSE, CAPILLARY: Glucose-Capillary: 125 mg/dL — ABNORMAL HIGH (ref 70–99)

## 2012-08-04 LAB — BASIC METABOLIC PANEL
CO2: 38 mEq/L — ABNORMAL HIGH (ref 19–32)
Chloride: 95 mEq/L — ABNORMAL LOW (ref 96–112)
Sodium: 142 mEq/L (ref 135–145)

## 2012-08-04 LAB — MAGNESIUM: Magnesium: 1.7 mg/dL (ref 1.5–2.5)

## 2012-08-04 MED ORDER — LEVALBUTEROL HCL 0.63 MG/3ML IN NEBU
0.6300 mg | INHALATION_SOLUTION | Freq: Two times a day (BID) | RESPIRATORY_TRACT | Status: DC
  Administered 2012-08-05 – 2012-08-06 (×3): 0.63 mg via RESPIRATORY_TRACT
  Filled 2012-08-04 (×6): qty 3

## 2012-08-04 MED ORDER — IPRATROPIUM BROMIDE 0.02 % IN SOLN
0.5000 mg | Freq: Two times a day (BID) | RESPIRATORY_TRACT | Status: DC
  Administered 2012-08-05 – 2012-08-06 (×3): 0.5 mg via RESPIRATORY_TRACT
  Filled 2012-08-04 (×4): qty 2.5

## 2012-08-04 MED ORDER — DILTIAZEM HCL ER COATED BEADS 240 MG PO CP24
240.0000 mg | ORAL_CAPSULE | Freq: Once | ORAL | Status: AC
  Administered 2012-08-04: 240 mg via ORAL
  Filled 2012-08-04: qty 1

## 2012-08-04 MED ORDER — DILTIAZEM HCL ER 90 MG PO CP12
180.0000 mg | ORAL_CAPSULE | Freq: Two times a day (BID) | ORAL | Status: DC
  Administered 2012-08-04 – 2012-08-08 (×9): 180 mg via ORAL
  Filled 2012-08-04 (×11): qty 2

## 2012-08-04 MED ORDER — TAMSULOSIN HCL 0.4 MG PO CAPS
0.4000 mg | ORAL_CAPSULE | Freq: Every day | ORAL | Status: DC
  Administered 2012-08-04 – 2012-08-08 (×5): 0.4 mg via ORAL
  Filled 2012-08-04 (×5): qty 1

## 2012-08-04 MED ORDER — PANTOPRAZOLE SODIUM 40 MG PO TBEC
40.0000 mg | DELAYED_RELEASE_TABLET | Freq: Two times a day (BID) | ORAL | Status: DC
  Administered 2012-08-04 – 2012-08-08 (×8): 40 mg via ORAL
  Filled 2012-08-04 (×8): qty 1

## 2012-08-04 MED ORDER — FINASTERIDE 5 MG PO TABS
5.0000 mg | ORAL_TABLET | Freq: Every day | ORAL | Status: DC
  Administered 2012-08-04 – 2012-08-08 (×5): 5 mg via ORAL
  Filled 2012-08-04 (×5): qty 1

## 2012-08-04 NOTE — Progress Notes (Signed)
PULMONARY  / CRITICAL CARE MEDICINE  Name: [name] MRN: [id number] DOB: [birth year] PCP Londell Moh, MD     ADMISSION DATE:  07/28/2012 CONSULTATION DATE:  07/28/12  REFERRING MD :  Fonnie Jarvis PRIMARY SERVICE:  PCCM  CHIEF COMPLAINT:  Short of Breath  BRIEF PATIENT DESCRIPTION: 89 yoM smoker with COPD NOS followed by Dr Shelle Iron, hx CAD, AFib, OSA on nocturnal CPAP with 2L o2 at night; adm via ED with 3 days of increasing dyspnea he blames on smoking too much. Placed on BIPAP in ED. PCCM called to admit.  SIGNIFICANT EVENTS / STUDIES:        ANTIBIOTICS: Rocephin 6/28 >> 7/1 levaquin 7/1 > 7/4     SUBJECTIVE/OVERNIGHT/INTERVAL HX  new pain on urination/ freq after foley out   VITAL SIGNS: Temp:  [97.5 F (36.4 C)-98.6 F (37 C)] 98.6 F (37 C) (07/05 0700) Pulse Rate:  [104-126] 108 (07/05 0429) Resp:  [14-32] 18 (07/05 0429) BP: (103-120)/(62-80) 114/80 mmHg (07/05 0429) SpO2:  [92 %-99 %] 98 % (07/05 0429) FiO2 (%):  [30 %] 30 % (07/05 0113) Weight:  [189 lb (85.73 kg)] 189 lb (85.73 kg) (07/05 0500)  Intake/Output Summary (Last 24 hours) at 08/04/12 0839 Last data filed at 08/04/12 0700  Gross per 24 hour  Intake    870 ml  Output   4000 ml  Net  -3130 ml   PHYSICAL EXAMINATION: General:  NAD Neuro: Fully oriented, moving all ext, PERRLA HEENT:  WNL Neck: No JVD Cardiovascular:  IRIR, no M noted Lungs:  Min B exp wheezes, diminished BS throughout .  Abdomen:  Soft, nontender, BS+ Ext: 1+ LE edema    Recent Labs Lab 08/01/12 0500 08/02/12 0545 08/04/12 0535  NA 139 141 142  K 5.4* 4.6 4.3  CL 100 98 95*  CO2 32 33* 38*  BUN 56* 53* 58*  CREATININE 1.82* 1.88* 2.18*  GLUCOSE 163* 103* 89    Recent Labs Lab 07/29/12 0520 07/31/12 0535 08/01/12 0500 08/02/12 0545 08/03/12 0300 08/04/12 0535  NA 139 137 139 141  --  142  K 5.1 4.9 5.4* 4.6  --  4.3  CL 105 102 100 98  --  95*  CO2 27 28 32 33*  --  38*  GLUCOSE 148* 140*  163* 103*  --  89  BUN 42* 55* 56* 53*  --  58*  CREATININE 1.89* 1.90* 1.82* 1.88*  --  2.18*  CALCIUM 8.5 8.8 8.9 9.5  --  9.4  MG  --   --   --  1.7 1.7 1.7  PHOS  --   --   --   --  3.7  --      Recent Labs Lab 07/28/12 1244 07/29/12 0520 07/31/12 0535  HGB 12.7* 12.1* 11.0*  HCT 38.8* 38.4* 34.3*  WBC 11.0* 9.5 10.5  PLT 124* 121* 137*    Recent Labs Lab 07/28/12 1251 07/31/12 0535 08/01/12 0500 08/03/12 0300  PROBNP 5624.0* 3699.0* 4309.0* 4088.0*      Recent Labs Lab 07/28/12 1705 07/29/12 0520 08/02/12 1035 08/02/12 1952 08/03/12 0300  TROPONINI <0.30 <0.30 <0.30 <0.30 <0.30    Recent Labs Lab 07/28/12 1730 07/28/12 2042 07/28/12 2355 07/30/12 1023 08/01/12 1649  PHART 7.149* 7.143* 7.199* 7.272* 7.382  PCO2ART 83.0* 86.9* 69.8* 52.9* 53.0*  PO2ART 81.8 132.0* 83.8 59.0* 77.1*  HCO3 27.7* 29.8* 26.1* 24.6* 30.8*  TCO2 30.2 32 28.3 26 32.4  O2SAT 95.0 98.0 97.1 87.0 96.7  IMAGING x 48h Dg Chest Port 1 View  08/03/2012   *RADIOLOGY REPORT*  Clinical Data: Respiratory failure, pulmonary infiltrates  PORTABLE CHEST - 1 VIEW  Comparison: Prior chest x-ray 07/28/2012  Findings: No support apparatus identified.  Improving interstitial opacities in the bilateral lung bases.  Stable mild cardiomegaly. No pneumothorax or large effusion.  No acute osseous abnormality.  IMPRESSION:  Improving/resolving interstitial opacities the bilateral lung bases consistent with resolving edema or pneumonia.   Original Report Authenticated By: Malachy Moan, M.D.    ASSESSMENT: Acute on Chronic Resp Failure with hypercarbia Acute on Chronic renal insuff versus Worsened CRI (baseline creat 1.08mg % last checked Augst 2012) Acute exacerbation of O2 dependent COPD Ongoing tobacco abuse despite counseling.  CAD  Chronic AFib   Acute on Chronic diastolic CHF Dysuria/ frequency ? Foley related  In pt with CRI > nephrology eval requested 7/5   PLAN #RESP - nocturnal  CPAP as per home settings as of 7/5 - BDs  > pulmicort, atrovent, xopenex - oral pred;   30mg  per day as of 08/03/12 - po levaquin - ended 08/03/12  #A Fib and diast dysfuntion - lasix changed to PO 60 bid - saline lock - cardizem gtt weaned to off 7/4 - home cardiac meds > multaq, bystolic, imdur, dilt PO  #General - Oral diet - Saline lock IV - SSI coverage while on systemic steroids - PT consult    08/04/12: Wife and patient updated    Sandrea Hughs, MD Pulmonary and Critical Care Medicine [facility] Cell [phone] After 5:30 PM or weekends, call [phone]

## 2012-08-04 NOTE — Progress Notes (Signed)
Patient has home cpap now and has placed himself on cpap. Patient is tolerating cpap well at this time with 4lpm 02 bleed in. RT will continue to monitor.

## 2012-08-04 NOTE — Consult Note (Signed)
Reason for Consult: Urinary Retention  Referring Physician: Sherene Sires MD  [name] is an 72 y.o. male.   HPI:   1 - Urinary Retention / Lower Urinary Tract Symptoms - pt with long h/o of mild-moderate obstructive LUTS with weak stream and start-stop flow as well as prior UTI x 1, follows with [facility]t as outpatient. Now admitted with CHF/COPD exacerbation and noted to have bothersome urinary urgency / frequency as well as retention. PVR today 375cc after 200cc void. DRE 60gm smooth. Takes no GU meds at baseline.  2 - Chronic Renal Insufficiency - Pt with baseline Cr 1.75-2 range for several years. Prior renal imaging w/o stones or hydro and likely medical renal disease. Pt noted to have modest rise in house to 2.2 up from 1.8, he is negative 7 L fluid past 2 days.   Today [name] is seen in consultation for above. He is most bothered by urinary frequency Q19minutes.    Past Medical History  Diagnosis Date  . COPD (chronic obstructive pulmonary disease)   . Active smoker   . Sleep apnea     managed by Dr. Renne Crigler  . Obesity   . Coronary artery disease   . Benign neoplasm of colon   . Atrial fibrillation   . Pyelonephritis, unspecified   . Other and unspecified hyperlipidemia   . Personal history of peptic ulcer disease   . Elevated prostate specific antigen (PSA)   . Diaphragmatic hernia without mention of obstruction or gangrene   . Esophageal reflux   . Tobacco use disorder   . Unspecified essential hypertension   . Peripheral vascular disease     Past Surgical History  Procedure Laterality Date  . Coronary artery stent  2003  . Cardioversion N/A 06/07/2012    Procedure: CARDIOVERSION;  Surgeon: Lennette Bihari, MD;  Location: [facility] ENDOSCOPY;  Service: Cardiovascular;  Laterality: N/A;    Family History  Problem Relation Age of Onset  . Heart disease Mother   . Coronary artery disease Father   . Emphysema Father     Social History:  reports that he has been smoking  Cigarettes.  He has a 55 pack-year smoking history. He has never used smokeless tobacco. He reports that he drinks about 1.8 ounces of alcohol per week. He reports that he does not use illicit drugs.  Allergies:  Allergies  Allergen Reactions  . Ranitidine Hcl     REACTION: throat swelling, difficulty breathing  . Beta Adrenergic Blockers     REACTION: sexual side effects  . Ramipril     REACTION: throat swelling  . Sulfonamide Derivatives     REACTION: childhood reation of hematuria  . Ticlopidine Hcl     REACTION: swelling    Medications: I have reviewed the patient's current medications.  Results for orders placed during the hospital encounter of 07/28/12 (from the past 48 hour(s))  GLUCOSE, CAPILLARY     Status: Abnormal   Collection Time    08/02/12  4:31 PM      Result Value Range   Glucose-Capillary 135 (*) 70 - 99 mg/dL   Comment 1 Notify RN     Comment 2 Documented in Chart    TROPONIN I     Status: None   Collection Time    08/02/12  7:52 PM      Result Value Range   Troponin I <0.30  <0.30 ng/mL   Comment:            Due to  the release kinetics of cTnI,     a negative result within the first hours     of the onset of symptoms does not rule out     myocardial infarction with certainty.     If myocardial infarction is still suspected,     repeat the test at appropriate intervals.  GLUCOSE, CAPILLARY     Status: Abnormal   Collection Time    08/02/12  9:21 PM      Result Value Range   Glucose-Capillary 194 (*) 70 - 99 mg/dL   Comment 1 Notify RN     Comment 2 Documented in Chart    TROPONIN I     Status: None   Collection Time    08/03/12  3:00 AM      Result Value Range   Troponin I <0.30  <0.30 ng/mL   Comment:            Due to the release kinetics of cTnI,     a negative result within the first hours     of the onset of symptoms does not rule out     myocardial infarction with certainty.     If myocardial infarction is still suspected,     repeat  the test at appropriate intervals.  MAGNESIUM     Status: None   Collection Time    08/03/12  3:00 AM      Result Value Range   Magnesium 1.7  1.5 - 2.5 mg/dL  PHOSPHORUS     Status: None   Collection Time    08/03/12  3:00 AM      Result Value Range   Phosphorus 3.7  2.3 - 4.6 mg/dL  PRO B NATRIURETIC PEPTIDE     Status: Abnormal   Collection Time    08/03/12  3:00 AM      Result Value Range   Pro B Natriuretic peptide (BNP) 4088.0 (*) 0 - 125 pg/mL  GLUCOSE, CAPILLARY     Status: None   Collection Time    08/03/12  8:03 AM      Result Value Range   Glucose-Capillary 85  70 - 99 mg/dL   Comment 1 Documented in Chart     Comment 2 Notify RN    GLUCOSE, CAPILLARY     Status: Abnormal   Collection Time    08/03/12  1:01 PM      Result Value Range   Glucose-Capillary 108 (*) 70 - 99 mg/dL   Comment 1 Documented in Chart     Comment 2 Notify RN    GLUCOSE, CAPILLARY     Status: Abnormal   Collection Time    08/03/12  5:45 PM      Result Value Range   Glucose-Capillary 135 (*) 70 - 99 mg/dL   Comment 1 Documented in Chart     Comment 2 Notify RN    GLUCOSE, CAPILLARY     Status: Abnormal   Collection Time    08/03/12  9:36 PM      Result Value Range   Glucose-Capillary 147 (*) 70 - 99 mg/dL   Comment 1 Notify RN     Comment 2 Documented in Chart    BASIC METABOLIC PANEL     Status: Abnormal   Collection Time    08/04/12  5:35 AM      Result Value Range   Sodium 142  135 - 145 mEq/L   Potassium 4.3  3.5 - 5.1 mEq/L  Chloride 95 (*) 96 - 112 mEq/L   CO2 38 (*) 19 - 32 mEq/L   Glucose, Bld 89  70 - 99 mg/dL   BUN 58 (*) 6 - 23 mg/dL   Creatinine, Ser 2.84 (*) 0.50 - 1.35 mg/dL   Calcium 9.4  8.4 - 13.2 mg/dL   GFR calc non Af Amer 29 (*) >90 mL/min   GFR calc Af Amer 33 (*) >90 mL/min   Comment:            The eGFR has been calculated     using the CKD EPI equation.     This calculation has not been     validated in all clinical     situations.     eGFR's  persistently     <90 mL/min signify     possible Chronic Kidney Disease.  MAGNESIUM     Status: None   Collection Time    08/04/12  5:35 AM      Result Value Range   Magnesium 1.7  1.5 - 2.5 mg/dL  GLUCOSE, CAPILLARY     Status: None   Collection Time    08/04/12  7:50 AM      Result Value Range   Glucose-Capillary 83  70 - 99 mg/dL   Comment 1 Documented in Chart     Comment 2 Notify RN    GLUCOSE, CAPILLARY     Status: Abnormal   Collection Time    08/04/12 11:45 AM      Result Value Range   Glucose-Capillary 109 (*) 70 - 99 mg/dL   Comment 1 Documented in Chart     Comment 2 Notify RN      Dg Chest Port 1 View  08/03/2012   *RADIOLOGY REPORT*  Clinical Data: Respiratory failure, pulmonary infiltrates  PORTABLE CHEST - 1 VIEW  Comparison: Prior chest x-ray 07/28/2012  Findings: No support apparatus identified.  Improving interstitial opacities in the bilateral lung bases.  Stable mild cardiomegaly. No pneumothorax or large effusion.  No acute osseous abnormality.  IMPRESSION:  Improving/resolving interstitial opacities the bilateral lung bases consistent with resolving edema or pneumonia.   Original Report Authenticated By: Malachy Moan, M.D.    Review of Systems  Constitutional: Negative.   HENT: Negative.   Eyes: Negative.   Respiratory: Positive for shortness of breath and wheezing.   Cardiovascular: Negative.   Gastrointestinal: Negative.   Genitourinary: Positive for dysuria, urgency and frequency. Negative for hematuria and flank pain.  Skin: Negative.   Neurological: Negative.   Endo/Heme/Allergies: Negative.   Psychiatric/Behavioral: Negative.    Blood pressure 112/67, pulse 102, temperature 97.5 F (36.4 C), temperature source Oral, resp. rate 17, height 5\' 11"  (1.803 m), weight 85.73 kg (189 lb), SpO2 99.00%. Physical Exam  Constitutional: He is oriented to person, place, and time. He appears well-developed and well-nourished.  Visibly short of breath, wife  at bedside  HENT:  Head: Normocephalic and atraumatic.  Eyes: EOM are normal. Pupils are equal, round, and reactive to light.  Neck: Normal range of motion. Neck supple.  Cardiovascular: Normal rate.   Respiratory: He has wheezes.  Pursed lip breathing  GI: Soft.  Genitourinary: Prostate normal and penis normal.  60gm smooth prostate  Musculoskeletal: Normal range of motion.  Neurological: He is alert and oriented to person, place, and time.  Skin: Skin is warm and dry.  Psychiatric: He has a normal mood and affect. His behavior is normal. Judgment and thought content normal.  Assessment/Plan:  1 - Urinary Retention / Lower Urinary Tract Symptoms - Likely acute exacerbation of baseline BPH. As he is receiving continued diuresis / close UOP monitoring and he is very bothored by urinary frequency, foley safest option for now. 14F coude placed using aseptic technique with immediate efflux 500cc clear urine. Will begin tamsulosin + finasteride daily in house, to be continued as outpatient. He may have trial of void in 3-4 days if still inpatient, or in our office electively after discharge.   2 - Chronic Renal Insufficiency - Likely multifactorial but chiefly from underlying medical renal disease with mild exacerbation from pre-renal diuresis and perhaps very modest component of post-obstruction. I do not feel that his relatively modest retention if the chief cause of this given his prior imaging w/o hydro.  3 - Call with questions.   Marisabel Macpherson 08/04/2012, 12:16 PM

## 2012-08-05 LAB — GLUCOSE, CAPILLARY
Glucose-Capillary: 153 mg/dL — ABNORMAL HIGH (ref 70–99)
Glucose-Capillary: 173 mg/dL — ABNORMAL HIGH (ref 70–99)

## 2012-08-05 MED ORDER — FENTANYL CITRATE 0.05 MG/ML IJ SOLN
25.0000 ug | Freq: Once | INTRAMUSCULAR | Status: AC
  Administered 2012-08-05: 25 ug via INTRAVENOUS

## 2012-08-05 MED ORDER — FENTANYL CITRATE 0.05 MG/ML IJ SOLN
INTRAMUSCULAR | Status: AC
  Filled 2012-08-05: qty 2

## 2012-08-05 NOTE — Progress Notes (Signed)
RT in room to administer nebulizer tx. Pt. Is wearing his home CPAP. Pt. SPO2 is 86%.  Post tx. Bled in 2 lpm O2 for nocturnal use per home regimen.

## 2012-08-05 NOTE — Progress Notes (Signed)
eLink Physician-Brief Progress Note Patient Name: [name] DOB: Mar 01, 1940 MRN: [id number]  Date of Service  08/05/2012   HPI/Events of Note  Recently inserted foley catheter - now with unrelenting pain not relieved with tylenol.  HD stable with adeqate sats on CPAP   eICU Interventions  Plan: One time dose of fentanyl 25 mcg IV   Intervention Category Intermediate Interventions: Pain - evaluation and management  Rolande Moe 08/05/2012, 2:18 AM

## 2012-08-05 NOTE — Progress Notes (Signed)
Nursing Pt c/o pain and urge to urinate.  20 Fr coude cath intact.  30 cc in drainage bag.  Pt bladder scanned to show 656 cc urine.  Dr. Annabell Howells notified and orders to irriage foley received.  Catheter irrigated with 60 cc NS to receive 750 cc of blood tinged urine with many clots.  Will continue to monitor pt and irrigate as needed.

## 2012-08-05 NOTE — Progress Notes (Signed)
PULMONARY  / CRITICAL CARE MEDICINE  Name: [name] MRN: [id number] DOB: 1940/06/25 PCP Londell Moh, MD     ADMISSION DATE:  07/28/2012 CONSULTATION DATE:  07/28/12  REFERRING MD :  Fonnie Jarvis PRIMARY SERVICE:  PCCM  CHIEF COMPLAINT:  Short of Breath  BRIEF PATIENT DESCRIPTION: 52 yoM smoker with COPD NOS followed by Dr Shelle Iron, hx CAD, AFib, OSA on nocturnal CPAP with 2L o2 at night; adm via ED with 3 days of increasing dyspnea he blames on smoking too much. Placed on BIPAP in ED. PCCM called to admit.  SIGNIFICANT EVENTS / STUDIES:  Urology consult 7/5 > foley replaced and should stay in until outpt fu/       ANTIBIOTICS: Rocephin 6/28 >> 7/1 levaquin 7/1 > 7/4     SUBJECTIVE/OVERNIGHT/INTERVAL HX Better p foleyt   VITAL SIGNS: Temp:  [97.1 F (36.2 C)-98.8 F (37.1 C)] 98.6 F (37 C) (07/06 1200) Pulse Rate:  [65-111] 84 (07/06 1200) Resp:  [18-31] 31 (07/06 1200) BP: (94-112)/(53-72) 107/60 mmHg (07/06 1200) SpO2:  [88 %-99 %] 98 % (07/06 1200) FiO2 (%):  [36 %] 36 % (07/05 1959) Weight:  [192 lb 7.4 oz (87.3 kg)] 192 lb 7.4 oz (87.3 kg) (07/06 0344)  Intake/Output Summary (Last 24 hours) at 08/05/12 1237 Last data filed at 08/05/12 1200  Gross per 24 hour  Intake    543 ml  Output   3276 ml  Net  -2733 ml   PHYSICAL EXAMINATION: General:  NAD Neuro: Fully oriented, moving all ext, PERRLA HEENT:  WNL Neck: No JVD Cardiovascular:  IRIR, no M noted Lungs:  Min B exp wheezes, diminished BS throughout .  Abdomen:  Soft, nontender, BS+ Ext: 1+ LE edema    Recent Labs Lab 08/01/12 0500 08/02/12 0545 08/04/12 0535  NA 139 141 142  K 5.4* 4.6 4.3  CL 100 98 95*  CO2 32 33* 38*  BUN 56* 53* 58*  CREATININE 1.82* 1.88* 2.18*  GLUCOSE 163* 103* 89    Recent Labs Lab 07/31/12 0535 08/01/12 0500 08/02/12 0545 08/03/12 0300 08/04/12 0535  NA 137 139 141  --  142  K 4.9 5.4* 4.6  --  4.3  CL 102 100 98  --  95*  CO2 28 32 33*  --   38*  GLUCOSE 140* 163* 103*  --  89  BUN 55* 56* 53*  --  58*  CREATININE 1.90* 1.82* 1.88*  --  2.18*  CALCIUM 8.8 8.9 9.5  --  9.4  MG  --   --  1.7 1.7 1.7  PHOS  --   --   --  3.7  --      Recent Labs Lab 07/31/12 0535  HGB 11.0*  HCT 34.3*  WBC 10.5  PLT 137*    Recent Labs Lab 07/31/12 0535 08/01/12 0500 08/03/12 0300  PROBNP 3699.0* 4309.0* 4088.0*      Recent Labs Lab 08/02/12 1035 08/02/12 1952 08/03/12 0300  TROPONINI <0.30 <0.30 <0.30    Recent Labs Lab 07/30/12 1023 08/01/12 1649  PHART 7.272* 7.382  PCO2ART 52.9* 53.0*  PO2ART 59.0* 77.1*  HCO3 24.6* 30.8*  TCO2 26 32.4  O2SAT 87.0 96.7    IMAGING x 48h No results found.  ASSESSMENT: Acute on Chronic Resp Failure with hypercarbia Acute on Chronic renal insuff versus Worsened CRI (baseline creat 1.08  last checked Augst 2012) Acute exacerbation of O2 dependent COPD Ongoing tobacco abuse despite counseling.  CAD  Chronic AFib  Acute on Chronic diastolic CHF Dysuria/ frequency ? Foley related  In pt with CRI > urology eva  7/5   PLAN #RESP - nocturnal CPAP as per home settings as of 7/5 - BDs  > pulmicort, atrovent, xopenex - oral pred;   30mg  per day as of 08/03/12 - po levaquin - ended 08/03/12  #A Fib and diast dysfuntion - lasix changed to PO 60 bid - saline lock   - home cardiac meds > multaq, bystolic, imdur, dilt PO> rate controlled off cardizem drip since 7/4   #General - Oral diet - Saline lock IV - SSI coverage while on systemic steroids - PT consult    08/05/12: Wife and patient updated    Sandrea Hughs, MD Pulmonary and Critical Care Medicine [facility] Cell [phone] After 5:30 PM or weekends, call [phone]

## 2012-08-05 NOTE — Progress Notes (Signed)
Patient ID: [name], male   DOB: 11-09-1940, 72 y.o.   MRN: [id number]    Subjective: [name] had some issues with catheter obstruction from old clots in the night but the tube is draining well post irrigation and the urine looks otherwise clear.  ROS: No urologic complaints except as above.   No abdominal pain.   Objective: Vital signs in last 24 hours: Temp:  [97.1 F (36.2 C)-98.8 F (37.1 C)] 98.8 F (37.1 C) (07/06 0717) Pulse Rate:  [65-111] 111 (07/06 0344) Resp:  [18-29] 29 (07/06 0344) BP: (94-112)/(53-76) 110/69 mmHg (07/06 0344) SpO2:  [88 %-100 %] 99 % (07/06 0717) FiO2 (%):  [36 %] 36 % (07/05 1959) Weight:  [87.3 kg (192 lb 7.4 oz)] 87.3 kg (192 lb 7.4 oz) (07/06 0344)  Intake/Output from previous day: 07/05 0701 - 07/06 0700 In: 660 [P.O.:600] Out: 2870 [Urine:2870] Intake/Output this shift: Total I/O In: 60 [Other:60] Out: 425 [Urine:425]  General appearance: alert and no distress Male genitalia: normal, The foley is draining clear urine but there are some old gray clots in the tubing.  Lab Results:  No results found for this basename: WBC, HGB, HCT, PLT,  in the last 72 hours BMET  Recent Labs  08/04/12 0535  NA 142  K 4.3  CL 95*  CO2 38*  GLUCOSE 89  BUN 58*  CREATININE 2.18*  CALCIUM 9.4   PT/INR No results found for this basename: LABPROT, INR,  in the last 72 hours ABG No results found for this basename: PHART, PCO2, PO2, HCO3,  in the last 72 hours  Studies/Results: No results found.  Anti-infectives: Anti-infectives   Start     Dose/Rate Route Frequency Ordered Stop   07/31/12 1100  levofloxacin (LEVAQUIN) tablet 500 mg     500 mg Oral Daily 07/31/12 1023 08/03/12 0959   07/29/12 0000  azithromycin (ZITHROMAX) 250 mg in dextrose 5 % 125 mL IVPB  Status:  Discontinued     250 mg 125 mL/hr over 60 Minutes Intravenous Every 24 hours 07/28/12 1212 07/29/12 0914   07/28/12 1215  cefTRIAXone (ROCEPHIN) 1 g in dextrose 5 % 50  mL IVPB  Status:  Discontinued     1 g 100 mL/hr over 30 Minutes Intravenous Every 24 hours 07/28/12 1212 07/31/12 1023   07/28/12 1000  cefTRIAXone (ROCEPHIN) 1 g in dextrose 5 % 50 mL IVPB     1 g 100 mL/hr over 30 Minutes Intravenous  Once 07/28/12 0950 07/28/12 1051   07/28/12 1000  azithromycin (ZITHROMAX) 500 mg in dextrose 5 % 250 mL IVPB     500 mg 250 mL/hr over 60 Minutes Intravenous  Once 07/28/12 0950 07/28/12 1223      Current Facility-Administered Medications  Medication Dose Route Frequency Provider Last Rate Last Dose  . acetaminophen (TYLENOL) tablet 650 mg  650 mg Oral Q6H PRN Waymon Budge, MD   650 mg at 08/05/12 0156  . apixaban (ELIQUIS) tablet 5 mg  5 mg Oral BID Waymon Budge, MD   5 mg at 08/04/12 2152  . aspirin EC tablet 81 mg  81 mg Oral Daily Waymon Budge, MD   81 mg at 08/04/12 0908  . atorvastatin (LIPITOR) tablet 10 mg  10 mg Oral q1800 Waymon Budge, MD   10 mg at 08/04/12 1716  . budesonide (PULMICORT) nebulizer solution 0.25 mg  0.25 mg Nebulization BID Coralyn Helling, MD   0.25 mg at 08/05/12 0717  .  cholecalciferol (VITAMIN D) tablet 2,000 Units  2,000 Units Oral Daily Waymon Budge, MD   2,000 Units at 08/04/12 0908  . diltiazem (CARDIZEM SR) 12 hr capsule 180 mg  180 mg Oral Q12H Nyoka Cowden, MD   180 mg at 08/04/12 2153  . dronedarone (MULTAQ) tablet 400 mg  400 mg Oral BID WC Waymon Budge, MD   400 mg at 08/05/12 0834  . fentaNYL (SUBLIMAZE) 0.05 MG/ML injection           . finasteride (PROSCAR) tablet 5 mg  5 mg Oral Daily Sebastian Ache, MD   5 mg at 08/04/12 1338  . furosemide (LASIX) tablet 60 mg  60 mg Oral BID Leslye Peer, MD   60 mg at 08/05/12 2130  . insulin aspart (novoLOG) injection 0-15 Units  0-15 Units Subcutaneous TID WC Merwyn Katos, MD   2 Units at 08/05/12 [phone]  . insulin aspart (novoLOG) injection 0-5 Units  0-5 Units Subcutaneous QHS Merwyn Katos, MD      . ipratropium (ATROVENT) nebulizer solution 0.5 mg   0.5 mg Nebulization BID Waymon Budge, MD   0.5 mg at 08/05/12 0717  . isosorbide mononitrate (IMDUR) 24 hr tablet 15 mg  15 mg Oral Daily Waymon Budge, MD   15 mg at 08/04/12 0908  . levalbuterol (XOPENEX) nebulizer solution 0.63 mg  0.63 mg Nebulization Q3H PRN Merwyn Katos, MD      . levalbuterol [facility]) nebulizer solution 0.63 mg  0.63 mg Nebulization BID Waymon Budge, MD   0.63 mg at 08/05/12 8469  . multivitamin with minerals tablet 1 tablet  1 tablet Oral Daily Waymon Budge, MD   1 tablet at 08/04/12 0908  . nebivolol (BYSTOLIC) tablet 10 mg  10 mg Oral Daily Waymon Budge, MD   10 mg at 08/04/12 0907  . nicotine (NICODERM CQ - dosed in mg/24 hours) patch 21 mg  21 mg Transdermal Daily Lonia Farber, MD   21 mg at 08/04/12 0908  . pantoprazole (PROTONIX) EC tablet 40 mg  40 mg Oral BID AC Nyoka Cowden, MD   40 mg at 08/05/12 0834  . potassium chloride SA (K-DUR,KLOR-CON) CR tablet 20 mEq  20 mEq Oral BID Kalman Shan, MD   20 mEq at 08/04/12 2152  . predniSONE (DELTASONE) tablet 30 mg  30 mg Oral Q breakfast Leslye Peer, MD   30 mg at 08/05/12 0834  . sodium chloride 0.9 % injection 3 mL  3 mL Intravenous Q12H Waymon Budge, MD   3 mL at 08/04/12 2153  . tamsulosin (FLOMAX) capsule 0.4 mg  0.4 mg Oral Daily Sebastian Ache, MD   0.4 mg at 08/04/12 1338    Assessment: BPH with BOO with retention and catheter obstruction from old clots.    Plan: Irrigate foley prn.   Possible voiding trial in 3-4 days if still in hospital. Continue current meds.       LOS: 8 days    [name] J 08/05/2012

## 2012-08-06 LAB — BASIC METABOLIC PANEL
BUN: 68 mg/dL — ABNORMAL HIGH (ref 6–23)
Calcium: 8.7 mg/dL (ref 8.4–10.5)
Creatinine, Ser: 2.26 mg/dL — ABNORMAL HIGH (ref 0.50–1.35)
GFR calc Af Amer: 32 mL/min — ABNORMAL LOW (ref >90)
GFR calc non Af Amer: 27 mL/min — ABNORMAL LOW (ref >90)
Glucose, Bld: 103 mg/dL — ABNORMAL HIGH (ref 70–99)
Potassium: 4.2 mEq/L (ref 3.5–5.1)

## 2012-08-06 LAB — GLUCOSE, CAPILLARY
Glucose-Capillary: 130 mg/dL — ABNORMAL HIGH (ref 70–99)
Glucose-Capillary: 219 mg/dL — ABNORMAL HIGH (ref 70–99)

## 2012-08-06 LAB — CBC
MCH: 30.1 pg (ref 26.0–34.0)
MCHC: 33.3 g/dL (ref 30.0–36.0)
RDW: 13.7 % (ref 11.5–15.5)

## 2012-08-06 MED ORDER — LEVALBUTEROL HCL 0.63 MG/3ML IN NEBU
0.6300 mg | INHALATION_SOLUTION | Freq: Four times a day (QID) | RESPIRATORY_TRACT | Status: DC
  Administered 2012-08-06 – 2012-08-08 (×8): 0.63 mg via RESPIRATORY_TRACT
  Filled 2012-08-06 (×16): qty 3

## 2012-08-06 MED ORDER — PREDNISONE 20 MG PO TABS
20.0000 mg | ORAL_TABLET | Freq: Every day | ORAL | Status: DC
  Administered 2012-08-07 – 2012-08-08 (×2): 20 mg via ORAL
  Filled 2012-08-06 (×3): qty 1

## 2012-08-06 NOTE — Progress Notes (Signed)
Utilization review completed.  

## 2012-08-06 NOTE — Progress Notes (Signed)
Received pt from 3300. Pt is stable and resting in bed with wife at bed side

## 2012-08-06 NOTE — Progress Notes (Signed)
PULMONARY  / CRITICAL CARE MEDICINE  Name: [name] MRN: [id number] DOB: September 04, 1940 PCP Londell Moh, MD     ADMISSION DATE:  07/28/2012 CONSULTATION DATE:  07/28/12  REFERRING MD :  Fonnie Jarvis PRIMARY SERVICE:  PCCM  CHIEF COMPLAINT:  Short of Breath  BRIEF PATIENT DESCRIPTION: 53 yoM smoker with COPD NOS followed by Dr Shelle Iron, hx CAD, AFib, OSA on nocturnal CPAP with 2L o2 at night; adm via ED with 3 days of increasing dyspnea he blames on smoking too much. Placed on BIPAP in ED. PCCM called to admit.  SIGNIFICANT EVENTS / STUDIES:  Urology consult 7/5 > foley replaced and should stay in until outpt fu/    ANTIBIOTICS: Rocephin 6/28 >> 7/1 levaquin 7/1 > 7/4     SUBJECTIVE/OVERNIGHT/INTERVAL HX Pt improved. Ready for tfr to floor.     VITAL SIGNS: Temp:  [98 F (36.7 C)-98.6 F (37 C)] 98.4 F (36.9 C) (07/07 0813) Pulse Rate:  [84-132] 132 (07/07 1021) Resp:  [17-31] 17 (07/07 0342) BP: (101-127)/(59-74) 107/68 mmHg (07/07 0813) SpO2:  [86 %-98 %] 91 % (07/07 1021) Weight:  [85.9 kg (189 lb 6 oz)] 85.9 kg (189 lb 6 oz) (07/07 0342)  Intake/Output Summary (Last 24 hours) at 08/06/12 1116 Last data filed at 08/06/12 0814  Gross per 24 hour  Intake     90 ml  Output   2727 ml  Net  -2637 ml   PHYSICAL EXAMINATION: General:  NAD Neuro: Fully oriented, moving all ext, PERRLA HEENT:  WNL Neck: No JVD Cardiovascular:  IRIR, no M noted Lungs:  Min B exp wheezes, diminished BS throughout .  Abdomen:  Soft, nontender, BS+ Ext: 1+ LE edema GU: foley in place    Recent Labs Lab 08/02/12 0545 08/04/12 0535 08/06/12 0430  NA 141 142 139  K 4.6 4.3 4.2  CL 98 95* 94*  CO2 33* 38* 39*  BUN 53* 58* 68*  CREATININE 1.88* 2.18* 2.26*  GLUCOSE 103* 89 103*    Recent Labs Lab 07/31/12 0535 08/01/12 0500 08/02/12 0545 08/03/12 0300 08/04/12 0535 08/06/12 0430  NA 137 139 141  --  142 139  K 4.9 5.4* 4.6  --  4.3 4.2  CL 102 100 98  --  95*  94*  CO2 28 32 33*  --  38* 39*  GLUCOSE 140* 163* 103*  --  89 103*  BUN 55* 56* 53*  --  58* 68*  CREATININE 1.90* 1.82* 1.88*  --  2.18* 2.26*  CALCIUM 8.8 8.9 9.5  --  9.4 8.7  MG  --   --  1.7 1.7 1.7  --   PHOS  --   --   --  3.7  --   --      Recent Labs Lab 07/31/12 0535 08/06/12 0430  HGB 11.0* 11.9*  HCT 34.3* 35.7*  WBC 10.5 16.1*  PLT 137* 153    Recent Labs Lab 07/31/12 0535 08/01/12 0500 08/03/12 0300 08/06/12 0430  PROBNP 3699.0* 4309.0* 4088.0* 922.0*      Recent Labs Lab 08/02/12 1035 08/02/12 1952 08/03/12 0300  TROPONINI <0.30 <0.30 <0.30    Recent Labs Lab 08/01/12 1649  PHART 7.382  PCO2ART 53.0*  PO2ART 77.1*  HCO3 30.8*  TCO2 32.4  O2SAT 96.7    IMAGING x 48h No results found.  ASSESSMENT: Acute on Chronic Resp Failure with hypercarbia Acute on Chronic renal insuff versus Worsened CRI (baseline creat 1.08  last checked Augst 2012)  Acute exacerbation of O2 dependent COPD Ongoing tobacco abuse despite counseling.  CAD  Chronic AFib   Acute on Chronic diastolic CHF Dysuria/ frequency ? Foley related  In pt with CRI > urology eva  7/5   PLAN #RESP - nocturnal CPAP as per home settings as of 7/5, prn during the day - BDs  > pulmicort, atrovent, xopenex - oral pred taper  -Off all ABX  #A Fib and diast dysfuntion - lasix changed to PO 60 bid - saline lock   - home cardiac meds > multaq, bystolic, imdur, dilt PO> rate controlled off cardizem drip since 7/4   #General - Oral diet - Saline lock IV - SSI coverage while on systemic steroids - PT consult  Tfr to floor  08/06/12: Wife and patient updated    Dorcas Carrow Pulmonary and Critical Care Medicine [facility]  [phone]  Cell  [phone]  If no response or cell goes to voicemail, call beeper [phone]

## 2012-08-06 NOTE — Care Management Note (Signed)
    Page 1 of 2   08/07/2012     4:51:59 PM   CARE MANAGEMENT NOTE 08/07/2012  Patient:  [name], [name]   Account Number:  [id number]  Date Initiated:  07/30/2012  Documentation initiated by:  [facility]  Subjective/Objective Assessment:   Admitted with increased SOB.  Placed on bipap.  Has wife.     Action/Plan:   PT eval   Anticipated DC Date:  08/07/2012   Anticipated DC Plan:  HOME W HOME HEALTH SERVICES      DC Planning Services  CM consult      [facility] Choice  HOME HEALTH  DURABLE MEDICAL EQUIPMENT   Choice offered to / List presented to:  C-3 Spouse   DME arranged  [name]      DME agency  [facility]     HH arranged  HH-1 RN  HH-2 PT      [facility] agency  [facility]   Status of service:  In process, will continue to follow Medicare Important Message given?   (If response is "NO", the following Medicare IM given date fields will be blank) Date Medicare IM given:   Date Additional Medicare IM given:    Discharge Disposition:  HOME W HOME HEALTH SERVICES  Per UR Regulation:  Reviewed for med. necessity/level of care/duration of stay  If discussed at Long Length of Stay Meetings, dates discussed:   08/07/2012    Comments:  [place], [name] Spouse [phone]   [phone]                 [name],Joseph   [phone] 08/07/12 Joseph Spruell,RN,BSN [phone] PT FOR LIKELY DC ON 7/9; WILL NEED HH FOLLOW UP.  REFERRAL TO [facility], PER PT CHOICE.  PT REQUESTS ROLLATOR WALKER FOR HOME.  REFERRAL TO [facility] FOR DME NEEDS.  START OF CARE FOR HH, 24-48H POST DC DATE.  08/06/12- 1400- Joseph Pierini RN, BSN [phone] Per PT notes- recommending HH-PT and RW- in to speak with pt and wife- pt sleeping - spoke with wife regarding PT recommendations- per conversation they are agreeable to recommendations and would like HH-PT and a RW for home- list of HH agencies for [facility] given to wife and explanations given on [facility] and how services work.  will have 2000 unit CM f/u with wife as pt is tx to 2041 in the next few minutes. MD please order HH-PT and RW for home prior to discharge.  08-02-12 [address] [name], [place] [phone] Admitted with increased SOB.  Placed on bipap. Barrier iv lasix and cardizem gtt. PT to consult for disposition.

## 2012-08-06 NOTE — Progress Notes (Signed)
HOME HEALTH AGENCIES SERVING [place]   Agencies that are Medicare-Certified and are affiliated with [facility]  Telephone Number Address  [facility]   [facility] has ownership interest in this company; however, you are under no obligation to use this agency. [phone] or  [phone] [address] [place], [place] [address] [email or web address]   Agencies that are Medicare-Certified and are not affiliated with [facility] [place] [place] [facility]                                                                                 Home Health Agency Telephone Number Address  [facility] [phone] Fax [phone] [address] [place], [place]  [address] [email or web address]  [facility] [phone] or [phone] Fax [phone] [address] [place], [place] [address] [email or web address]  [facility] [phone] Fax [phone] [address] [place], [place] [address] [email or web address]  [facility] [phone] Fax [phone] [address] [place], [place]  [address] [email or web address]  [facility] [phone] Fax [phone] [address] [place], [place] [address] [email or web address]  [facility] [phone] [address] [place], [place] [address] [email or web address]  [facility] [phone]  [address] [place], [place] [address] [email or web address]  [facility] [phone] or [phone] Fax number [phone] [address] [place], [place]  [address] [email or web address]  [facility] [phone] Fax [phone] [address] [place], [place]  [address]  [facility]  [phone] Fax [phone] [address] [place], [place] [address] http://[email or web address]       Agencies that are not Medicare-Certified and are not affiliated with [facility]   Home Health Agency Telephone Number Address  [facility] [phone] or [phone] Fax [phone] [address] [place], [place]  [address] [email or web address]  [facility] [phone] Fax [phone] [address] [place], [place]  [address] [email or web address]  [facility] [phone] Fax [phone] [address] [place], [place]  [address] [email or web address]  [facility]  [phone] Fax [phone] [address] [place], [place] [address] http://[email or web address]  [facility] [phone] Fax [phone] [address] [place], [place] [address]  [facility] [phone] or [phone] Fax [phone] [address] [place], [place]  [address] http://[email or web address]  [facility] [phone] or [phone] Fax [phone] [address] [place], [place]  [address] http://[email or web address]  [facility] [phone] Fax [phone] [address] [place], [place]  [address] [email or web address]  [facility] [phone] [address] [place], [place]  [address]  [facility] [phone] Fax [phone] [address] [place], [place]  [address]  [facility] [phone] Fax [phone] [address] , [place]  [address] [email or web address]  [facility] [phone] Fax [phone] [address] [place],  [address] [email or web address]  [facility] [phone] Fax [phone] [address] ,     [address] [email or web address]   

## 2012-08-06 NOTE — Progress Notes (Signed)
Pt tx to tele floor per MD order, VSS, wife at bedside, meds current to time.

## 2012-08-06 NOTE — Progress Notes (Signed)
Physical Therapy Treatment Patient Details Name: MACEDONIO SCALLON MRN: [id number] DOB: Nov 04, 1940 Today's Date: 08/06/2012 Time: 1478-2956 PT Time Calculation (min): 31 min  PT Assessment / Plan / Recommendation  PT Comments   Pt progressing with mobility and able to complete stairs today. Pt fatigued by end of session and feels he has to rely on RW currently for stability and endurance. Recommend RW for home and discussed the difference and benefits between RW and rollator both of which could be beneficial but pt currently wanting RW. Will continue to follow and pt required encouragement to stay OOB end of session.   Follow Up Recommendations  Home health PT;Supervision for mobility/OOB     Does the patient have the potential to tolerate intense rehabilitation     Barriers to Discharge        Equipment Recommendations  Rolling walker with 5" wheels    Recommendations for Other Services    Frequency     Progress towards PT Goals Progress towards PT goals: Progressing toward goals  Plan Discharge plan needs to be updated    Precautions / Restrictions Precautions Precautions: Fall   Pertinent Vitals/Pain No pain other than at catheter with transfers sats 91% on 3L with gait and stairs adn HR up to 132 with stairs    Mobility  Bed Mobility Supine to Sit: 5: Supervision;HOB flat;With rails Details for Bed Mobility Assistance: cueing for sequence to ease transition and elevation of penis and scrotum with transfer due to pain Transfers Sit to Stand: 5: Supervision;From bed Stand to Sit: 5: Supervision;To chair/3-in-1 Details for Transfer Assistance: cueing for hand placement and elevation of genitals to decrease pain Ambulation/Gait Ambulation/Gait Assistance: 5: Supervision Ambulation Distance (Feet): 250 Feet Assistive device: Rolling walker Ambulation/Gait Assistance Details: cues to step into RW and for directions Gait Pattern: Step-through pattern;Decreased stride  length Gait velocity: decreased Stairs: Yes Stairs Assistance: 5: Supervision Stairs Assistance Details (indicate cue type and reason): supervision to manage lines Stair Management Technique: One rail Left;Alternating pattern;Forwards Number of Stairs: 12    Exercises     PT Diagnosis:    PT Problem List:   PT Treatment Interventions:     PT Goals (current goals can now be found in the care plan section)    Visit Information  Last PT Received On: 08/06/12 Assistance Needed: +1 History of Present Illness: Pt is a smoker admitted with SOB with need for Bipap secondary to poor oxygenation and poor ABG's.  COPd    Subjective Data      Cognition  Cognition Arousal/Alertness: Awake/alert Behavior During Therapy: WFL for tasks assessed/performed Overall Cognitive Status: Within Functional Limits for tasks assessed    Balance     End of Session PT - End of Session Equipment Utilized During Treatment: Gait belt;Oxygen Activity Tolerance: Patient tolerated treatment well Patient left: in chair;with call bell/phone within reach;with family/visitor present   GP     Toney Sang [facility] 08/06/2012, 10:25 AM Delaney Meigs, PT [phone]

## 2012-08-07 LAB — GLUCOSE, CAPILLARY
Glucose-Capillary: 132 mg/dL — ABNORMAL HIGH (ref 70–99)
Glucose-Capillary: 101 mg/dL — ABNORMAL HIGH (ref 70–99)

## 2012-08-07 NOTE — Progress Notes (Addendum)
PULMONARY  / CRITICAL CARE MEDICINE  Name: [name] MRN: [id number] DOB: 1940/11/23 PCP Londell Moh, MD     ADMISSION DATE:  07/28/2012 CONSULTATION DATE:  07/28/12  REFERRING MD :  Fonnie Jarvis PRIMARY SERVICE:  PCCM  CHIEF COMPLAINT:  Short of Breath  BRIEF PATIENT DESCRIPTION: 57 yoM smoker with COPD NOS followed by Dr Shelle Iron, hx CAD, AFib, OSA on nocturnal CPAP with 2L o2 at night; adm via ED with 3 days of increasing dyspnea he blames on smoking too much. Placed on BIPAP in ED. PCCM called to admit.  SIGNIFICANT EVENTS / STUDIES:  Urology consult 7/5 > foley replaced >> 7/8 removed by urology     ANTIBIOTICS: Rocephin 6/28 >> 7/1 levaquin 7/1 > 7/4     SUBJECTIVE/OVERNIGHT/INTERVAL HX Afebrile Dyspneic on walking to BR Voided after foley removal   VITAL SIGNS: Temp:  [97.8 F (36.6 C)-98.8 F (37.1 C)] 97.8 F (36.6 C) (07/08 0419) Pulse Rate:  [76-90] 83 (07/08 1201) Resp:  [19-20] 19 (07/08 0419) BP: (87-122)/(53-71) 122/70 mmHg (07/08 1201) SpO2:  [93 %-100 %] 96 % (07/08 0902) FiO2 (%):  [28 %] 28 % (07/08 0144) Weight:  [188 lb 8 oz (85.503 kg)] 188 lb 8 oz (85.503 kg) (07/08 0419)  Intake/Output Summary (Last 24 hours) at 08/07/12 1253 Last data filed at 08/07/12 0600  Gross per 24 hour  Intake    720 ml  Output   2275 ml  Net  -1555 ml   PHYSICAL EXAMINATION: General:  NAD Neuro: Fully oriented, moving all ext, PERRLA HEENT:  WNL Neck: No JVD Cardiovascular:  IRIR, no M noted Lungs:  Min B exp wheezes, diminished BS throughout .  Abdomen:  Soft, nontender, BS+ Ext: 1+ LE edema GU: foley out    Recent Labs Lab 08/02/12 0545 08/04/12 0535 08/06/12 0430  NA 141 142 139  K 4.6 4.3 4.2  CL 98 95* 94*  CO2 33* 38* 39*  BUN 53* 58* 68*  CREATININE 1.88* 2.18* 2.26*  GLUCOSE 103* 89 103*    Recent Labs Lab 08/01/12 0500 08/02/12 0545 08/03/12 0300 08/04/12 0535 08/06/12 0430  NA 139 141  --  142 139  K 5.4* 4.6  --   4.3 4.2  CL 100 98  --  95* 94*  CO2 32 33*  --  38* 39*  GLUCOSE 163* 103*  --  89 103*  BUN 56* 53*  --  58* 68*  CREATININE 1.82* 1.88*  --  2.18* 2.26*  CALCIUM 8.9 9.5  --  9.4 8.7  MG  --  1.7 1.7 1.7  --   PHOS  --   --  3.7  --   --      Recent Labs Lab 08/06/12 0430  HGB 11.9*  HCT 35.7*  WBC 16.1*  PLT 153    Recent Labs Lab 08/01/12 0500 08/03/12 0300 08/06/12 0430  PROBNP 4309.0* 4088.0* 922.0*      Recent Labs Lab 08/02/12 1035 08/02/12 1952 08/03/12 0300  TROPONINI <0.30 <0.30 <0.30    Recent Labs Lab 08/01/12 1649  PHART 7.382  PCO2ART 53.0*  PO2ART 77.1*  HCO3 30.8*  TCO2 32.4  O2SAT 96.7    IMAGING x 48h No results found.  ASSESSMENT: Acute on Chronic Resp Failure with hypercarbia Acute on Chronic renal insuff versus Worsened CRI (baseline creat 1.08  Augst 2012) Acute exacerbation of O2 dependent COPD Ongoing tobacco abuse despite counseling.  CAD  Chronic AFib   Acute on  Chronic diastolic CHF Dysuria/ frequency ? Foley related  In pt with CRI > urology eva  7/5   PLAN #RESP - nocturnal CPAP as per home settings as of 7/5, prn during the day - BDs  > pulmicort, atrovent, xopenex - oral pred taper  -Off all ABX  #A Fib and diast dysfuntion - bystolic for rate control - home cardiac meds > multaq, bystolic, imdur, dilt PO> rate controlled off cardizem drip since 7/4   # AKI - dc lasix & potassium since cr rising -consider change from apixaban if GFR drops lower (32 now)  # urinary retention with hematuria. - foley out Ensure on finasteride & tamsulosin on dc  #General - Oral diet - Saline lock IV - SSI coverage while on systemic steroids - PT consult -Home care requested  08/07/12: Wife and patient updated    Isla Pence MD. Tonny Bollman. [facility] Pager [phone] If no response call [phone]

## 2012-08-07 NOTE — Progress Notes (Signed)
Subjective: Patient reports no catheter related issues Objective: Vital signs in last 24 hours: Temp:  [97.8 F (36.6 C)-98.8 F (37.1 C)] 97.8 F (36.6 C) (07/08 0419) Pulse Rate:  [76-132] 85 (07/08 0419) Resp:  [17-20] 19 (07/08 0419) BP: (87-124)/(53-83) 110/71 mmHg (07/08 0419) SpO2:  [91 %-100 %] 97 % (07/08 0419) FiO2 (%):  [28 %] 28 % (07/08 0144) Weight:  [85.503 kg (188 lb 8 oz)] 85.503 kg (188 lb 8 oz) (07/08 0419)  Intake/Output from previous day: 07/07 0701 - 07/08 0700 In: 720 [P.O.:720] Out: 2825 [Urine:2825] Intake/Output this shift:    Physical Exam:  Constitutional: Vital signs reviewed. WD WN in NAD   Eyes: PERRL, No scleral icterus.  Urine clear w/o blood  Lab Results:  Recent Labs  08/06/12 0430  HGB 11.9*  HCT 35.7*   BMET  Recent Labs  08/06/12 0430  NA 139  K 4.2  CL 94*  CO2 39*  GLUCOSE 103*  BUN 68*  CREATININE 2.26*  CALCIUM 8.7   No results found for this basename: LABPT, INR,  in the last 72 hours No results found for this basename: LABURIN,  in the last 72 hours Results for orders placed during the hospital encounter of 07/28/12  CULTURE, BLOOD (ROUTINE X 2)     Status: None   Collection Time    07/28/12 12:40 PM      Result Value Range Status   Specimen Description BLOOD LEFT ARM   Final   Special Requests BOTTLES DRAWN AEROBIC AND ANAEROBIC 10CC   Final   Culture  Setup Time 07/28/2012 20:55   Final   Culture NO GROWTH 5 DAYS   Final   Report Status 08/03/2012 FINAL   Final  CULTURE, BLOOD (ROUTINE X 2)     Status: None   Collection Time    07/28/12  1:00 PM      Result Value Range Status   Specimen Description BLOOD RIGHT HAND   Final   Special Requests BOTTLES DRAWN AEROBIC AND ANAEROBIC 10CC   Final   Culture  Setup Time 07/28/2012 20:56   Final   Culture NO GROWTH 5 DAYS   Final   Report Status 08/03/2012 FINAL   Final  MRSA PCR SCREENING     Status: None   Collection Time    07/28/12  3:22 PM   Result Value Range Status   MRSA by PCR NEGATIVE  NEGATIVE Final   Comment:            The GeneXpert MRSA Assay (FDA     approved for NASAL specimens     only), is one component of a     comprehensive MRSA colonization     surveillance program. It is not     intended to diagnose MRSA     infection nor to guide or     monitor treatment for     MRSA infections.  URINE CULTURE     Status: None   Collection Time    07/28/12  9:40 PM      Result Value Range Status   Specimen Description URINE, RANDOM   Final   Special Requests ADDED 2159   Final   Culture  Setup Time 07/29/2012 03:32   Final   Colony Count NO GROWTH   Final   Culture NO GROWTH   Final   Report Status 07/30/2012 FINAL   Final    Studies/Results: No results found.  Assessment/Plan:   urinary retention with  hematuria. The hematuria has resolved. He is on medical therapy for his BPH. I have removed his catheter. I'll order a bladder scan to check residuals. If he does go home following adequate voiding, make sure he does go home on finasteride and tamsulosin.    LOS: 10 days   Marcine Matar M 08/07/2012, 7:56 AM

## 2012-08-08 LAB — CBC
Hemoglobin: 12 g/dL — ABNORMAL LOW (ref 13.0–17.0)
MCV: 90 fL (ref 78.0–100.0)
Platelets: 157 10*3/uL (ref 150–400)
RBC: 4.02 MIL/uL — ABNORMAL LOW (ref 4.22–5.81)
WBC: 10.7 10*3/uL — ABNORMAL HIGH (ref 4.0–10.5)

## 2012-08-08 LAB — BASIC METABOLIC PANEL
CO2: 36 mEq/L — ABNORMAL HIGH (ref 19–32)
Calcium: 9.1 mg/dL (ref 8.4–10.5)
GFR calc non Af Amer: 30 mL/min — ABNORMAL LOW (ref >90)
Potassium: 4.5 mEq/L (ref 3.5–5.1)
Sodium: 141 mEq/L (ref 135–145)

## 2012-08-08 LAB — GLUCOSE, CAPILLARY: Glucose-Capillary: 88 mg/dL (ref 70–99)

## 2012-08-08 MED ORDER — DILTIAZEM HCL ER 90 MG PO CP12: 180.0000 mg | ORAL_CAPSULE | Freq: Two times a day (BID) | ORAL | Status: DC

## 2012-08-08 MED ORDER — TAMSULOSIN HCL 0.4 MG PO CAPS: 0.4000 mg | ORAL_CAPSULE | Freq: Every day | ORAL | Status: DC

## 2012-08-08 MED ORDER — PREDNISONE 10 MG PO TABS: ORAL_TABLET | ORAL | Status: DC

## 2012-08-08 MED ORDER — BUDESONIDE-FORMOTEROL FUMARATE 160-4.5 MCG/ACT IN AERO: 2.0000 | INHALATION_SPRAY | Freq: Two times a day (BID) | RESPIRATORY_TRACT | Status: DC

## 2012-08-08 MED ORDER — ISOSORBIDE MONONITRATE 15 MG HALF TABLET: 15.0000 mg | ORAL_TABLET | Freq: Every day | ORAL | Status: DC

## 2012-08-08 MED ORDER — FINASTERIDE 5 MG PO TABS: 5.0000 mg | ORAL_TABLET | Freq: Every day | ORAL | Status: DC

## 2012-08-08 NOTE — Progress Notes (Signed)
Physical Therapy Treatment Patient Details Name: [name] MRN: [id number] DOB: Sep 21, 1940 Today's Date: 08/08/2012 Time: 7829-5621 PT Time Calculation (min): 12 min  PT Assessment / Plan / Recommendation  PT Comments   Pt did well with rollator.  Follow Up Recommendations  Home health PT;Supervision - Intermittent     Does the patient have the potential to tolerate intense rehabilitation     Barriers to Discharge        Equipment Recommendations  Other (comment) (rollator)    Recommendations for Other Services    Frequency Min 3X/week   Progress towards PT Goals Progress towards PT goals: Progressing toward goals  Plan Current plan remains appropriate    Precautions / Restrictions Precautions Precautions: Fall   Pertinent Vitals/Pain Dyspnea 3/4 with amb on 2L O2    Mobility  Transfers Sit to Stand: With upper extremity assist;From bed;5: Supervision Stand to Sit: 5: Supervision;With upper extremity assist;To bed Details for Transfer Assistance: cues for hand placement Ambulation/Gait Ambulation/Gait Assistance: 5: Supervision Ambulation Distance (Feet): 350 Feet Assistive device: Rollator Ambulation/Gait Assistance Details: 1 sitting rest break on rollator.  Instructed in use of rollator and how to lock brakes.  Gait Pattern: Step-through pattern;Decreased stride length Gait velocity: decreased    Exercises     PT Diagnosis:    PT Problem List:   PT Treatment Interventions:     PT Goals (current goals can now be found in the care plan section)    Visit Information  Last PT Received On: 08/08/12 Assistance Needed: +1 History of Present Illness: Pt is a smoker admitted with SOB with need for Bipap secondary to poor oxygenation and poor ABG's.  COPd    Subjective Data      Cognition  Cognition Arousal/Alertness: Awake/alert Behavior During Therapy: WFL for tasks assessed/performed Overall Cognitive Status: Within Functional Limits for tasks  assessed    Balance     End of Session PT - End of Session Equipment Utilized During Treatment: Oxygen Activity Tolerance: Patient tolerated treatment well Patient left: in bed;with call bell/phone within reach;with family/visitor present (sitting EOB)   GP     Egor Fullilove 08/08/2012, 9:33 AM  [facility] PT [phone]

## 2012-08-08 NOTE — Progress Notes (Signed)
Patient placed self on home CPAP. Does not like humidifaction.

## 2012-08-08 NOTE — Discharge Summary (Signed)
Physician Discharge Summary     Patient ID: [name] MRN: [id number] DOB/AGE: [birth year] 72 y.o.  Admit date: 07/28/2012 Discharge date: 08/08/2012  Discharge Diagnoses:  Acute on Chronic Resp Failure with hypercarbia  Acute on Chronic renal insuff versus Worsened CRI (baseline creat 1.08 Augst 2012)  Acute exacerbation of O2 dependent COPD  Ongoing tobacco abuse despite counseling.  CAD  Chronic AFib  Acute on Chronic diastolic CHF  Dysuria/ frequency  Physical deconditioning   Detailed Hospital Course:  Recently seen by Dr [name] for known COPD with home O2 2L/[employer], CPAP/ [employer].. Repeated smoking cessation counseling, but still 1 PPD. CAD/ stent by Dr [name]. Afib on Eliquis, failed cardioversion. Admitted on 6/28 w/  3 days of worsening dyspnea with wheeze, cough scant yellow, no blood. Denied fever, chills, pain, swelling, GI upset or other change. He just says he was smoking more heavily. Wife thought he might have gotten chilled sitting out on porch.   Admitted for treatment of Acute on Chronic respiratory failure felt to be multi-factorial: AECOPD and prob element of decompensated OSA. Therapeutic interventions were as follows: NIPPV, scheduled BDs, systemic steroids, empiric antibiotics and ICU monitoring. From a pulm standpoint we were able to get him off continued NIPPV on7/1, steroid taper was started and we began diuresis to treat what we felt was a component of edema in setting of likely decompensated diastolic dysfunction. His course was initially complicated by rising serum creatinine, his peak creatinine was 2.26.  This was likely multifactorial and due to escalated diuretic regimen and possibly exacerbation of his BPH. He also problems with urinary frequency and hematuria s/p his initial foley removal. The symptoms were severe so we consulted Urology. They felt that the symptoms were probably exacerbation of his BPH w/ hematuria likely being traumatic in nature. They  placed a Coude catheter and initiated tamsulosin and added finasteride. The foley urine output cleared and the catheter was successfully removed on 7/8 with recommendations to continue his current medical regimen. As of 7/9 his serum creatinine is starting to fall after stopping diuretics.    On discharge day 7/9 he reports he has improved to close to baseline status. He has completed all antibiotics (treated from 6/28 to 7/4), he is back on his home CPAP settings, and on a oral pred taper and baseline O2 at 2 liters during day. He will be discharged with plan to continue weaning pred, instructed again to stop smoking with plan to f/u in our office in 1 week.    Discharge Plan by diagnoses   #RESP  - nocturnal CPAP as per home settings as of 7/5, prn during the day  - BDs > pulmicort, atrovent, xopenex  - oral pred taper  -Off all ABX   #A Fib and diast dysfuntion  Plan: - bystolic for rate control  - home cardiac meds > multaq, bystolic, imdur, dilt PO> rate controlled off cardizem drip since 7/4   # AKI - dc'd lasix & potassium since cr rising.  Plan:We will continue to hold his lasix and recheck his creatinine in our office follow-up. May consider restarting diuretics at that time.  -consider change from apixaban if GFR drops lower than 30   # urinary retention with hematuria. - foley out  Plan: Ensure on finasteride & tamsulosin on dc   # physical deconditioning He was seen by physical therapy, they recommend home therapy, we will initiate this.  Significant Hospital tests/ studies/ interventions and procedures  Consults Urology  ABX Rocephin 6/28 >> 7/1  levaquin 7/1 > 7/4  Discharge Exam: BP 127/71  Pulse 94  Temp(Src) 98.6 F (37 C) (Oral)  Resp 19  Ht 5\' 11"  (1.803 m)  Wt 85.639 kg (188 lb 12.8 oz)  BMI 26.34 kg/m2  SpO2 93% 2 L oxygen via nasal cannula  General: NAD  Neuro: Fully oriented, moving all ext, PERRLA  HEENT: WNL  Neck: No JVD  Cardiovascular:  IRIR, no M noted  Lungs: Min B exp wheezes, diminished BS throughout .  Abdomen: Soft, nontender, BS+  Ext: 1+ LE edema  GU: foley out   Labs at discharge Lab Results  Component Value Date   CREATININE 2.13* 08/08/2012   BUN 58* 08/08/2012   NA 141 08/08/2012   K 4.5 08/08/2012   CL 99 08/08/2012   CO2 36* 08/08/2012   Lab Results  Component Value Date   WBC 10.7* 08/08/2012   HGB 12.0* 08/08/2012   HCT 36.2* 08/08/2012   MCV 90.0 08/08/2012   PLT 157 08/08/2012   Lab Results  Component Value Date   ALT 15 07/28/2012   AST 17 07/28/2012   ALKPHOS 75 07/28/2012   BILITOT 0.3 07/28/2012   Lab Results  Component Value Date   INR 1.47 07/28/2012   INR 0.94 03/27/2010    Current radiology studies No results found.  Disposition:  01-Home or Self Care      Discharge Orders   Future Appointments Provider Department Dept Phone   08/16/2012 9:15 AM Joseph Sicks, NP [facility] [phone]   11/12/2012 9:15 AM Joseph Share, MD [facility] [phone]   Future Orders Complete By Expires     (HEART FAILURE PATIENTS) Call MD:  Anytime you have any of the following symptoms: 1) 3 pound weight gain in 24 hours or 5 pounds in 1 week 2) shortness of breath, with or without a dry hacking cough 3) swelling in the hands, feet or stomach 4) if you have to sleep on extra pillows at night in order to breathe.  As directed     Call MD for:  difficulty breathing, headache or visual disturbances  As directed     Call MD for:  temperature >100.4  As directed     Diet - low sodium heart healthy  As directed     Increase activity slowly  As directed         Medication List    STOP taking these medications       diltiazem 120 MG tablet  Commonly known as:  CARDIZEM  Replaced by:  diltiazem 90 MG 12 hr capsule     hydrochlorothiazide 12.5 MG capsule  Commonly known as:  MICROZIDE     losartan 100 MG tablet  Commonly known as:  COZAAR      TAKE these medications        albuterol 108 (90 BASE) MCG/ACT inhaler  Commonly known as:  PROVENTIL HFA;VENTOLIN HFA  Inhale 2 puffs into the lungs every 6 (six) hours as needed.     albuterol 108 (90 BASE) MCG/ACT inhaler  Commonly known as:  PROVENTIL HFA;VENTOLIN HFA  Inhale 2 puffs into the lungs every 6 (six) hours as needed for wheezing.     aspirin 81 MG tablet  Take 81 mg by mouth daily.     atorvastatin 40 MG tablet  Commonly known as:  LIPITOR  1 by mouth at bedtime     budesonide-formoterol 160-4.5 MCG/ACT inhaler  Commonly known as:  SYMBICORT  Inhale 2 puffs into the lungs 2 (two) times daily.     diltiazem 90 MG 12 hr capsule  Commonly known as:  CARDIZEM SR  Take 2 capsules (180 mg total) by mouth every 12 (twelve) hours.     dronedarone 400 MG tablet  Commonly known as:  MULTAQ  Take 1 tablet (400 mg total) by mouth 2 (two) times daily with a meal.     ELIQUIS 5 MG Tabs tablet  Generic drug:  apixaban  Take 5 mg by mouth 2 (two) times daily.     finasteride 5 MG tablet  Commonly known as:  PROSCAR  Take 1 tablet (5 mg total) by mouth daily.     fish oil-omega-3 fatty acids 1000 MG capsule  Once daily     isosorbide mononitrate 15 mg Tb24  Commonly known as:  IMDUR  Take 0.5 tablets (15 mg total) by mouth daily.     multivitamin with minerals tablet  Take 1 tablet by mouth daily.     nebivolol 10 MG tablet  Commonly known as:  BYSTOLIC  Take two tablets daily.     pantoprazole 40 MG tablet  Commonly known as:  PROTONIX  Take 40 mg by mouth daily.     predniSONE 10 MG tablet  Commonly known as:  DELTASONE  Take 2 tabs daily for 3 days, then 1 tab daily for 3 days and stop     tamsulosin 0.4 MG Caps  Commonly known as:  FLOMAX  Take 1 capsule (0.4 mg total) by mouth daily.     tiotropium 18 MCG inhalation capsule  Commonly known as:  SPIRIVA  Place 18 mcg into inhaler and inhale daily.     Vitamin D 2000 UNITS Caps  Take 1 capsule by mouth daily.       Follow-up  Information   Follow up with PARRETT,TAMMY, NP On 08/16/2012. (915am)    Contact information:   [address] [place] [address] [phone]       Follow up with Lennette Bihari, MD. (keep your next scheduled appointment )    Contact information:   [address] [place] [address] [phone]        Discharged Condition: good    Signed: BABCOCK,PETE 08/08/2012, 11:28 AM  Physician Statement:   The Patient was personally examined, the discharge assessment and plan has been personally reviewed and I agree with ACNP Babcock's assessment and plan. > 30 minutes of time have been dedicated to discharge assessment, planning and discharge instructions.   Anilah Huck V.

## 2012-08-09 ENCOUNTER — Telehealth: Payer: Self-pay | Admitting: Cardiovascular Disease

## 2012-08-09 NOTE — Telephone Encounter (Signed)
[name] is just getting out of the hospital and wants to know if he should still have the  Cardio Version ...  Thanks

## 2012-08-09 NOTE — Telephone Encounter (Signed)
This is a duplicate message. Already have a message from [employer].Will address with Dr. Tresa Endo tomorrow when he comes into the office.

## 2012-08-09 NOTE — Telephone Encounter (Signed)
Returned call and spoke w/ pt's wife, Delray Alt.  Stated pt just got out of the hospital after staying for 13 days.  Stated pt is still weak and wanted to know if he still needs to have the cardioversion right now.  Informed Dr. Pierre Bali will be notified for further instructions.  Verbalized understanding and agreed w/ plan.

## 2012-08-14 ENCOUNTER — Telehealth: Payer: Self-pay | Admitting: *Deleted

## 2012-08-14 NOTE — Telephone Encounter (Signed)
Spoke with patient's wife informing her per Dr. Tresa Endo if patient's lungs are not clear then he will need to defer the cardioversion. Wife states that patient is still not well. Procedure will be cancelled for tomorrow and rescheduled when patient is feeling better.Wife states that she will call to notify when patient is better.

## 2012-08-15 ENCOUNTER — Encounter (HOSPITAL_COMMUNITY): Admission: RE | Payer: Self-pay | Source: Ambulatory Visit

## 2012-08-15 ENCOUNTER — Ambulatory Visit (HOSPITAL_COMMUNITY): Admission: RE | Admit: 2012-08-15 | Payer: Medicare Other | Source: Ambulatory Visit | Admitting: Cardiovascular Disease

## 2012-08-15 SURGERY — CARDIOVERSION
Anesthesia: Monitor Anesthesia Care

## 2012-08-15 NOTE — Telephone Encounter (Signed)
DCCV cancelled due to patient recovering from a severe lung infection.

## 2012-08-16 ENCOUNTER — Inpatient Hospital Stay (HOSPITAL_COMMUNITY)
Admission: EM | Admit: 2012-08-16 | Discharge: 2012-08-18 | DRG: 310 | Disposition: A | Discharge status: Home / Self Care | Payer: Medicare Other | Attending: Cardiovascular Disease | Admitting: Cardiovascular Disease

## 2012-08-16 ENCOUNTER — Encounter (HOSPITAL_COMMUNITY): Payer: Self-pay | Admitting: Emergency Medicine

## 2012-08-16 ENCOUNTER — Emergency Department (HOSPITAL_COMMUNITY): Payer: Medicare Other

## 2012-08-16 ENCOUNTER — Ambulatory Visit (INDEPENDENT_AMBULATORY_CARE_PROVIDER_SITE_OTHER): Payer: Medicare Other | Admitting: Adult Health

## 2012-08-16 ENCOUNTER — Other Ambulatory Visit: Payer: Self-pay

## 2012-08-16 ENCOUNTER — Encounter: Payer: Self-pay | Admitting: Adult Health

## 2012-08-16 VITALS — BP 142/60 | HR 140 | Temp 97.8°F | Ht 71.0 in | Wt 195.2 lb

## 2012-08-16 DIAGNOSIS — Z882 Allergy status to sulfonamides status: Secondary | ICD-10-CM

## 2012-08-16 DIAGNOSIS — I4891 Unspecified atrial fibrillation: Secondary | ICD-10-CM

## 2012-08-16 DIAGNOSIS — E669 Obesity, unspecified: Secondary | ICD-10-CM | POA: Diagnosis present

## 2012-08-16 DIAGNOSIS — E785 Hyperlipidemia, unspecified: Secondary | ICD-10-CM | POA: Diagnosis present

## 2012-08-16 DIAGNOSIS — G4733 Obstructive sleep apnea (adult) (pediatric): Secondary | ICD-10-CM

## 2012-08-16 DIAGNOSIS — F17200 Nicotine dependence, unspecified, uncomplicated: Secondary | ICD-10-CM

## 2012-08-16 DIAGNOSIS — I2510 Atherosclerotic heart disease of native coronary artery without angina pectoris: Secondary | ICD-10-CM

## 2012-08-16 DIAGNOSIS — Z8249 Family history of ischemic heart disease and other diseases of the circulatory system: Secondary | ICD-10-CM

## 2012-08-16 DIAGNOSIS — K219 Gastro-esophageal reflux disease without esophagitis: Secondary | ICD-10-CM | POA: Diagnosis present

## 2012-08-16 DIAGNOSIS — I10 Essential (primary) hypertension: Secondary | ICD-10-CM

## 2012-08-16 DIAGNOSIS — J449 Chronic obstructive pulmonary disease, unspecified: Secondary | ICD-10-CM

## 2012-08-16 DIAGNOSIS — Z836 Family history of other diseases of the respiratory system: Secondary | ICD-10-CM

## 2012-08-16 DIAGNOSIS — N184 Chronic kidney disease, stage 4 (severe): Secondary | ICD-10-CM

## 2012-08-16 DIAGNOSIS — I739 Peripheral vascular disease, unspecified: Secondary | ICD-10-CM | POA: Diagnosis present

## 2012-08-16 DIAGNOSIS — I714 Abdominal aortic aneurysm, without rupture, unspecified: Secondary | ICD-10-CM | POA: Diagnosis present

## 2012-08-16 DIAGNOSIS — Z7982 Long term (current) use of aspirin: Secondary | ICD-10-CM

## 2012-08-16 DIAGNOSIS — Z888 Allergy status to other drugs, medicaments and biological substances status: Secondary | ICD-10-CM

## 2012-08-16 DIAGNOSIS — Z9861 Coronary angioplasty status: Secondary | ICD-10-CM

## 2012-08-16 DIAGNOSIS — Z7901 Long term (current) use of anticoagulants: Secondary | ICD-10-CM

## 2012-08-16 DIAGNOSIS — Z79899 Other long term (current) drug therapy: Secondary | ICD-10-CM

## 2012-08-16 DIAGNOSIS — N183 Chronic kidney disease, stage 3 unspecified: Secondary | ICD-10-CM | POA: Diagnosis present

## 2012-08-16 DIAGNOSIS — I129 Hypertensive chronic kidney disease with stage 1 through stage 4 chronic kidney disease, or unspecified chronic kidney disease: Secondary | ICD-10-CM | POA: Diagnosis present

## 2012-08-16 DIAGNOSIS — J438 Other emphysema: Secondary | ICD-10-CM | POA: Diagnosis present

## 2012-08-16 DIAGNOSIS — Z8711 Personal history of peptic ulcer disease: Secondary | ICD-10-CM

## 2012-08-16 LAB — CBC WITH DIFFERENTIAL/PLATELET
Basophils Absolute: 0 10*3/uL (ref 0.0–0.1)
Basophils Relative: 0 % (ref 0–1)
Eosinophils Relative: 1 % (ref 0–5)
HCT: 36.2 % — ABNORMAL LOW (ref 39.0–52.0)
Hemoglobin: 12.3 g/dL — ABNORMAL LOW (ref 13.0–17.0)
MCHC: 34 g/dL (ref 30.0–36.0)
MCV: 90.5 fL (ref 78.0–100.0)
Monocytes Absolute: 1 10*3/uL (ref 0.1–1.0)
Monocytes Relative: 9 % (ref 3–12)
RDW: 13.8 % (ref 11.5–15.5)

## 2012-08-16 LAB — BASIC METABOLIC PANEL
BUN: 26 mg/dL — ABNORMAL HIGH (ref 6–23)
CO2: 25 mEq/L (ref 19–32)
Calcium: 9.3 mg/dL (ref 8.4–10.5)
Chloride: 105 mEq/L (ref 96–112)
Creatinine, Ser: 1.47 mg/dL — ABNORMAL HIGH (ref 0.50–1.35)
GFR calc Af Amer: 54 mL/min — ABNORMAL LOW (ref >90)

## 2012-08-16 LAB — TROPONIN I: Troponin I: <0.3 ng/mL (ref <0.30)

## 2012-08-16 MED ORDER — APIXABAN 5 MG PO TABS
5.0000 mg | ORAL_TABLET | Freq: Two times a day (BID) | ORAL | Status: DC
  Administered 2012-08-16 – 2012-08-18 (×4): 5 mg via ORAL
  Filled 2012-08-16 (×5): qty 1

## 2012-08-16 MED ORDER — SODIUM CHLORIDE 0.9 % IJ SOLN
3.0000 mL | Freq: Two times a day (BID) | INTRAMUSCULAR | Status: DC
  Administered 2012-08-16 – 2012-08-17 (×3): 3 mL via INTRAVENOUS

## 2012-08-16 MED ORDER — DILTIAZEM LOAD VIA INFUSION
20.0000 mg | Freq: Once | INTRAVENOUS | Status: AC
  Administered 2012-08-16: 20 mg via INTRAVENOUS
  Filled 2012-08-16: qty 20

## 2012-08-16 MED ORDER — HYDROCORTISONE 1 % EX CREA
1.0000 "application " | TOPICAL_CREAM | Freq: Three times a day (TID) | CUTANEOUS | Status: DC | PRN
  Filled 2012-08-16: qty 28

## 2012-08-16 MED ORDER — ALBUTEROL SULFATE HFA 108 (90 BASE) MCG/ACT IN AERS: 2.0000 | INHALATION_SPRAY | Freq: Four times a day (QID) | RESPIRATORY_TRACT | Status: DC | PRN

## 2012-08-16 MED ORDER — PANTOPRAZOLE SODIUM 40 MG PO TBEC
40.0000 mg | DELAYED_RELEASE_TABLET | Freq: Every day | ORAL | Status: DC
  Administered 2012-08-17: 40 mg via ORAL
  Filled 2012-08-16 (×2): qty 1

## 2012-08-16 MED ORDER — SODIUM CHLORIDE 0.9 % IV SOLN
250.0000 mL | INTRAVENOUS | Status: DC
  Administered 2012-08-16: 250 mL via INTRAVENOUS

## 2012-08-16 MED ORDER — SODIUM CHLORIDE 0.9 % IJ SOLN
3.0000 mL | INTRAMUSCULAR | Status: DC | PRN
  Administered 2012-08-17: 3 mL via INTRAVENOUS

## 2012-08-16 MED ORDER — FINASTERIDE 5 MG PO TABS
5.0000 mg | ORAL_TABLET | Freq: Every day | ORAL | Status: DC
  Administered 2012-08-16 – 2012-08-18 (×3): 5 mg via ORAL
  Filled 2012-08-16 (×3): qty 1

## 2012-08-16 MED ORDER — NEBIVOLOL HCL 10 MG PO TABS
10.0000 mg | ORAL_TABLET | Freq: Every day | ORAL | Status: DC
  Administered 2012-08-16 – 2012-08-18 (×3): 10 mg via ORAL
  Filled 2012-08-16 (×3): qty 1

## 2012-08-16 MED ORDER — DRONEDARONE HCL 400 MG PO TABS
400.0000 mg | ORAL_TABLET | Freq: Two times a day (BID) | ORAL | Status: DC
  Administered 2012-08-17: 400 mg via ORAL
  Filled 2012-08-16 (×3): qty 1

## 2012-08-16 MED ORDER — BUDESONIDE-FORMOTEROL FUMARATE 160-4.5 MCG/ACT IN AERO
2.0000 | INHALATION_SPRAY | Freq: Two times a day (BID) | RESPIRATORY_TRACT | Status: DC
  Administered 2012-08-16 – 2012-08-18 (×4): 2 via RESPIRATORY_TRACT
  Filled 2012-08-16: qty 6

## 2012-08-16 MED ORDER — ISOSORBIDE MONONITRATE 15 MG HALF TABLET
15.0000 mg | ORAL_TABLET | Freq: Every day | ORAL | Status: DC
  Administered 2012-08-16 – 2012-08-18 (×3): 15 mg via ORAL
  Filled 2012-08-16 (×3): qty 1

## 2012-08-16 MED ORDER — TIOTROPIUM BROMIDE MONOHYDRATE 18 MCG IN CAPS
18.0000 ug | ORAL_CAPSULE | Freq: Every day | RESPIRATORY_TRACT | Status: DC
  Administered 2012-08-16 – 2012-08-18 (×3): 18 ug via RESPIRATORY_TRACT
  Filled 2012-08-16: qty 5

## 2012-08-16 MED ORDER — DILTIAZEM HCL 100 MG IV SOLR
10.0000 mg/h | INTRAVENOUS | Status: DC
  Administered 2012-08-16 (×3): 10 mg/h via INTRAVENOUS

## 2012-08-16 MED ORDER — ATORVASTATIN CALCIUM 40 MG PO TABS
40.0000 mg | ORAL_TABLET | Freq: Every day | ORAL | Status: DC
  Administered 2012-08-16 – 2012-08-17 (×2): 40 mg via ORAL
  Filled 2012-08-16 (×3): qty 1

## 2012-08-16 MED ORDER — TAMSULOSIN HCL 0.4 MG PO CAPS
0.4000 mg | ORAL_CAPSULE | Freq: Every day | ORAL | Status: DC
  Administered 2012-08-16 – 2012-08-18 (×3): 0.4 mg via ORAL
  Filled 2012-08-16 (×3): qty 1

## 2012-08-16 NOTE — Assessment & Plan Note (Signed)
Recent exacerbation now resolving  Has finished steroid taper  Cont on Spiriva and Symbicort  Smoking cessation is imperative.

## 2012-08-16 NOTE — Progress Notes (Signed)
Pt. Refuses to wear CPAP at this time. Pt. Was made aware to call RT anytime during the night if he changed his mind & decided to wear CPAP.

## 2012-08-16 NOTE — Progress Notes (Signed)
Subjective:    Patient ID: [name], male    DOB: July 05, 1940, 72 y.o.   MRN: [id number]  HPI 72 yo male with known hx of severe COPD with chronic respiratory failure.    08/16/2012 Post Hospital follow up  Patient presents for a one-week post hospital followup. Patient was admitted June 28 through July 9 for a COPD exacerbation, complicated by atrial fibrillation with RVR. Patient was treated with him. Antibiotics, systemic steroids, and scheduled, nebulized bronchodilators. Patient was admitted to ICU with BiPAP support. He was weaned off of BiPAP on July 1. He was felt to have decompensated diastolic dysfunction, and was treated with diuresis. Diuretics were held for rising creatinine at 2.26. Patient did require a urology consult for symptomatic BPH with hematuria. He was started on Flomax and finasteride. Patient was transitioned back over to nocturnal CPAP. And discharged on a prednisone taper. He completed antibiotics prior to discharge. He did require a Cardizem drip. During his admission and was transitioned back over to multi-systolic and/or and oral diltiazem prior to discharge. Of note, patient has had a failed cardioversion in the past. He has outpatient follow with cardiology for chronic atrial fib.  Since discharge. Patient reports he continues to feel weak. Has had multiple episodes of  heart racing. Patient denies any hemoptysis, orthopnea, PND, or increased leg swelling. Cough and wheezing are better. No fever. No discolored mucus. Still smoking 1-1.5 PPD . Discussed smoking cessation. Takes O2 off to smoke. Advised of dangers.  He is on apixiban . Finished pred taper. He was  Suppose to go for Cardioversion this am but this was cancelled .     Review of Systems Constitutional:   No  weight loss, night sweats,  Fevers, chills, + fatigue, or  lassitude.  HEENT:   No headaches,  Difficulty swallowing,  Tooth/dental problems, or  Sore throat,                No sneezing,  itching, ear ache, nasal congestion, post nasal drip,   CV:  No chest pain,  Orthopnea, PND,  anasarca, dizziness,  syncope.   GI  No heartburn, indigestion, abdominal pain, nausea, vomiting, diarrhea, change in bowel habits, loss of appetite, bloody stools.   Resp: No chest wall deformity  Skin: no rash or lesions.  GU: no dysuria, change in color of urine, no urgency or frequency.  No flank pain, no hematuria   MS:  No joint pain or swelling.  No decreased range of motion.  No back pain.  Psych:  No change in mood or affect. No depression or anxiety.  No memory loss.         Objective:   Physical Exam GEN: A/Ox3; pleasant , NAD, well nourished   HEENT:  [place]/AT,  EACs-clear, TMs-wnl, NOSE-clear, THROAT-clear, no lesions, no postnasal drip or exudate noted.   NECK:  Supple w/ fair ROM; no JVD; normal carotid impulses w/o bruits; no thyromegaly or nodules palpated; no lymphadenopathy.  RESP  Diminshed BS in bases, no accessory muscle use, no dullness to percussion  CARD:  Irreg/irreg  no m/r/g  , tr-1+ peripheral edema, pulses intact, no cyanosis or clubbing.  GI:   Soft & nt; nml bowel sounds; no organomegaly or masses detected.  Musco: Warm bil, no deformities or joint swelling noted.   Neuro: alert, no focal deficits noted.    Skin: Warm, no lesions or rashes   EKG :  Atrial Fib -135-145 bpm  Assessment & Plan:

## 2012-08-16 NOTE — Progress Notes (Signed)
Ov reviewed, and discussed with NP.  Agree with plans as outlined.

## 2012-08-16 NOTE — Patient Instructions (Addendum)
Continue on Symbicort 2 puffs Twice daily  And Spiriva daily  YOU MUST QUIT SMOKING -THAT IS MOST IMPORTANT  We spoke with Dr. Tresa Endo and he wants you to go to ER at [facility]  To address your Atrial Fibrillation  Please contact office for sooner follow up if symptoms do not improve or worsen or seek emergency care  Follow up Dr. Shelle Iron in 2-3 weeks and As needed

## 2012-08-16 NOTE — ED Provider Notes (Signed)
11:57 AM  Date: 08/16/2012  Rate: 139  Rhythm: atrial fibrillation  QRS Axis: right  Intervals: normal QRS:  Poor R wave progression in precordial leads suggests old anterior myocardial infarction.    ST/T Wave abnormalities: normal  Conduction Disutrbances:none  Narrative Interpretation: Abnormal EKG  Old EKG Reviewed: unchanged    Carleene Cooper III, MD 08/16/12 [phone]

## 2012-08-16 NOTE — ED Provider Notes (Signed)
Medical screening examination/treatment/procedure(s) were conducted as a shared visit with non-physician practitioner(s) and myself.  I personally evaluated the patient during the encounter Pt with rapid atrial fibrillation, advised Rx with IV Cardizem bolus and continuous infusion.  Carleene Cooper III, MD 08/16/12 2038

## 2012-08-16 NOTE — Assessment & Plan Note (Addendum)
Atrial Fib with RVR  Persistent Afib with RVR despite multiple medications at max doses  Cardioversion cancelled this am,  Spoke with Dr. Tresa Endo group, pt to go to ER at [facility] to be evaluated.  EKG with HR 135-145bpm  Discussed with Dr. Shelle Iron and  aware  Does need bmet as off diuretic since discharge due to worsening renal insufficiency

## 2012-08-16 NOTE — ED Provider Notes (Signed)
History    CSN: [id number] Arrival date & time 08/16/12  1107  First MD Initiated Contact with Patient 08/16/12 1123     Chief Complaint  Patient presents with  . Irregular Heart Beat   (Consider location/radiation/quality/duration/timing/severity/associated sxs/prior Treatment) HPI Comments: Patient is a 72 year old male with a history of COPD, atrial fibrillation with attempted cardioversion in May 2014, coronary artery disease with stent placement in 2003, and sleep apnea (endorses irregular CPAP use) who presents from his PCP office for rapid heart rate. Patient states that he went for a routine physical exam this morning where his heart rate was found to be in the 140s. Patient is followed by Dr. Tresa Endo of [facility] advised the patient to come to the emergency department for further evaluation. Patient denies headache, vision changes, lightheadedness or dizziness, chest pain, palpitations, shortness of breath, nausea or vomiting, and numbness or tingling in his extremities.  The history is provided by the patient. No language interpreter was used.   Past Medical History  Diagnosis Date  . COPD (chronic obstructive pulmonary disease)   . Active smoker   . Sleep apnea     managed by Dr. Renne Crigler  . Obesity   . Coronary artery disease   . Benign neoplasm of colon   . Atrial fibrillation   . Pyelonephritis, unspecified   . Other and unspecified hyperlipidemia   . Personal history of peptic ulcer disease   . Elevated prostate specific antigen (PSA)   . Diaphragmatic hernia without mention of obstruction or gangrene   . Esophageal reflux   . Tobacco use disorder   . Unspecified essential hypertension   . Peripheral vascular disease    Past Surgical History  Procedure Laterality Date  . Coronary artery stent  2003  . Cardioversion N/A 06/07/2012    Procedure: CARDIOVERSION;  Surgeon: Lennette Bihari, MD;  Location[facility] ENDOSCOPY;  Service: Cardiovascular;   Laterality: N/A;   Family History  Problem Relation Age of Onset  . Heart disease Mother   . Coronary artery disease Father   . Emphysema Father    History  Substance Use Topics  . Smoking status: Current Every Day Smoker -- 1.00 packs/day for 55 years    Types: Cigarettes  . Smokeless tobacco: Never Used     Comment: started at age 41.  prev was 2 ppd.   . Alcohol Use: 1.8 oz/week    3 Shots of liquor per week    Review of Systems  Constitutional: Negative for fever.  Eyes: Negative for visual disturbance.  Respiratory: Negative for shortness of breath.   Cardiovascular: Negative for chest pain.  Gastrointestinal: Negative for nausea and vomiting.  Neurological: Negative for dizziness and light-headedness.  All other systems reviewed and are negative.    Allergies  Ranitidine hcl; Beta adrenergic blockers; Ramipril; Sulfonamide derivatives; and Ticlopidine hcl  Home Medications   Current Outpatient Rx  Name  Route  Sig  Dispense  Refill  . albuterol (PROVENTIL HFA;VENTOLIN HFA) 108 (90 BASE) MCG/ACT inhaler   Inhalation   Inhale 2 puffs into the lungs every 6 (six) hours as needed for wheezing.         Marland Kitchen apixaban (ELIQUIS) 5 MG TABS tablet   Oral   Take 5 mg by mouth 2 (two) times daily.         Marland Kitchen aspirin 81 MG tablet   Oral   Take 81 mg by mouth daily.           Marland Kitchen  atorvastatin (LIPITOR) 40 MG tablet      1 by mouth at bedtime         . budesonide-formoterol (SYMBICORT) 160-4.5 MCG/ACT inhaler   Inhalation   Inhale 2 puffs into the lungs 2 (two) times daily.   1 Inhaler   12   . diltiazem (CARDIZEM SR) 90 MG 12 hr capsule   Oral   Take 2 capsules (180 mg total) by mouth every 12 (twelve) hours.   120 capsule   6   . dronedarone (MULTAQ) 400 MG tablet   Oral   Take 1 tablet (400 mg total) by mouth 2 (two) times daily with a meal.   60 tablet   6   . finasteride (PROSCAR) 5 MG tablet   Oral   Take 1 tablet (5 mg total) by mouth daily.    30 tablet   6   . isosorbide mononitrate (IMDUR) 15 mg TB24   Oral   Take 0.5 tablets (15 mg total) by mouth daily.   30 tablet   6   . Multiple Vitamins-Minerals (MULTIVITAMIN WITH MINERALS) tablet   Oral   Take 1 tablet by mouth daily.           . nebivolol (BYSTOLIC) 10 MG tablet      Take two tablets daily.         . pantoprazole (PROTONIX) 40 MG tablet   Oral   Take 40 mg by mouth daily.           . predniSONE (DELTASONE) 10 MG tablet      Take 2 tabs daily for 3 days, then 1 tab daily for 3 days and stop   9 tablet   0   . tamsulosin (FLOMAX) 0.4 MG CAPS   Oral   Take 1 capsule (0.4 mg total) by mouth daily.   30 capsule   6   . tiotropium (SPIRIVA) 18 MCG inhalation capsule   Inhalation   Place 18 mcg into inhaler and inhale daily.           Marland Kitchen EXPIRED: albuterol (PROVENTIL HFA;VENTOLIN HFA) 108 (90 BASE) MCG/ACT inhaler   Inhalation   Inhale 2 puffs into the lungs every 6 (six) hours as needed.          BP 114/72  Pulse 99  Temp(Src) 98.3 F (36.8 C) (Oral)  Resp 23  Ht 5\' 11"  (1.803 m)  Wt 195 lb (88.451 kg)  BMI 27.21 kg/m2  SpO2 97%  Physical Exam  Nursing note and vitals reviewed. Constitutional: He is oriented to person, place, and time. He appears well-developed and well-nourished. No distress.  HENT:  Head: Normocephalic and atraumatic.  Mouth/Throat: Oropharynx is clear and moist. No oropharyngeal exudate.  Eyes: Conjunctivae and EOM are normal. Pupils are equal, round, and reactive to light. No scleral icterus.  Neck: Normal range of motion.  Cardiovascular: Intact distal pulses.   Irregularly irregular rhythm with tachy rate between 116-140  Pulmonary/Chest: Effort normal and breath sounds normal. No respiratory distress. He has no wheezes. He has no rales.  Patient tachypneic to 27  Abdominal: Soft. There is no tenderness. There is no rebound.  Musculoskeletal: Normal range of motion.  Neurological: He is alert and oriented  to person, place, and time.  Skin: Skin is warm and dry. No rash noted. He is not diaphoretic. No erythema. No pallor.  Psychiatric: He has a normal mood and affect. His behavior is normal.   ED Course  Procedures (including  critical care time) Labs Reviewed  CBC WITH DIFFERENTIAL - Abnormal; Notable for the following:    WBC 11.7 (*)    RBC 4.00 (*)    Hemoglobin 12.3 (*)    HCT 36.2 (*)    Neutro Abs 8.7 (*)    All other components within normal limits  BASIC METABOLIC PANEL - Abnormal; Notable for the following:    BUN 26 (*)    Creatinine, Ser 1.47 (*)    GFR calc non Af Amer 46 (*)    GFR calc Af Amer 54 (*)    All other components within normal limits  TROPONIN I  POCT I-STAT TROPONIN I    Date: 08/16/2012  Rate: 139  Rhythm: atrial fibrillation  QRS Axis: right  Intervals: normal  ST/T Wave abnormalities: nonspecific ST changes  Conduction Disutrbances:none  Narrative Interpretation: A fib with rapid ventricular response; no STEMI  Old EKG Reviewed: unchanged from 08/16/12 (EKG done at PCP PTA) I have reviewed and interpreted this EKG  Dg Chest Port 1 View  08/16/2012   *RADIOLOGY REPORT*  Clinical Data: Short of breath  PORTABLE CHEST - 1 VIEW  Comparison: 08/03/2012  Findings: COPD with hyperinflation of the lungs.  Negative for pneumonia.  Negative for heart failure or effusion.  IMPRESSION: No acute abnormality.  COPD.   Original Report Authenticated By: Janeece Riggers, M.D.    1. Atrial fibrillation with RVR   2. COPD (chronic obstructive pulmonary disease)   3. Smoker     MDM  Patient with a history of atrial fibrillation presents for atrial fibrillation with rapid ventricular response. Patient asymptomatic on arrival, though appears tachypneic without dyspnea. Followed by Dr. Tresa Endo of [facility] cardiovascular. I spoke with Franky Macho, a cardiologist on call for [facility] who will come evaluate the patient. CBC, BMP, troponin, and portable CXR ordered. Cardizem  bolus and infusion ordered for rate control.  Heart rate down to ~100 and patient hemodynamically stable. Patient awaiting evaluation by Dr. Tresa Endo. Note from [place], PA-C of practice appears to indicate reattempt at cardioversion today or tomorrow. Anticipate admission. Will continue to monitor.   Filed Vitals:   08/16/12 1500 08/16/12 1515 08/16/12 1530 08/16/12 1545  BP: 135/99 152/77 148/92 114/72  Pulse: 105 95 101 99  Temp:      TempSrc:      Resp: 27 32 30 23  Height:      Weight:      SpO2: 97% 97% 98% 97%      Antony Madura, PA-C 08/16/12 1613

## 2012-08-16 NOTE — Consult Note (Signed)
Reason for Consult: Atrial Fibrillation with RVR Referring Physician: [place] physician  HPI: The patient is a 72 y/o male, with known atrial fibrillation, followed by Dr. Tresa Endo, who was advised by his pulmonologist today to report to the ER, after an EKG demonstrated A-fib with RVR. Apparently on 06/07/2012 he underwent cardioversion and required countershock x 2 with restoration to sinus rhythm. He did have frequent PACs initially following cardioversion and this stabilized with IV Lopressor 2.5 mg. When Dr. Tresa Endo saw him for follow-up on 06/29/2012,  he apparently was unaware he had reverted back into atrial fibrillation. Due to his COPD Dr. Tresa Endo was concerned about initiating amiodarone and ultimately started him on multaq 400 mg twice a day. He titrated his Bystolic to 15 mg. Dr. Tresa Endo reduced his Cardizem from 180-120 mg. The plan was to have to patient undergo a second attempt at DCCV, however this was recently canceled when the patient was hospitalized from 6/28-7/9 for a COPD exacerbation. The patient reports that he has competed his course of antibiotics.  In the ER, an EKG confirms a-fib with a ventricular rate of 131 bpm. His BP is stable. He is completely unaware of his arrythmia. He denies palpitations, chest discomfort, SOB, lightheadedness. Syncope/presyncope. He reports compliance with all of his medications.  Other medical history is significant for CAD, s/p PCI to RCA in 2003, infrarenal 3.2 x 3.05 AAA, continued tobacco smoking (1ppd), hypertension, hyperlipidemia, as well as sleep apnea. He has been compliant with CPAP.    Past Medical History  Diagnosis Date  . COPD (chronic obstructive pulmonary disease)   . Active smoker   . Sleep apnea     managed by Dr. Renne Crigler  . Obesity   . Coronary artery disease   . Benign neoplasm of colon   . Atrial fibrillation   . Pyelonephritis, unspecified   . Other and unspecified hyperlipidemia   . Personal history of peptic ulcer disease   .  Elevated prostate specific antigen (PSA)   . Diaphragmatic hernia without mention of obstruction or gangrene   . Esophageal reflux   . Tobacco use disorder   . Unspecified essential hypertension   . Peripheral vascular disease     Past Surgical History  Procedure Laterality Date  . Coronary artery stent  2003  . Cardioversion N/A 06/07/2012    Procedure: CARDIOVERSION;  Surgeon: Lennette Bihari, MD;  Location[facility] ENDOSCOPY;  Service: Cardiovascular;  Laterality: N/A;    Family History  Problem Relation Age of Onset  . Heart disease Mother   . Coronary artery disease Father   . Emphysema Father     Social History:  reports that he has been smoking Cigarettes.  He has a 55 pack-year smoking history. He has never used smokeless tobacco. He reports that he drinks about 1.8 ounces of alcohol per week. He reports that he does not use illicit drugs.  Allergies:  Allergies  Allergen Reactions  . Ranitidine Hcl     REACTION: throat swelling, difficulty breathing  . Beta Adrenergic Blockers     REACTION: sexual side effects  . Ramipril     REACTION: throat swelling  . Sulfonamide Derivatives     REACTION: childhood reation of hematuria  . Ticlopidine Hcl     REACTION: swelling    Medications:  Prior to Admission medications   Medication Sig Start Date End Date Taking? Authorizing Provider  albuterol (PROVENTIL HFA;VENTOLIN HFA) 108 (90 BASE) MCG/ACT inhaler Inhale 2 puffs into the lungs every 6 (  six) hours as needed for wheezing.   Yes Historical Provider, MD  apixaban (ELIQUIS) 5 MG TABS tablet Take 5 mg by mouth 2 (two) times daily.   Yes Historical Provider, MD  aspirin 81 MG tablet Take 81 mg by mouth daily.     Yes Historical Provider, MD  atorvastatin (LIPITOR) 40 MG tablet 1 by mouth at bedtime 06/20/11  Yes Historical Provider, MD  budesonide-formoterol (SYMBICORT) 160-4.5 MCG/ACT inhaler Inhale 2 puffs into the lungs 2 (two) times daily. 08/08/12 10/23/14 Yes Simonne Martinet, NP   diltiazem (CARDIZEM SR) 90 MG 12 hr capsule Take 2 capsules (180 mg total) by mouth every 12 (twelve) hours. 08/08/12  Yes Simonne Martinet, NP  dronedarone (MULTAQ) 400 MG tablet Take 1 tablet (400 mg total) by mouth 2 (two) times daily with a meal. 06/29/12  Yes Lennette Bihari, MD  finasteride (PROSCAR) 5 MG tablet Take 1 tablet (5 mg total) by mouth daily. 08/08/12  Yes Simonne Martinet, NP  isosorbide mononitrate (IMDUR) 15 mg TB24 Take 0.5 tablets (15 mg total) by mouth daily. 08/08/12  Yes Simonne Martinet, NP  Multiple Vitamins-Minerals (MULTIVITAMIN WITH MINERALS) tablet Take 1 tablet by mouth daily.     Yes Historical Provider, MD  nebivolol (BYSTOLIC) 10 MG tablet Take two tablets daily.   Yes Historical Provider, MD  pantoprazole (PROTONIX) 40 MG tablet Take 40 mg by mouth daily.     Yes Historical Provider, MD  predniSONE (DELTASONE) 10 MG tablet Take 2 tabs daily for 3 days, then 1 tab daily for 3 days and stop 08/08/12  Yes Simonne Martinet, NP  tamsulosin (FLOMAX) 0.4 MG CAPS Take 1 capsule (0.4 mg total) by mouth daily. 08/08/12  Yes Simonne Martinet, NP  tiotropium (SPIRIVA) 18 MCG inhalation capsule Place 18 mcg into inhaler and inhale daily.     Yes Historical Provider, MD  albuterol (PROVENTIL HFA;VENTOLIN HFA) 108 (90 BASE) MCG/ACT inhaler Inhale 2 puffs into the lungs every 6 (six) hours as needed. 04/26/10 07/10/12  Barbaraann Share, MD     Results for orders placed during the hospital encounter of 08/16/12 (from the past 48 hour(s))  CBC WITH DIFFERENTIAL     Status: Abnormal   Collection Time    08/16/12 11:34 AM      Result Value Range   WBC 11.7 (*) 4.0 - 10.5 K/uL   RBC 4.00 (*) 4.22 - 5.81 MIL/uL   Hemoglobin 12.3 (*) 13.0 - 17.0 g/dL   HCT 40.9 (*) 81.1 - 91.4 %   MCV 90.5  78.0 - 100.0 fL   MCH 30.8  26.0 - 34.0 pg   MCHC 34.0  30.0 - 36.0 g/dL   RDW 78.2  95.6 - 21.3 %   Platelets 189  150 - 400 K/uL   Neutrophils Relative % 75  43 - 77 %   Neutro Abs 8.7 (*) 1.7 - 7.7 K/uL    Lymphocytes Relative 15  12 - 46 %   Lymphs Abs 1.8  0.7 - 4.0 K/uL   Monocytes Relative 9  3 - 12 %   Monocytes Absolute 1.0  0.1 - 1.0 K/uL   Eosinophils Relative 1  0 - 5 %   Eosinophils Absolute 0.1  0.0 - 0.7 K/uL   Basophils Relative 0  0 - 1 %   Basophils Absolute 0.0  0.0 - 0.1 K/uL  POCT I-STAT TROPONIN I     Status: None   Collection Time  08/16/12 11:56 AM      Result Value Range   Troponin i, poc 0.03  0.00 - 0.08 ng/mL   Comment 3            Comment: Due to the release kinetics of cTnI,     a negative result within the first hours     of the onset of symptoms does not rule out     myocardial infarction with certainty.     If myocardial infarction is still suspected,     repeat the test at appropriate intervals.    No results found.  Review of Systems  Constitutional: Negative for malaise/fatigue.  Respiratory: Negative for shortness of breath.   Cardiovascular: Positive for leg swelling. Negative for chest pain and palpitations.  Neurological: Negative for dizziness and loss of consciousness.  All other systems reviewed and are negative.   Blood pressure 128/86, pulse 66, temperature 98.3 F (36.8 C), temperature source Oral, resp. rate 28, height 5\' 11"  (1.803 m), weight 195 lb (88.451 kg), SpO2 97.00%. Physical Exam  Constitutional: He is oriented to person, place, and time. He appears well-developed and well-nourished. No distress.  HENT:  Head: Normocephalic and atraumatic.  Neck: No JVD present. Carotid bruit is not present.  Cardiovascular: An irregularly irregular rhythm present. Exam reveals no gallop and no friction rub.   No murmur heard. Pulses:      Radial pulses are 2+ on the right side, and 2+ on the left side.       Dorsalis pedis pulses are 2+ on the right side, and 2+ on the left side.  Respiratory: No respiratory distress. He has no wheezes. He has no rales.  GI: Soft. Bowel sounds are normal. He exhibits no distension and no mass.  There is no tenderness.  Musculoskeletal: He exhibits edema (2+ bilateral pedal edema).  Neurological: He is alert and oriented to person, place, and time.  Skin: Skin is warm and dry. He is not diaphoretic.  Psychiatric: He has a normal mood and affect. His behavior is normal.    Assessment/Plan: Principal Problem:   Atrial fibrillation with RVR Active Problems:   ATHEROSCLEROTIC CARDIOVASCULAR DISEASE   COPD (chronic obstructive pulmonary disease)   Atrial fibrillation, persistent   Chronic anticoagulation - Eliquis  Plan: Pt with known A-fib, presents with A-fib with RVR.  Ventricular rates were intially in the 130s. He is currently on IV Diltiazem at a rate of 10 mg/hr. His rates have improved to the low 100s.  He remains hemodynamically stable, with most recent BP of 128/86. He is completely asymptomatic. Plan to reattempt DDCV. He has been on Eliquis for [facility]. ? DCCV later today vs tomorrow. MD to follow.    Allayne Butcher, PA-C 08/16/2012, 12:20 PM    Patient seen and examined. Agree with assessment and plan. Pt well known to me. He has a longstanding tobacco history , COPD, known CAD, and OSA on CPAP. He has been on eliquis for at least 3-4 months. He had undergone DC cardioversion in May 2014, but developed recurrent AF. Multaq was subsequently started due to concern of amiodarone with his significant COPD. He was to have an outpatient cardioversion today but this was cancelled due to his recent hospitalization with COPD exacerbation. When he saw pulmonary today in office HR was ~150 and I was notified and advised hospitalization. In ER rate of AF now ~95-100 on IV cardizem drip. Lungs with diffuse rhochi. No chest pain. CXR with COPD without acute  abnormality. check BNP. Consider cardioversion tomorrow since he had eaten earlier today. CPAP tonight with his OSA. If he fails cardioversion, consider evaluation for AF ablation vs rate control alone for AF vs another  antiarrythmic.Marland Kitchen   Lennette Bihari, MD, [facility] 08/16/2012 3:23 PM

## 2012-08-16 NOTE — ED Notes (Signed)
Pt was at Dr appt where they did and EKG and heart rate was elevated. Dr office called Dr Tresa Endo and was told to send pt to ED someone will meet him here. Pt with labored breathing.

## 2012-08-17 ENCOUNTER — Encounter (HOSPITAL_COMMUNITY): Payer: Self-pay | Admitting: Certified Registered"

## 2012-08-17 ENCOUNTER — Encounter (HOSPITAL_COMMUNITY)
Admission: EM | Disposition: A | Discharge status: Home / Self Care | Payer: Self-pay | Source: Home / Self Care | Attending: Cardiovascular Disease

## 2012-08-17 ENCOUNTER — Observation Stay (HOSPITAL_COMMUNITY): Payer: Medicare Other | Admitting: Anesthesiology

## 2012-08-17 ENCOUNTER — Encounter (HOSPITAL_COMMUNITY): Payer: Self-pay | Admitting: Anesthesiology

## 2012-08-17 DIAGNOSIS — I2510 Atherosclerotic heart disease of native coronary artery without angina pectoris: Secondary | ICD-10-CM

## 2012-08-17 DIAGNOSIS — N184 Chronic kidney disease, stage 4 (severe): Secondary | ICD-10-CM

## 2012-08-17 HISTORY — PX: CARDIOVERSION: SHX1299

## 2012-08-17 LAB — CBC
HCT: 33.9 % — ABNORMAL LOW (ref 39.0–52.0)
Hemoglobin: 11 g/dL — ABNORMAL LOW (ref 13.0–17.0)
RBC: 3.7 MIL/uL — ABNORMAL LOW (ref 4.22–5.81)
RDW: 14 % (ref 11.5–15.5)
WBC: 7.9 10*3/uL (ref 4.0–10.5)

## 2012-08-17 LAB — BASIC METABOLIC PANEL
BUN: 21 mg/dL (ref 6–23)
CO2: 32 mEq/L (ref 19–32)
Chloride: 106 mEq/L (ref 96–112)
GFR calc Af Amer: 62 mL/min — ABNORMAL LOW (ref >90)
Potassium: 4.2 mEq/L (ref 3.5–5.1)

## 2012-08-17 SURGERY — CARDIOVERSION
Anesthesia: General

## 2012-08-17 MED ORDER — DILTIAZEM HCL ER COATED BEADS 120 MG PO TB24
240.0000 mg | ORAL_TABLET | Freq: Every day | ORAL | Status: DC
  Filled 2012-08-17: qty 2

## 2012-08-17 MED ORDER — PROPOFOL 10 MG/ML IV BOLUS
INTRAVENOUS | Status: DC | PRN
  Administered 2012-08-17: 40 mg via INTRAVENOUS

## 2012-08-17 MED ORDER — SODIUM CHLORIDE 0.9 % IV SOLN: INTRAVENOUS | Status: DC

## 2012-08-17 MED ORDER — LIDOCAINE HCL (CARDIAC) 20 MG/ML IV SOLN
INTRAVENOUS | Status: DC | PRN
  Administered 2012-08-17: 40 mg via INTRAVENOUS

## 2012-08-17 MED ORDER — DILTIAZEM HCL ER COATED BEADS 240 MG PO CP24
240.0000 mg | ORAL_CAPSULE | Freq: Every day | ORAL | Status: DC
  Administered 2012-08-17 – 2012-08-18 (×2): 240 mg via ORAL
  Filled 2012-08-17 (×2): qty 1

## 2012-08-17 NOTE — Anesthesia Preprocedure Evaluation (Addendum)
Anesthesia Evaluation  Patient identified by MRN, date of birth, ID band Patient awake    Reviewed: Allergy & Precautions, H&P , NPO status , Patient's Chart, lab work & pertinent test results, reviewed documented beta blocker date and time   Airway       Dental   Pulmonary sleep apnea , COPD COPD inhaler,          Cardiovascular hypertension, + CAD, + Cardiac Stents and + Peripheral Vascular Disease + dysrhythmias Atrial Fibrillation     Neuro/Psych    GI/Hepatic GERD-  Medicated,  Endo/Other    Renal/GU Renal InsufficiencyRenal disease     Musculoskeletal   Abdominal   Peds  Hematology   Anesthesia Other Findings   Reproductive/Obstetrics                          Anesthesia Physical Anesthesia Plan  ASA: III  Anesthesia Plan: General   Post-op Pain Management:    Induction: Intravenous  Airway Management Planned: Mask  Additional Equipment:   Intra-op Plan:   Post-operative Plan:   Informed Consent: I have reviewed the patients History and Physical, chart, labs and discussed the procedure including the risks, benefits and alternatives for the proposed anesthesia with the patient or authorized representative who has indicated his/her understanding and acceptance.     Plan Discussed with: CRNA, Anesthesiologist and Surgeon  Anesthesia Plan Comments:         Anesthesia Quick Evaluation

## 2012-08-17 NOTE — Progress Notes (Signed)
Pt. Seen and examined. Agree with the NP/PA-C note as written. Rate is controlled, still in a-fib, electrolytes WNL.  Discussed risk and benefit of cardioversion, he wishes to proceed. Plan bedside cardioversion at 11:00 today.  Chrystie Nose, MD, [facility] Attending Cardiologist [facility]

## 2012-08-17 NOTE — Anesthesia Postprocedure Evaluation (Signed)
  Anesthesia Post-op Note  Patient: [name]  Procedure(s) Performed: Procedure(s): CARDIOVERSION (N/A)  Patient Location: ICU  Anesthesia Type:General  Level of Consciousness: awake, alert , sedated and patient cooperative  Airway and Oxygen Therapy: Patient Spontanous Breathing  Post-op Pain: none  Post-op Assessment: Post-op Vital signs reviewed, Patient's Cardiovascular Status Stable, Respiratory Function Stable, Patent Airway, No signs of Nausea or vomiting and Pain level controlled  Post-op Vital Signs: stable  Complications: No apparent anesthesia complications

## 2012-08-17 NOTE — Progress Notes (Signed)
[facility]  Patient Status: Active (receiving services up to time of hospitalization)  [facility] is providing the following services: RN and PT  If patient discharges after hours, please call [phone].   Wynelle Bourgeois 08/17/2012, 9:43 AM

## 2012-08-17 NOTE — H&P (Signed)
   INTERVAL PROCEDURE H&P  History and Physical Interval Note:  08/17/2012 8:24 AM  [name] has presented today for their planned procedure. The various methods of treatment have been discussed with the patient and family. After consideration of risks, benefits and other options for treatment, the patient has consented to the procedure.  The patients' outpatient history has been reviewed, patient examined, and no change in status from most recent office note within the past 30 days. I have reviewed the patients' chart and labs and will proceed as planned. Questions were answered to the patient's satisfaction.   Joseph Nose, MD, [facility] Attending Cardiologist [facility]  [name] 08/17/2012, 8:24 AM

## 2012-08-17 NOTE — Transfer of Care (Signed)
Immediate Anesthesia Transfer of Care Note  Patient: [name]  Procedure(s) Performed: Procedure(s): CARDIOVERSION (N/A)  Patient Location: Nursing Unit  Anesthesia Type:General  Level of Consciousness: awake, alert , oriented and patient cooperative  Airway & Oxygen Therapy: Patient Spontanous Breathing and Patient connected to nasal cannula oxygen  Post-op Assessment: Report given to PACU RN, Post -op Vital signs reviewed and stable and Patient moving all extremities  Post vital signs: Reviewed and stable  Complications: No apparent anesthesia complications

## 2012-08-17 NOTE — Preoperative (Signed)
Beta Blockers   Reason not to administer Beta Blockers:Not Applicable 

## 2012-08-17 NOTE — CV Procedure (Addendum)
  CARDIOVERSION NOTE  Procedure: Electrical Cardioversion Indications:  Atrial Fibrillation  Procedure Details:  Consent: Risks of procedure as well as the alternatives and risks of each were explained to the (patient/caregiver).  Consent for procedure obtained.  Time Out: Verified patient identification, verified procedure, site/side was marked, verified correct patient position, special equipment/implants available, medications/allergies/relevent history reviewed, required imaging and test results available.  Performed  Patient placed on cardiac monitor, pulse oximetry, supplemental oxygen as necessary.  Sedation given: Propofol per anesthesia Pacer pads placed anterior and posterior chest.  Cardioverted 2 time(s).  Cardioverted at 150J and 200J biphasic.  Impression: Findings: Post procedure EKG shows: Atrial Fibrillation Complications: None Patient did tolerate procedure well.  Unsuccessful cardioversion despite being on Multaq therapy.  Consider discontinuing Multaq, other anti-arrhythmics or referral to EP for ablation evaluation if warranted.  Will switch to po cardizem today, possible d/c tomorrow if rate is stable.  Time Spent Directly with the Patient:  15 minutes   Chrystie Nose, MD, [facility] Attending Cardiologist [facility]  Muaaz Brau C 08/17/2012, 11:19 AM

## 2012-08-17 NOTE — Addendum Note (Signed)
Addendum created 08/17/12 1220 by Jerilee Hoh, CRNA   Modules edited: Anesthesia Events

## 2012-08-17 NOTE — Progress Notes (Signed)
Subjective:  No complaints  Objective:  Vital Signs in the last 24 hours: Temp:  [97.8 F (36.6 C)-98.8 F (37.1 C)] 98.8 F (37.1 C) (07/18 0743) Pulse Rate:  [47-140] 66 (07/18 0743) Resp:  [17-33] 20 (07/18 0743) BP: (87-162)/(45-109) 114/58 mmHg (07/18 0743) SpO2:  [90 %-100 %] 100 % (07/18 0743) FiO2 (%):  [0.3 %] 0.3 % (07/17 0938) Weight:  [88.451 kg (195 lb)-88.542 kg (195 lb 3.2 oz)] 88.451 kg (195 lb) (07/17 1117)  Intake/Output from previous day:  Intake/Output Summary (Last 24 hours) at 08/17/12 0832 Last data filed at 08/17/12 0744  Gross per 24 hour  Intake 119.62 ml  Output    500 ml  Net -380.38 ml    Physical Exam: General appearance: alert, cooperative, no distress and moderately obese Lungs: decreased breath sounds Heart: irregularly irregular rhythm   Rate: 70  Rhythm: atrial fibrillation  Lab Results:  Recent Labs  08/16/12 1134 08/17/12 0420  WBC 11.7* 7.9  HGB 12.3* 11.0*  PLT 189 158    Recent Labs  08/16/12 1134 08/17/12 0420  NA 141 141  K 4.6 4.2  CL 105 106  CO2 25 32  GLUCOSE 98 92  BUN 26* 21  CREATININE 1.47* 1.30    Recent Labs  08/16/12 1134  TROPONINI <0.30    Imaging: Imaging results have been reviewed  Cardiac Studies: EF 55-60 by 2D April 2014- Myoview low risk April 2012  Assessment/Plan:   Principal Problem:   Atrial fibrillation with RVR- now for DCCV on Multaq  Active Problems:   CAD- RCA PCI '03   COPD - just discharged 7/9 after adm for exacerbation   Chronic anticoagulation - Eliquis   HYPERTENSION   Sleep apnea, obstructive   Smoker   Chronic renal insuff. Stage 3    PLAN: Multaq added. For DCCV today, he is on Eloquis.   Corine Shelter PA-C Bee[facility] [phone] 08/17/2012, 8:32 AM

## 2012-08-18 DIAGNOSIS — G4733 Obstructive sleep apnea (adult) (pediatric): Secondary | ICD-10-CM

## 2012-08-18 DIAGNOSIS — Z7901 Long term (current) use of anticoagulants: Secondary | ICD-10-CM

## 2012-08-18 LAB — BASIC METABOLIC PANEL
CO2: 29 mEq/L (ref 19–32)
Chloride: 106 mEq/L (ref 96–112)
Creatinine, Ser: 1.3 mg/dL (ref 0.50–1.35)
Sodium: 143 mEq/L (ref 135–145)

## 2012-08-18 LAB — CBC
Hemoglobin: 10.7 g/dL — ABNORMAL LOW (ref 13.0–17.0)
MCV: 91.5 fL (ref 78.0–100.0)
Platelets: 140 10*3/uL — ABNORMAL LOW (ref 150–400)
RBC: 3.65 MIL/uL — ABNORMAL LOW (ref 4.22–5.81)
WBC: 7.4 10*3/uL (ref 4.0–10.5)

## 2012-08-18 MED ORDER — DILTIAZEM HCL ER COATED BEADS 240 MG PO CP24: 240.0000 mg | ORAL_CAPSULE | Freq: Every day | ORAL | Status: DC

## 2012-08-18 NOTE — Progress Notes (Signed)
Orders received for pt d/c. All education giving via teachback method. Pt verbalized and taught back all information given. IVs removed. Pt is on Home O2 at times, Pt verbalized that he did not need oxygen to travel home. Pt was taken to car by CNA and assisted into the vehicle.

## 2012-08-18 NOTE — Progress Notes (Addendum)
Subjective: No dizziness, SOB, orthopnea, PND  Objective: Vital signs in last 24 hours: Temp:  [97.7 F (36.5 C)-98.8 F (37.1 C)] 97.7 F (36.5 C) (07/19 0300) Pulse Rate:  [56-115] 78 (07/19 0200) Resp:  [15-30] 16 (07/19 0200) BP: (91-169)/(45-88) 154/77 mmHg (07/19 0200) SpO2:  [95 %-100 %] 98 % (07/19 0200)    Intake/Output from previous day: 07/18 0701 - 07/19 0700 In: 1160 [P.O.:990; I.V.:170] Out: 2580 [Urine:2580] Intake/Output this shift:    Medications Current Facility-Administered Medications  Medication Dose Route Frequency Provider Last Rate Last Dose  . 0.9 %  sodium chloride infusion  250 mL Intravenous Continuous Brittainy Simmons, PA-C 1 mL/hr at 08/16/12 2101 250 mL at 08/16/12 2101  . 0.9 %  sodium chloride infusion   Intravenous Continuous Lennette Bihari, MD 10 mL/hr at 08/17/12 0800    . albuterol (PROVENTIL HFA;VENTOLIN HFA) 108 (90 BASE) MCG/ACT inhaler 2 puff  2 puff Inhalation Q6H PRN Brittainy Simmons, PA-C      . apixaban (ELIQUIS) tablet 5 mg  5 mg Oral BID Brittainy Simmons, PA-C   5 mg at 08/17/12 2122  . atorvastatin (LIPITOR) tablet 40 mg  40 mg Oral q1800 Brittainy Simmons, PA-C   40 mg at 08/17/12 1752  . budesonide-formoterol (SYMBICORT) 160-4.5 MCG/ACT inhaler 2 puff  2 puff Inhalation BID Brittainy Simmons, PA-C   2 puff at 08/17/12 1958  . diltiazem (CARDIZEM CD) 24 hr capsule 240 mg  240 mg Oral Daily Lennette Bihari, MD   240 mg at 08/17/12 1458  . finasteride (PROSCAR) tablet 5 mg  5 mg Oral Daily Brittainy Simmons, PA-C   5 mg at 08/17/12 1149  . hydrocortisone cream 1 % 1 application  1 application Topical TID PRN Brittainy Simmons, PA-C      . isosorbide mononitrate (IMDUR) 24 hr tablet 15 mg  15 mg Oral Daily Brittainy Simmons, PA-C   15 mg at 08/17/12 1149  . nebivolol (BYSTOLIC) tablet 10 mg  10 mg Oral Daily Brittainy Simmons, PA-C   10 mg at 08/17/12 1149  . pantoprazole (PROTONIX) EC tablet 40 mg  40 mg Oral Daily Brittainy  Simmons, PA-C   40 mg at 08/17/12 1149  . sodium chloride 0.9 % injection 3 mL  3 mL Intravenous Q12H Brittainy Simmons, PA-C   3 mL at 08/17/12 2122  . sodium chloride 0.9 % injection 3 mL  3 mL Intravenous PRN Brittainy Simmons, PA-C   3 mL at 08/17/12 1151  . tamsulosin (FLOMAX) capsule 0.4 mg  0.4 mg Oral Daily Brittainy Simmons, PA-C   0.4 mg at 08/17/12 1148  . tiotropium (SPIRIVA) inhalation capsule 18 mcg  18 mcg Inhalation Daily Brittainy Simmons, PA-C   18 mcg at 08/17/12 1610    PE: General appearance: alert, cooperative and no distress Lungs: BS Decreased throughout.  No wheeze or rhonchi Heart: irregularly irregular rhythm Extremities: Trace ankle edema Pulses: Radials 2+ and symmetric, 1+ PTs Skin: Warm and dry Neurologic: Grossly normal  Lab Results:   Recent Labs  08/16/12 1134 08/17/12 0420 08/18/12 0500  WBC 11.7* 7.9 7.4  HGB 12.3* 11.0* 10.7*  HCT 36.2* 33.9* 33.4*  PLT 189 158 140*   BMET  Recent Labs  08/16/12 1134 08/17/12 0420 08/18/12 0500  NA 141 141 143  K 4.6 4.2 4.2  CL 105 106 106  CO2 25 32 29  GLUCOSE 98 92 92  BUN 26* 21 20  CREATININE 1.47* 1.30 1.30  CALCIUM  9.3 8.7 8.8    Assessment/Plan   Principal Problem:   Atrial fibrillation with RVR Active Problems:   HYPERTENSION   CAD- RCA PCI '03   COPD (chronic obstructive pulmonary disease)   Sleep apnea, obstructive   Smoker   Chronic anticoagulation - Eliquis   Chronic renal insufficiency, stage III (moderate)   Plan: SP DCCV 08/17/12 which was unsuccessful.  On Multaq.  Now on PO Cardizem CD 240 with good rate control.  DC home tody with follow up with Dr. Tresa Endo.   LOS: 2 days    HAGER, BRYAN 08/18/2012 8:13 AM  I have seen and examined the patient along with Wilburt Finlay, PA.  I have reviewed the chart, notes and new data.  I agree with PA's note.  Key new complaints: feels at baseline, lying supine without dyspnea Key examination changes: good heart rate  control Key new findings / data: remains in AF  PLAN: DC home today. Continue Multaq for now - he may return spontaneously to NSR as lungs improve. If not, this medication serves little purpose. Because of his lung disease, there are few good antiarrhythmic options. Most importantly, it does not appear that he has a lot of AF related symptoms, so aggressive pursuit of NSR does not appear justified. For the same reason, AF ablation seems unnecessary. Mainstay of treatment should be anticoagulation and rate control. If multaq is stopped, diltiazem may need to be increased. Smoking cessation was discussed today in great detail, for over 15 minutes. Overall, more than 30 minutes dedicated to discharge planning and patient education today.  Thurmon Fair, MD, [facility] [phone] 08/18/2012, 8:39 AM

## 2012-08-20 NOTE — Discharge Summary (Signed)
Physician Discharge Summary  Patient ID: [name] MRN: [id number] DOB/AGE: 72-Feb-1942 72 y.o.  Admit date: 08/16/2012 Discharge date: 08/18/2012  Admission Diagnoses:  Atrial fib with RVR  Discharge Diagnoses:  Principal Problem:   Atrial fibrillation with RVR Active Problems:   HYPERTENSION   CAD- RCA PCI '03   COPD (chronic obstructive pulmonary disease)   Sleep apnea, obstructive   Smoker   Chronic anticoagulation - Eliquis   Chronic renal insufficiency, stage III (moderate)   Discharged Condition: Stable   Hospital Course:   The patient is a 72 y/o male, with known atrial fibrillation, followed by Dr. Tresa Endo, who was advised by his pulmonologist to report to the ER, after an EKG demonstrated A-fib with RVR. Apparently on 06/07/2012 he underwent cardioversion and required countershock x 2 with restoration to sinus rhythm. He did have frequent PACs initially following cardioversion and this stabilized with IV Lopressor 2.5 mg. When Dr. Tresa Endo saw him for follow-up on 06/29/2012, he apparently was unaware he had reverted back into atrial fibrillation. Due to his COPD, Dr. Tresa Endo was concerned about initiating amiodarone and ultimately started him on multaq 400 mg twice a day. He titrated his Bystolic to 15 mg. Dr. Tresa Endo reduced his Cardizem from 180-120 mg. The plan was to have to patient undergo a second attempt at DCCV, however, this was recently canceled when the patient was hospitalized from 6/28-7/9 for a COPD exacerbation. The patient reports that he has competed his course of antibiotics. In the ER, an EKG confirmed a-fib with a ventricular rate of 131 bpm. His BP was stable. He was completely unaware of his arrythmia. He denied palpitations, chest discomfort, SOB, lightheadedness. Syncope/presyncope. He reported compliance with all of his medications.   Other medical history is significant for CAD, s/p PCI to RCA in 2003, infrarenal 3.2 x 3.05 AAA, continued tobacco smoking  (1ppd), hypertension, hyperlipidemia, as well as sleep apnea. He has been compliant with CPAP.   The patient was admitted on IV diltiazem 10mg /hr. Eliquis was continued. DCCV on 08/17/12 with Dr. Rennis Golden was unsuccessful.  He was changed to PO cardizem with good rate control. Follow up with Dr. Tresa Endo was arranged. The patient was seen by Dr. Royann Shivers who felt he was stable for DC home.   Consults: None  Significant Diagnostic Studies:   CARDIOVERSION NOTE  Procedure: Electrical Cardioversion  Indications: Atrial Fibrillation  Procedure Details:  Consent: Risks of procedure as well as the alternatives and risks of each were explained to the (patient/caregiver). Consent for procedure obtained.  Time Out: Verified patient identification, verified procedure, site/side was marked, verified correct patient position, special equipment/implants available, medications/allergies/relevent history reviewed, required imaging and test results available. Performed  Patient placed on cardiac monitor, pulse oximetry, supplemental oxygen as necessary.  Sedation given: Propofol per anesthesia  Pacer pads placed anterior and posterior chest.  Cardioverted 2 time(s).  Cardioverted at 150J and 200J biphasic.  Impression:  Findings: Post procedure EKG shows: Atrial Fibrillation  Complications: None  Patient did tolerate procedure well.  Unsuccessful cardioversion despite being on Multaq therapy. Consider discontinuing Multaq, other anti-arrhythmics or referral to EP for ablation evaluation if warranted.  Will switch to po cardizem today, possible d/[name] tomorrow if rate is stable.  Time Spent Directly with the Patient:  15 minutes  Chrystie Nose, MD, [facility]  Attending Cardiologist  [facility]  [name],[name]  08/17/2012, 11:19 AM  BMET    Component Value Date/Time   NA 143 08/18/2012  0500   K 4.2 08/18/2012 0500   CL 106 08/18/2012 0500   CO2 29 08/18/2012 0500   GLUCOSE 92  08/18/2012 0500   BUN 20 08/18/2012 0500   CREATININE 1.30 08/18/2012 0500   CALCIUM 8.8 08/18/2012 0500   GFRNONAA 54* 08/18/2012 0500   GFRAA 62* 08/18/2012 0500    CBC    Component Value Date/Time   WBC 7.4 08/18/2012 0500   RBC 3.65* 08/18/2012 0500   HGB 10.7* 08/18/2012 0500   HCT 33.4* 08/18/2012 0500   PLT 140* 08/18/2012 0500   MCV 91.5 08/18/2012 0500   MCH 29.3 08/18/2012 0500   MCHC 32.0 08/18/2012 0500   RDW 13.9 08/18/2012 0500   LYMPHSABS 1.8 08/16/2012 1134   MONOABS 1.0 08/16/2012 1134   EOSABS 0.1 08/16/2012 1134   BASOSABS 0.0 08/16/2012 1134     Treatments: See above.  Discharge Exam: Blood pressure 111/57, pulse 103, temperature 98.8 F (37.1 [name]), temperature source Oral, resp. rate 15, height 5\' 11"  (1.803 m), weight 195 lb (88.451 kg), SpO2 97.00%.   Disposition: 01-Home or Self Care  Discharge Orders   Future Appointments Provider Department Dept Phone   09/25/2012 10:30 AM Barbaraann Share, MD [facility] [phone]   11/12/2012 9:15 AM Barbaraann Share, MD [facility] [phone]   Future Orders Complete By Expires     Diet - low sodium heart healthy  As directed     Discharge instructions  As directed     Comments:      Follow up will be arranged with Dr. Tresa Endo.  If you become dizzy or lightheaded, with a rapid HR, go to the ER.    Increase activity slowly  As directed         Medication List    STOP taking these medications       diltiazem 90 MG 12 hr capsule  Commonly known as:  CARDIZEM SR     predniSONE 10 MG tablet  Commonly known as:  DELTASONE      TAKE these medications       albuterol 108 (90 BASE) MCG/ACT inhaler  Commonly known as:  PROVENTIL HFA;VENTOLIN HFA  Inhale 2 puffs into the lungs every 6 (six) hours as needed.     albuterol 108 (90 BASE) MCG/ACT inhaler  Commonly known as:  PROVENTIL HFA;VENTOLIN HFA  Inhale 2 puffs into the lungs every 6 (six) hours as needed for wheezing.     aspirin 81 MG tablet   Take 81 mg by mouth daily.     atorvastatin 40 MG tablet  Commonly known as:  LIPITOR  1 by mouth at bedtime     budesonide-formoterol 160-4.5 MCG/ACT inhaler  Commonly known as:  SYMBICORT  Inhale 2 puffs into the lungs 2 (two) times daily.     diltiazem 240 MG 24 hr capsule  Commonly known as:  CARDIZEM CD  Take 1 capsule (240 mg total) by mouth daily.     dronedarone 400 MG tablet  Commonly known as:  MULTAQ  Take 1 tablet (400 mg total) by mouth 2 (two) times daily with a meal.     ELIQUIS 5 MG Tabs tablet  Generic drug:  apixaban  Take 5 mg by mouth 2 (two) times daily.     finasteride 5 MG tablet  Commonly known as:  PROSCAR  Take 1 tablet (5 mg total) by mouth daily.     isosorbide mononitrate 15 mg Tb24  Commonly known as:  IMDUR  Take 0.5 tablets (15 mg total) by mouth daily.     multivitamin with minerals tablet  Take 1 tablet by mouth daily.     nebivolol 10 MG tablet  Commonly known as:  BYSTOLIC  Take two tablets daily.     pantoprazole 40 MG tablet  Commonly known as:  PROTONIX  Take 40 mg by mouth daily.     tamsulosin 0.4 MG Caps  Commonly known as:  FLOMAX  Take 1 capsule (0.4 mg total) by mouth daily.     tiotropium 18 MCG inhalation capsule  Commonly known as:  SPIRIVA  Place 18 mcg into inhaler and inhale daily.           Follow-up Information   Follow up with Lennette Bihari, MD. (Our office scheduler will call you on Monday with the appt date and time.)    Contact information:   [address] [place] [address] [phone]       Signed: Wilburt Finlay 08/20/2012, 11:13 AM

## 2012-08-21 ENCOUNTER — Encounter (HOSPITAL_COMMUNITY): Payer: Self-pay | Admitting: Internal Medicine

## 2012-08-22 ENCOUNTER — Telehealth: Payer: Self-pay | Admitting: Pulmonary Disease

## 2012-08-22 NOTE — Telephone Encounter (Signed)
ok 

## 2012-08-22 NOTE — Telephone Encounter (Signed)
Spoke with Art w Physical Therapy-- States he saw patient 7/14 and at this time patient stated he did not need PT and that he was "fine" Patient was placed on Arts schedule again on 7/22-- At this time Art contacted patient and asked patient had anything happened to him from 7/14 to then, had he been to hospital, had a fall, etc Per Art patient again stated no, that he was fine and did not need PT Art calling our office to let Dr. Vassie Loll know that patient declined PT x2 Nothing further needed from Art,will route to Dr. Vassie Loll as FYI  Looking in Epic patient was hospitalized 7/17 w Afib RVR/Cardioversion

## 2012-08-27 ENCOUNTER — Telehealth: Payer: Self-pay | Admitting: Internal Medicine

## 2012-08-31 NOTE — Telephone Encounter (Signed)
Error

## 2012-09-05 ENCOUNTER — Ambulatory Visit (INDEPENDENT_AMBULATORY_CARE_PROVIDER_SITE_OTHER): Payer: Medicare Other | Admitting: Cardiovascular Disease

## 2012-09-05 ENCOUNTER — Encounter: Payer: Self-pay | Admitting: Cardiovascular Disease

## 2012-09-05 ENCOUNTER — Other Ambulatory Visit: Payer: Self-pay

## 2012-09-05 VITALS — BP 118/66 | HR 93 | Ht 71.0 in | Wt 196.7 lb

## 2012-09-05 DIAGNOSIS — G4733 Obstructive sleep apnea (adult) (pediatric): Secondary | ICD-10-CM

## 2012-09-05 DIAGNOSIS — I10 Essential (primary) hypertension: Secondary | ICD-10-CM

## 2012-09-05 DIAGNOSIS — I4891 Unspecified atrial fibrillation: Secondary | ICD-10-CM

## 2012-09-05 DIAGNOSIS — Z7901 Long term (current) use of anticoagulants: Secondary | ICD-10-CM

## 2012-09-05 DIAGNOSIS — F17200 Nicotine dependence, unspecified, uncomplicated: Secondary | ICD-10-CM

## 2012-09-05 DIAGNOSIS — E785 Hyperlipidemia, unspecified: Secondary | ICD-10-CM

## 2012-09-05 MED ORDER — HYDROCHLOROTHIAZIDE 12.5 MG PO CAPS: 12.5000 mg | ORAL_CAPSULE | Freq: Every day | ORAL | Status: DC

## 2012-09-05 NOTE — Patient Instructions (Addendum)
Your physician has recommended you make the following change in your medication: STOP TAKING YOUR MULTAQ. Start new prescription written for HCTZ 12.5 MG. This has already been sent to your pharmacy.  Your physician recommends that you schedule a follow-up appointment in: 2 MONTHS.

## 2012-09-09 ENCOUNTER — Encounter: Payer: Self-pay | Admitting: Cardiovascular Disease

## 2012-09-09 DIAGNOSIS — I4821 Permanent atrial fibrillation: Secondary | ICD-10-CM | POA: Insufficient documentation

## 2012-09-09 NOTE — Progress Notes (Signed)
Patient ID: [name], male   DOB: Feb 04, 1940, 71 y.o.   MRN: [id number]     HPI: [name], is a 72 y.o. male who presents for followup evaluation following his recent hospitalization when he presented with atrial fibrillation with rapid ventricular response.  [name] has established coronary artery disease and underwent remote stenting of his right coronary artery in 2003 by Dr. Aleen Campi. I first saw him in 2011 he had symptoms suggestive of CAD progression.  Repeat catheterization was done which showed total occlusion of the RCA within the previously stented segment.  I was able to successfully reopen this vessel and also performed PTCA distal to his previously placed stents. He did have concomitant CAD of the LAD and circumflex vessels. He has a documented infrarenal abdominal aortic aneurysm. He continues to have tobacco use with documented COPD/emphysema, history of hypertension hyperlipidemia and obstructive sleep apnea. He was found to be in atrial fibrillation in March 2014 at that time was started on Eliquis anticoagulation and rate control medications. A cardioversion was performed on 06/07/2012 with restoration of sinus rhythm. Unfortunately, he did not hold and develop recurrent AF. Due to his COPD I was concerned about initiating amiodarone and  then started him on Multaq 400 mg twice a day and further titrated his Bystolic.  He was set up for a an attempt at another cardioversion in July but prior to this was hospitalized with a COPD exacerbation and his elective cardioversion was canceled. However, he then presented to the pulmonology office for followup evaluation and was noted to be in atrial fibrillation with rapid ventricular response which then led to her another [facility] hospitalization. At that time he was then started on IV Cardizem for rate control. While on Multaq, another attempt at cardioversion was done by Dr. Rennis Golden which was unsuccessful and the thought process was for  him to stay in permanent atrial fibrillation. He presents now for followup evaluation.   Past Medical History  Diagnosis Date  . COPD (chronic obstructive pulmonary disease)   . Active smoker   . Sleep apnea     managed by Dr. Renne Crigler  . Obesity   . Coronary artery disease   . Benign neoplasm of colon   . Atrial fibrillation   . Pyelonephritis, unspecified   . Other and unspecified hyperlipidemia   . Personal history of peptic ulcer disease   . Elevated prostate specific antigen (PSA)   . Diaphragmatic hernia without mention of obstruction or gangrene   . Esophageal reflux   . Tobacco use disorder   . Unspecified essential hypertension   . Peripheral vascular disease     Past Surgical History  Procedure Laterality Date  . Coronary artery stent  2003  . Cardioversion N/A 06/07/2012    Procedure: CARDIOVERSION;  Surgeon: Lennette Bihari, MD;  Location: [facility] ENDOSCOPY;  Service: Cardiovascular;  Laterality: N/A;  . Cardioversion N/A 08/17/2012    Procedure: CARDIOVERSION;  Surgeon: Chrystie Nose, MD;  Location: [facility] ENDOSCOPY;  Service: Cardiovascular;  Laterality: N/A;    Allergies  Allergen Reactions  . Ranitidine Hcl     REACTION: throat swelling, difficulty breathing  . Beta Adrenergic Blockers     REACTION: sexual side effects  . Ramipril     REACTION: throat swelling  . Sulfonamide Derivatives     REACTION: childhood reation of hematuria  . Ticlopidine Hcl     REACTION: swelling    Current Outpatient Prescriptions  Medication Sig Dispense Refill  .  albuterol (PROVENTIL HFA;VENTOLIN HFA) 108 (90 BASE) MCG/ACT inhaler Inhale 2 puffs into the lungs every 6 (six) hours as needed for wheezing.      Marland Kitchen apixaban (ELIQUIS) 5 MG TABS tablet Take 5 mg by mouth 2 (two) times daily.      Marland Kitchen aspirin 81 MG tablet Take 81 mg by mouth daily.        Marland Kitchen atorvastatin (LIPITOR) 40 MG tablet 1 by mouth at bedtime      . budesonide-formoterol (SYMBICORT) 160-4.5 MCG/ACT inhaler Inhale 2 puffs  into the lungs 2 (two) times daily.  1 Inhaler  12  . diltiazem (CARDIZEM CD) 240 MG 24 hr capsule Take 1 capsule (240 mg total) by mouth daily.  30 capsule  5  . dronedarone (MULTAQ) 400 MG tablet Take 1 tablet (400 mg total) by mouth 2 (two) times daily with a meal.  60 tablet  6  . finasteride (PROSCAR) 5 MG tablet Take 1 tablet (5 mg total) by mouth daily.  30 tablet  6  . isosorbide mononitrate (IMDUR) 15 mg TB24 Take 0.5 tablets (15 mg total) by mouth daily.  30 tablet  6  . Multiple Vitamins-Minerals (MULTIVITAMIN WITH MINERALS) tablet Take 1 tablet by mouth daily.        . nebivolol (BYSTOLIC) 10 MG tablet Take two tablets daily.      . pantoprazole (PROTONIX) 40 MG tablet Take 40 mg by mouth daily.        . tamsulosin (FLOMAX) 0.4 MG CAPS Take 1 capsule (0.4 mg total) by mouth daily.  30 capsule  6  . tiotropium (SPIRIVA) 18 MCG inhalation capsule Place 18 mcg into inhaler and inhale daily.        . hydrochlorothiazide (MICROZIDE) 12.5 MG capsule Take 1 capsule (12.5 mg total) by mouth daily.  90 capsule  3   No current facility-administered medications for this visit.    History   Social History  . Marital Status: Married    Spouse Name: N/A    Number of Children: 2  . Years of Education: N/A   Occupational History  . 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 Smoking status: Current Every Day Smoker -- 1.00 packs/day for 55 years    Types: Cigarettes  . Smokeless tobacco: Never Used     Comment: started at age 44.  prev was 2 ppd.   . Alcohol Use: 1.8 oz/week    3 Shots of liquor per week  . Drug Use: No  . Sexually Active: Not on file   Other Topics Concern  . Not on file   Social History Narrative  . No narrative on file    ROS is negative for fevers, chills or night sweats.  He denies any awareness of increased heart rate and is unaware that his heart rhythm is irregular. He denies bleeding. He denies chest pressure. He still smokes  cigarettes. He denies bleeding. There is trace leg swelling. Other system review is negative.  PE BP 118/66  Pulse 93  Ht 5\' 11"  (1.803 m)  Wt 196 lb 11.2 oz (89.223 kg)  BMI 27.45 kg/m2  SpO2 91%  General: Alert, oriented, no distress.  Skin: normal turgor, no rashes HEENT: Normocephalic, atraumatic. Pupils round and reactive; sclera anicteric;no lid lag.  Nose without nasal septal hypertrophy Mouth/Parynx benign; Mallinpatti scale 3 Neck: No JVD, no carotid briuts Lungs: clear to ausculatation and percussion; no wheezing or rales Heart:  Irregularly irregular with a ventricular rate in the 60s, s1 s2 normal  Abdomen: soft, nontender; no hepatosplenomehaly, BS+; abdominal aorta nontender and not dilated by palpation. Pulses 2+ Extremities: no clubbing cyanosis or edema, Homan's sign negative  Neurologic: grossly nonfocal  ECG: Atrial fibrillation at 63 beats per minute with nonspecific ST changes.  LABS:  BMET    Component Value Date/Time   NA 143 08/18/2012 0500   K 4.2 08/18/2012 0500   CL 106 08/18/2012 0500   CO2 29 08/18/2012 0500   GLUCOSE 92 08/18/2012 0500   BUN 20 08/18/2012 0500   CREATININE 1.30 08/18/2012 0500   CALCIUM 8.8 08/18/2012 0500   GFRNONAA 54* 08/18/2012 0500   GFRAA 62* 08/18/2012 0500     Hepatic Function Panel     Component Value Date/Time   PROT 6.9 07/28/2012 1244   ALBUMIN 3.2* 07/28/2012 1244   AST 17 07/28/2012 1244   ALT 15 07/28/2012 1244   ALKPHOS 75 07/28/2012 1244   BILITOT 0.3 07/28/2012 1244     CBC    Component Value Date/Time   WBC 7.4 08/18/2012 0500   RBC 3.65* 08/18/2012 0500   HGB 10.7* 08/18/2012 0500   HCT 33.4* 08/18/2012 0500   PLT 140* 08/18/2012 0500   MCV 91.5 08/18/2012 0500   MCH 29.3 08/18/2012 0500   MCHC 32.0 08/18/2012 0500   RDW 13.9 08/18/2012 0500   LYMPHSABS 1.8 08/16/2012 1134   MONOABS 1.0 08/16/2012 1134   EOSABS 0.1 08/16/2012 1134   BASOSABS 0.0 08/16/2012 1134     BNP    Component Value Date/Time    PROBNP 1803.0* 08/16/2012 2115    Lipid Panel  No results found for this basename: chol, trig, hdl, cholhdl, vldl, ldlcalc     RADIOLOGY: Dg Chest Port 1 View  08/16/2012   *RADIOLOGY REPORT*  Clinical Data: Short of breath  PORTABLE CHEST - 1 VIEW  Comparison: 08/03/2012  Findings: COPD with hyperinflation of the lungs.  Negative for pneumonia.  Negative for heart failure or effusion.  IMPRESSION: No acute abnormality.  COPD.   Original Report Authenticated By: Janeece Riggers, M.D.      ASSESSMENT AND PLAN: Mr. Tomaro has significant COPD and continues to smoke cigarettes. He recently has had 2 attempts at cardioversion for restoration of sinus rhythm but continues to be in atrial fibrillation. Apparently, he never did stop his Multaq following his last failed attempt. I recommending he discontinue this therapy today and will maintain him on rate control of his atrial fibrillation with diastolic which currently is at 20 mg daily as well as Cardizem CD 240 mg daily. He is tolerating Eliquis 5 mg twice a day without side effects for anticoagulation. I'm starting him on HCTZ 12.5 mg which will help his leg edema. He is using CPAP therapy. We again discussed the importance of smoking cessation. I will see him in 2 months for followup evaluation.     Lennette Bihari, MD, [facility]  09/09/2012 9:51 AM

## 2012-09-25 ENCOUNTER — Encounter: Payer: Self-pay | Admitting: Pulmonary Disease

## 2012-09-25 ENCOUNTER — Ambulatory Visit (INDEPENDENT_AMBULATORY_CARE_PROVIDER_SITE_OTHER): Payer: Medicare Other | Admitting: Pulmonary Disease

## 2012-09-25 VITALS — HR 88 | Temp 98.6°F | Ht 71.0 in | Wt 196.8 lb

## 2012-09-25 DIAGNOSIS — J449 Chronic obstructive pulmonary disease, unspecified: Secondary | ICD-10-CM

## 2012-09-25 NOTE — Patient Instructions (Addendum)
No change in breathing medications. You must stop smoking.  This is the most important treatment for your lung and heart disease.  Keep track of your heart rate, and let cardiology know if staying elevated.  followup with me in 6mos.

## 2012-09-25 NOTE — Assessment & Plan Note (Signed)
The patient is nearing his usual baseline from a pulmonary standpoint after an admission with congestive heart failure related to atrial fibrillation with a rapid ventricular response.  He has no significant chest congestion, purulence, or increased shortness of breath from baseline.  Unfortunately, he is continuing to smoke, and I have counseled him on smoking cessation.  He is to continue on his current bronchodilator regimen.

## 2012-09-25 NOTE — Progress Notes (Signed)
  Subjective:    Patient ID: [name], male    DOB: 04/09/1940, 72 y.o.   MRN: [id number]  HPI The patient comes in today for followup of his known COPD.  He was in the hospital this summer with worsening shortness of breath, and ultimately found to have pulmonary edema secondary to atrial fibrillation with a rapid ventricular response.  There was no evidence for pneumonia radiographically, and it was unclear how much COPD may have contributed to his breathing issues during that admission.  He is being followed closely by cardiology for rate control, and feels that his breathing is getting back near his usual baseline.  Unfortunately, he continues to smoke, and has morning cough with nonpurulent mucus.   Review of Systems  Constitutional: Negative for fever and unexpected weight change.  HENT: Negative for ear pain, nosebleeds, congestion, sore throat, rhinorrhea, sneezing, trouble swallowing, dental problem, postnasal drip and sinus pressure.   Eyes: Negative for redness and itching.  Respiratory: Negative for cough, chest tightness, shortness of breath and wheezing.   Cardiovascular: Negative for palpitations and leg swelling.  Gastrointestinal: Negative for nausea and vomiting.  Genitourinary: Negative for dysuria.  Musculoskeletal: Negative for joint swelling.  Skin: Negative for rash.  Neurological: Negative for headaches.  Hematological: Does not bruise/bleed easily.  Psychiatric/Behavioral: Negative for dysphoric mood. The patient is not nervous/anxious.        Objective:   Physical Exam Well-developed male in no acute distress Nose without purulence or discharge noted Neck without lymphadenopathy or thyromegaly Chest with decreased breath sounds throughout, no active wheezing Cardiac exam with irregular rhythm, rate 88-100 apically. Lower extremities without edema, no cyanosis Alert and oriented, moves all 4 extremities.       Assessment & Plan:

## 2012-09-27 ENCOUNTER — Telehealth: Payer: Self-pay | Admitting: Pulmonary Disease

## 2012-09-27 NOTE — Telephone Encounter (Signed)
I spoke with Selena Batten. She stated they need a recert for pt education. This on his CHF, COPD, managing his weight. Per Selena Batten her original orders has [facility] signature on it.  Please advise KC thanks

## 2012-09-28 NOTE — Telephone Encounter (Signed)
I have no idea what you are talking about ("pt education"??????)

## 2012-09-28 NOTE — Telephone Encounter (Signed)
It is to show pt proper way to use there medication, show purse lip breathing, when they need to call the doctor if they are having problems, what steps to take when they are in trouble, ETC. Please advise thanks

## 2012-09-28 NOTE — Telephone Encounter (Signed)
lmomtcb x1 for [name] 

## 2012-09-28 NOTE — Telephone Encounter (Signed)
Who is doing all of this, what organization or group?

## 2012-10-02 NOTE — Telephone Encounter (Signed)
This is done through [facility]. Carron Curie, CMA

## 2012-10-02 NOTE — Telephone Encounter (Signed)
No way!!!!! Pt can get this thru pulmonary rehab if he is interested in attending.

## 2012-10-02 NOTE — Telephone Encounter (Signed)
I spoke with the pt about pulmonary rehab and he states he will discuss this with his wife and let is know for sure but that he is interested in this. I advised I will call [facility] to cancel pt education service. I called Kim at [facility] and advised. The pt states he will let us know when he decides to proceed with rehab. Carron Curie, CMA

## 2012-11-02 ENCOUNTER — Encounter: Payer: Self-pay | Admitting: Cardiovascular Disease

## 2012-11-02 ENCOUNTER — Ambulatory Visit (INDEPENDENT_AMBULATORY_CARE_PROVIDER_SITE_OTHER): Payer: Medicare Other | Admitting: Cardiovascular Disease

## 2012-11-02 VITALS — BP 122/80 | HR 109 | Ht 72.0 in | Wt 199.4 lb

## 2012-11-02 DIAGNOSIS — I2510 Atherosclerotic heart disease of native coronary artery without angina pectoris: Secondary | ICD-10-CM

## 2012-11-02 DIAGNOSIS — R609 Edema, unspecified: Secondary | ICD-10-CM

## 2012-11-02 DIAGNOSIS — R600 Localized edema: Secondary | ICD-10-CM | POA: Insufficient documentation

## 2012-11-02 DIAGNOSIS — I10 Essential (primary) hypertension: Secondary | ICD-10-CM

## 2012-11-02 DIAGNOSIS — I4891 Unspecified atrial fibrillation: Secondary | ICD-10-CM

## 2012-11-02 DIAGNOSIS — G4733 Obstructive sleep apnea (adult) (pediatric): Secondary | ICD-10-CM

## 2012-11-02 MED ORDER — NEBIVOLOL HCL 20 MG PO TABS: ORAL_TABLET | ORAL | Status: DC

## 2012-11-02 NOTE — Patient Instructions (Signed)
Your physician has recommended you make the following change in your medication: change your Bystolic to 20mg  in the morning and 10 mg at night. Adjust the HCTZ (HYDROCHLOR 12.5 MG) as directed by Dr. Tresa Endo.  Your physician recommends that you schedule a follow-up appointment in: 4 MONTHS.

## 2012-11-02 NOTE — Progress Notes (Signed)
Patient ID: [name], male   DOB: May 15, 1940, 71 y.o.   MRN: [id number]       HPI: [name], is a 72 y.o. male who presents for followup evaluation following his recent hospitalization when he presented with atrial fibrillation with rapid ventricular response.  [name] has established coronary artery disease and underwent remote stenting of his right coronary artery in 2003 by Dr. Aleen Campi. I first saw him in 2011 he had symptoms suggestive of CAD progression.  Repeat catheterization  showed total occlusion of the RCA within the previously stented segment.  I was able to successfully reopen this vessel and also performed PTCA distal to his previously placed stents. He did have concomitant CAD of the LAD and circumflex vessels. He has a documented infrarenal abdominal aortic aneurysm. He has ongoing tobacco use with documented COPD/emphysema, history of hypertension hyperlipidemia and obstructive sleep apnea. He was found to be in atrial fibrillation in March 2014 at that time was started on Eliquis anticoagulation and rate control medications. A cardioversion was performed on 06/07/2012 with restoration of sinus rhythm. Unfortunately, he did not hold and develop recurrent AF. Due to his COPD I was concerned about initiating amiodarone and  then started him on Multaq 400 mg twice a day and further titrated his Bystolic.  He was set up for a an attempt at another cardioversion in July but prior to this was hospitalized with a COPD exacerbation and his elective cardioversion was canceled. However, he then presented to the pulmonology office for followup evaluation and was noted to be in atrial fibrillation with rapid ventricular response which then led to her another [facility] hospitalization. At that time he was then started on IV Cardizem for rate control. While on Multaq, another attempt at cardioversion was done by Dr. Rennis Golden which was unsuccessful and the thought process was for him to stay in  permanent atrial fibrillation. When I last saw him in the office on 09/05/2012 we elected to keep him in atrial fibrillation. He did have leg edema I started him on HCTZ 12.5 mg. He is unaware of his ventricular rate. He denies chest pain. He states he has significantly reduced his tobacco use and also started using electronic cigarettes. He also has obstructive sleep apnea on CPAP. He presents for evaluation.  Past Medical History  Diagnosis Date  . COPD (chronic obstructive pulmonary disease)   . Active smoker   . Sleep apnea     managed by Dr. Renne Crigler  . Obesity   . Coronary artery disease   . Benign neoplasm of colon   . Atrial fibrillation   . Pyelonephritis, unspecified   . Other and unspecified hyperlipidemia   . Personal history of peptic ulcer disease   . Elevated prostate specific antigen (PSA)   . Diaphragmatic hernia without mention of obstruction or gangrene   . Esophageal reflux   . Tobacco use disorder   . Unspecified essential hypertension   . Peripheral vascular disease     Past Surgical History  Procedure Laterality Date  . Coronary artery stent  2003  . Cardioversion N/A 06/07/2012    Procedure: CARDIOVERSION;  Surgeon: Lennette Bihari, MD;  Location: [facility] ENDOSCOPY;  Service: Cardiovascular;  Laterality: N/A;  . Cardioversion N/A 08/17/2012    Procedure: CARDIOVERSION;  Surgeon: Chrystie Nose, MD;  Location: [facility] ENDOSCOPY;  Service: Cardiovascular;  Laterality: N/A;    Allergies  Allergen Reactions  . Ranitidine Hcl     REACTION: throat swelling, difficulty  breathing  . Beta Adrenergic Blockers     REACTION: sexual side effects  . Ramipril     REACTION: throat swelling  . Sulfonamide Derivatives     REACTION: childhood reation of hematuria  . Ticlopidine Hcl     REACTION: swelling    Current Outpatient Prescriptions  Medication Sig Dispense Refill  . albuterol (PROVENTIL HFA;VENTOLIN HFA) 108 (90 BASE) MCG/ACT inhaler Inhale 2 puffs into the lungs every 6  (six) hours as needed for wheezing.      Marland Kitchen apixaban (ELIQUIS) 5 MG TABS tablet Take 5 mg by mouth 2 (two) times daily.      Marland Kitchen aspirin 81 MG tablet Take 81 mg by mouth daily.        Marland Kitchen atorvastatin (LIPITOR) 40 MG tablet 1 by mouth at bedtime      . budesonide-formoterol (SYMBICORT) 160-4.5 MCG/ACT inhaler Inhale 2 puffs into the lungs 2 (two) times daily.  1 Inhaler  12  . diltiazem (CARDIZEM CD) 240 MG 24 hr capsule Take 1 capsule (240 mg total) by mouth daily.  30 capsule  5  . hydrochlorothiazide (MICROZIDE) 12.5 MG capsule Take 1 capsule (12.5 mg total) by mouth daily.  90 capsule  3  . isosorbide mononitrate (IMDUR) 15 mg TB24 Take 0.5 tablets (15 mg total) by mouth daily.  30 tablet  6  . Multiple Vitamins-Minerals (MULTIVITAMIN WITH MINERALS) tablet Take 1 tablet by mouth daily.        . nebivolol (BYSTOLIC) 10 MG tablet Take two tablets daily.      . pantoprazole (PROTONIX) 40 MG tablet Take 40 mg by mouth daily.        . tamsulosin (FLOMAX) 0.4 MG CAPS Take 1 capsule (0.4 mg total) by mouth daily.  30 capsule  6  . tiotropium (SPIRIVA) 18 MCG inhalation capsule Place 18 mcg into inhaler and inhale daily.         No current facility-administered medications for this visit.    History   Social History  . Marital Status: Married    Spouse Name: N/A    Number of Children: 2  . Years of Education: N/A   Occupational History  . 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 Smoking status: Current Every Day Smoker -- 1.00 packs/day for 55 years    Types: Cigarettes  . Smokeless tobacco: Never Used     Comment: started at age 28.  prev was 2 ppd.   . Alcohol Use: 1.8 oz/week    3 Shots of liquor per week  . Drug Use: No  . Sexual Activity: Not on file   Other Topics Concern  . Not on file   Social History Narrative  . No narrative on file    ROS is negative for fevers, chills or night sweats. He denies visual symptoms he denies tremors.  He denies  any awareness of increased heart rate and is unaware that his heart rhythm is irregular. He denies that he wheezes much. He denies bleeding. He denies chest pressure. He still smokes cigarettes. He denies bleeding. There is trace leg swelling. There is no claudication. There is no diabetes. He sleeps with CPAP. Other system review is negative.  PE BP 122/80  Pulse 109  Ht 6' (1.829 m)  Wt 199 lb 6.4 oz (90.447 kg)  BMI 27.04 kg/m2  General: Alert, oriented, no distress.  Skin: normal turgor, no rashes HEENT: Normocephalic, atraumatic. Pupils round and  reactive; sclera anicteric;no lid lag.  Nose without nasal septal hypertrophy Mouth/Parynx benign; Mallinpatti scale 3 Neck: No JVD, no carotid briuts Lungs: Diffusely decreased breath sounds due to long-standing tobacco use.; no wheezing or rales Heart: Irregularly irregular with a ventricular rate at approximately 100 beats per minute, s1 s2 normal 1/6 systolic murmur Abdomen: soft, nontender; no hepatosplenomehaly, BS+; abdominal aorta nontender and not dilated by palpation. Pulses 2+ Extremities: 1+ ankle edema and trace pretibial edema no clubbing cyanosis, Homan's sign negative  Neurologic: grossly nonfocal  ECG: Atrial fibrillation at 109 per minute with nonspecific ST changes.  LABS:  BMET    Component Value Date/Time   NA 143 08/18/2012 0500   K 4.2 08/18/2012 0500   CL 106 08/18/2012 0500   CO2 29 08/18/2012 0500   GLUCOSE 92 08/18/2012 0500   BUN 20 08/18/2012 0500   CREATININE 1.30 08/18/2012 0500   CALCIUM 8.8 08/18/2012 0500   GFRNONAA 54* 08/18/2012 0500   GFRAA 62* 08/18/2012 0500     Hepatic Function Panel     Component Value Date/Time   PROT 6.9 07/28/2012 1244   ALBUMIN 3.2* 07/28/2012 1244   AST 17 07/28/2012 1244   ALT 15 07/28/2012 1244   ALKPHOS 75 07/28/2012 1244   BILITOT 0.3 07/28/2012 1244     CBC    Component Value Date/Time   WBC 7.4 08/18/2012 0500   RBC 3.65* 08/18/2012 0500   HGB 10.7* 08/18/2012  0500   HCT 33.4* 08/18/2012 0500   PLT 140* 08/18/2012 0500   MCV 91.5 08/18/2012 0500   MCH 29.3 08/18/2012 0500   MCHC 32.0 08/18/2012 0500   RDW 13.9 08/18/2012 0500   LYMPHSABS 1.8 08/16/2012 1134   MONOABS 1.0 08/16/2012 1134   EOSABS 0.1 08/16/2012 1134   BASOSABS 0.0 08/16/2012 1134     BNP    Component Value Date/Time   PROBNP 1803.0* 08/16/2012 2115    Lipid Panel  No results found for this basename: chol,  trig,  hdl,  cholhdl,  vldl,  ldlcalc     RADIOLOGY: Dg Chest Port 1 View  08/16/2012   *RADIOLOGY REPORT*  Clinical Data: Short of breath  PORTABLE CHEST - 1 VIEW  Comparison: 08/03/2012  Findings: COPD with hyperinflation of the lungs.  Negative for pneumonia.  Negative for heart failure or effusion.  IMPRESSION: No acute abnormality.  COPD.   Original Report Authenticated By: Janeece Riggers, M.D.      ASSESSMENT AND PLAN: [name] now has permanent atrial fibrillation. He is maintained on anticoagulation with eliquis. He still smokes but is significantly reduced his tobacco use and also has substituted some of this with the electronic cigarette use. Again discussed the absolute importance of complete smoking cessation. His ventricular rate today is not well controlled. 7 peripheral edema which may be contributed by his current dose of Cardizem and therefore I will not further increase his Cardizem for improved rate. He does have an ejection fraction of 55-60%. Presently on further titrating his Bystolic to 20 mg in the morning and 10 mg at night. He will take 25 mg HCTZ today, and then he can alter 12.5 versus 25 mg depending upon his leg edema. I'll see him in 4 months for cardiology reevaluation the   Lennette Bihari, MD, [facility]  11/02/2012 8:33 AM

## 2012-11-05 ENCOUNTER — Encounter: Payer: Self-pay | Admitting: Cardiovascular Disease

## 2012-11-12 ENCOUNTER — Ambulatory Visit: Payer: Medicare Other | Admitting: Pulmonary Disease

## 2012-12-06 ENCOUNTER — Other Ambulatory Visit: Payer: Self-pay

## 2013-01-16 ENCOUNTER — Telehealth: Payer: Self-pay | Admitting: Cardiovascular Disease

## 2013-01-16 NOTE — Telephone Encounter (Signed)
Returned call and pt verified x 2 w/ pt's wife, Delray Alt.  Informed message received.  Asked how long pt has had swelling.  Wife asked pt (in background) and stated it has been going on for 6 months.  Denied any CP and stated SOB is the same w/ COPD.  Stated swelling has just been getting worse and worse.  Informed Dr. Tresa Endo will be notified for further instructions.  Advised they call before 4pm with any concerns so that RN has time to discuss w/ MD.  Also advised pt go to ER for any worsening symptoms overnight.  Verbalized understanding and agreed w/ plan.  Message forwarded to Dr. Tresa Endo.  This note printed and placed on Dr. Landry Dyke cart for review.

## 2013-01-16 NOTE — Telephone Encounter (Signed)
If on HCTZ 12.5 mg, inc to 25 mg daily, if continues then may need to change to lasix 20 mg.

## 2013-01-16 NOTE — Telephone Encounter (Signed)
Please call-feet is swelling.She wants to know if he double his Hydrochlorothiazide?Marland Kitchen

## 2013-01-17 NOTE — Telephone Encounter (Signed)
Returned call and informed pt per instructions by MD.  Pt repeated instructions of increased HCTZ dose.  Advised to monitor for signs of dehydration and call back if he develops any.  Pt verbalized understanding and agreed w/ plan.  Pt will call back on Monday to notify if working and need new Rx.

## 2013-02-07 ENCOUNTER — Ambulatory Visit: Payer: Medicare Other | Admitting: Cardiovascular Disease

## 2013-02-20 ENCOUNTER — Ambulatory Visit (INDEPENDENT_AMBULATORY_CARE_PROVIDER_SITE_OTHER): Payer: Medicare Other | Admitting: General Surgery

## 2013-02-20 ENCOUNTER — Encounter (INDEPENDENT_AMBULATORY_CARE_PROVIDER_SITE_OTHER): Payer: Self-pay | Admitting: General Surgery

## 2013-02-20 VITALS — BP 152/74 | HR 72 | Resp 20 | Ht 71.0 in | Wt 187.0 lb

## 2013-02-20 DIAGNOSIS — T148XXA Other injury of unspecified body region, initial encounter: Secondary | ICD-10-CM

## 2013-02-20 DIAGNOSIS — W5501XA Bitten by cat, initial encounter: Secondary | ICD-10-CM

## 2013-02-20 DIAGNOSIS — IMO0001 Reserved for inherently not codable concepts without codable children: Secondary | ICD-10-CM

## 2013-02-20 NOTE — Patient Instructions (Signed)
Follow up with the vet regarding rabies  Follow up with your primary care doctor for wound check Change wet-dry gauze daily, minimize moist gauze on skin around cut and cover with dry gauze and use a ace wrap or kerlix to keep gauze in place Elevate leg when not walking Call for temp >101, skin redness, foul smelling purulent drainage

## 2013-02-20 NOTE — Progress Notes (Signed)
Subjective:     Patient ID: [name], male   DOB: 16-Feb-1940, 73 y.o.   MRN: [id number]  HPI 73 year old Caucasian male sustained multiple bites and a cut from a cat this morning. Apparently it was an outside cat that had been injured and the patient picked up the cat to assess it injuries which didn't go well. The pt is on a blood thinner. Saw NP at his PCPs office and was given tetanus and wound bandaged. Cat taken to vets and sheriff's office notified. Chronic LE swelling.   PMHx, PSHx, SOCHx, FAMHx, ALL reviewed   Review of Systems 8 point ROS performed and negative except for above    Objective:   Physical Exam  Vitals reviewed. Constitutional: He is oriented to person, place, and time. He appears well-developed and well-nourished. No distress.  HENT:  Head: Normocephalic and atraumatic.  Eyes: Conjunctivae are normal. No scleral icterus.  Neck: Neck supple.  Pulmonary/Chest: Effort normal.  Neurological: He is alert and oriented to person, place, and time.  Skin: Skin is dry. He is not diaphoretic.  Punctuate superficial bite on Left hand at mcp of 3rd finger, superficial lac.   Left lower leg - open lac, not terribly deep.    BP 152/74  Pulse 72  Resp 20  Ht 5\' 11"  (1.803 m)  Wt 187 lb (84.823 kg)  BMI 26.09 kg/m2       Assessment:     Multiple cat bite     Plan:     The injury happened 8 hours ago and given the patient's lower extremity edema I believe trying to close the wound would result in a high degree of tension on the repair as well as increased risk of infection. It is no longer bleeding. I think he needs to heal by secondary intention. The patient and his wife was instructed on wet-to-dry dressings. He was instructed to change the dressing daily. They were instructed on what to call for. He can followup with his primary care physician's office for wound care. He was also instructed to followup with the vet regarding the cats rabies status  Leighton Ruff.  Redmond Pulling, MD, FACS General, Bariatric, & Minimally Invasive Surgery [facility], [place]

## 2013-02-22 ENCOUNTER — Telehealth (INDEPENDENT_AMBULATORY_CARE_PROVIDER_SITE_OTHER): Payer: Self-pay

## 2013-02-22 NOTE — Telephone Encounter (Signed)
Pt calling to report that he is having swelling at the site of his cat bit injury.  He called his PCP who instructed him to keep the area wrapped and elevated.  I explained it was important that he keep a damp/dry dressing on the area and f/u with PCP in a week or so.

## 2013-03-04 ENCOUNTER — Inpatient Hospital Stay (HOSPITAL_COMMUNITY)
Admission: AD | Admit: 2013-03-04 | Discharge: 2013-03-07 | DRG: 914 | Disposition: A | Discharge status: Home / Self Care | Payer: Medicare Other | Source: Ambulatory Visit | Attending: General Surgery | Admitting: General Surgery

## 2013-03-04 ENCOUNTER — Ambulatory Visit (INDEPENDENT_AMBULATORY_CARE_PROVIDER_SITE_OTHER): Payer: Medicare Other | Admitting: General Surgery

## 2013-03-04 ENCOUNTER — Telehealth (INDEPENDENT_AMBULATORY_CARE_PROVIDER_SITE_OTHER): Payer: Self-pay

## 2013-03-04 ENCOUNTER — Encounter (INDEPENDENT_AMBULATORY_CARE_PROVIDER_SITE_OTHER): Payer: Self-pay | Admitting: General Surgery

## 2013-03-04 VITALS — BP 158/98 | HR 88 | Temp 98.2°F | Resp 15 | Ht 71.0 in | Wt 188.6 lb

## 2013-03-04 DIAGNOSIS — S81009A Unspecified open wound, unspecified knee, initial encounter: Principal | ICD-10-CM | POA: Diagnosis present

## 2013-03-04 DIAGNOSIS — I2510 Atherosclerotic heart disease of native coronary artery without angina pectoris: Secondary | ICD-10-CM | POA: Diagnosis present

## 2013-03-04 DIAGNOSIS — J4489 Other specified chronic obstructive pulmonary disease: Secondary | ICD-10-CM | POA: Diagnosis present

## 2013-03-04 DIAGNOSIS — Z7982 Long term (current) use of aspirin: Secondary | ICD-10-CM

## 2013-03-04 DIAGNOSIS — I4891 Unspecified atrial fibrillation: Secondary | ICD-10-CM | POA: Diagnosis present

## 2013-03-04 DIAGNOSIS — T148XXA Other injury of unspecified body region, initial encounter: Secondary | ICD-10-CM | POA: Diagnosis present

## 2013-03-04 DIAGNOSIS — Z9861 Coronary angioplasty status: Secondary | ICD-10-CM

## 2013-03-04 DIAGNOSIS — IMO0001 Reserved for inherently not codable concepts without codable children: Secondary | ICD-10-CM

## 2013-03-04 DIAGNOSIS — Z8249 Family history of ischemic heart disease and other diseases of the circulatory system: Secondary | ICD-10-CM

## 2013-03-04 DIAGNOSIS — I96 Gangrene, not elsewhere classified: Secondary | ICD-10-CM

## 2013-03-04 DIAGNOSIS — I10 Essential (primary) hypertension: Secondary | ICD-10-CM | POA: Diagnosis present

## 2013-03-04 DIAGNOSIS — I129 Hypertensive chronic kidney disease with stage 1 through stage 4 chronic kidney disease, or unspecified chronic kidney disease: Secondary | ICD-10-CM | POA: Diagnosis present

## 2013-03-04 DIAGNOSIS — N184 Chronic kidney disease, stage 4 (severe): Secondary | ICD-10-CM | POA: Diagnosis present

## 2013-03-04 DIAGNOSIS — Z7901 Long term (current) use of anticoagulants: Secondary | ICD-10-CM

## 2013-03-04 DIAGNOSIS — N2889 Other specified disorders of kidney and ureter: Secondary | ICD-10-CM | POA: Diagnosis present

## 2013-03-04 DIAGNOSIS — J449 Chronic obstructive pulmonary disease, unspecified: Secondary | ICD-10-CM | POA: Diagnosis present

## 2013-03-04 DIAGNOSIS — N183 Chronic kidney disease, stage 3 unspecified: Secondary | ICD-10-CM | POA: Diagnosis present

## 2013-03-04 DIAGNOSIS — S61402A Unspecified open wound of left hand, initial encounter: Secondary | ICD-10-CM

## 2013-03-04 DIAGNOSIS — E785 Hyperlipidemia, unspecified: Secondary | ICD-10-CM | POA: Diagnosis present

## 2013-03-04 DIAGNOSIS — F17200 Nicotine dependence, unspecified, uncomplicated: Secondary | ICD-10-CM | POA: Diagnosis present

## 2013-03-04 DIAGNOSIS — I482 Chronic atrial fibrillation, unspecified: Secondary | ICD-10-CM

## 2013-03-04 DIAGNOSIS — W5501XA Bitten by cat, initial encounter: Secondary | ICD-10-CM

## 2013-03-04 DIAGNOSIS — L089 Local infection of the skin and subcutaneous tissue, unspecified: Secondary | ICD-10-CM

## 2013-03-04 DIAGNOSIS — S91009A Unspecified open wound, unspecified ankle, initial encounter: Principal | ICD-10-CM

## 2013-03-04 DIAGNOSIS — I739 Peripheral vascular disease, unspecified: Secondary | ICD-10-CM | POA: Diagnosis present

## 2013-03-04 DIAGNOSIS — G4733 Obstructive sleep apnea (adult) (pediatric): Secondary | ICD-10-CM | POA: Diagnosis present

## 2013-03-04 DIAGNOSIS — S81809A Unspecified open wound, unspecified lower leg, initial encounter: Principal | ICD-10-CM

## 2013-03-04 LAB — COMPREHENSIVE METABOLIC PANEL
ALT: 12 U/L (ref 0–53)
AST: 17 U/L (ref 0–37)
Albumin: 3.5 g/dL (ref 3.5–5.2)
Alkaline Phosphatase: 90 U/L (ref 39–117)
BUN: 31 mg/dL — AB (ref 6–23)
CO2: 26 meq/L (ref 19–32)
CREATININE: 1.87 mg/dL — AB (ref 0.50–1.35)
Calcium: 9.5 mg/dL (ref 8.4–10.5)
Chloride: 99 mEq/L (ref 96–112)
GFR, EST AFRICAN AMERICAN: 40 mL/min — AB (ref >90)
GFR, EST NON AFRICAN AMERICAN: 34 mL/min — AB (ref >90)
GLUCOSE: 128 mg/dL — AB (ref 70–99)
Potassium: 4.2 mEq/L (ref 3.7–5.3)
SODIUM: 140 meq/L (ref 137–147)
TOTAL PROTEIN: 7.2 g/dL (ref 6.0–8.3)
Total Bilirubin: 0.4 mg/dL (ref 0.3–1.2)

## 2013-03-04 LAB — CBC WITH DIFFERENTIAL/PLATELET
Basophils Absolute: 0 10*3/uL (ref 0.0–0.1)
Basophils Relative: 0 % (ref 0–1)
EOS ABS: 0.1 10*3/uL (ref 0.0–0.7)
EOS PCT: 1 % (ref 0–5)
HCT: 38.9 % — ABNORMAL LOW (ref 39.0–52.0)
Hemoglobin: 13.2 g/dL (ref 13.0–17.0)
LYMPHS PCT: 18 % (ref 12–46)
Lymphs Abs: 1.7 10*3/uL (ref 0.7–4.0)
MCH: 29.5 pg (ref 26.0–34.0)
MCHC: 33.9 g/dL (ref 30.0–36.0)
MCV: 87 fL (ref 78.0–100.0)
MONOS PCT: 12 % (ref 3–12)
Monocytes Absolute: 1.1 10*3/uL — ABNORMAL HIGH (ref 0.1–1.0)
Neutro Abs: 6.4 10*3/uL (ref 1.7–7.7)
Neutrophils Relative %: 69 % (ref 43–77)
PLATELETS: 176 10*3/uL (ref 150–400)
RBC: 4.47 MIL/uL (ref 4.22–5.81)
RDW: 15.2 % (ref 11.5–15.5)
WBC: 9.3 10*3/uL (ref 4.0–10.5)

## 2013-03-04 LAB — PROTIME-INR
INR: 0.92 (ref 0.00–1.49)
Prothrombin Time: 12.2 seconds (ref 11.6–15.2)

## 2013-03-04 MED ORDER — PANTOPRAZOLE SODIUM 40 MG IV SOLR
40.0000 mg | Freq: Every day | INTRAVENOUS | Status: DC
  Administered 2013-03-04 – 2013-03-06 (×3): 40 mg via INTRAVENOUS
  Filled 2013-03-04 (×5): qty 40

## 2013-03-04 MED ORDER — ONDANSETRON HCL 4 MG/2ML IJ SOLN: 4.0000 mg | Freq: Four times a day (QID) | INTRAMUSCULAR | Status: DC | PRN

## 2013-03-04 MED ORDER — DEXTROSE-NACL 5-0.45 % IV SOLN
INTRAVENOUS | Status: DC
  Administered 2013-03-04 – 2013-03-05 (×2): via INTRAVENOUS

## 2013-03-04 MED ORDER — CLINDAMYCIN PHOSPHATE 600 MG/50ML IV SOLN
600.0000 mg | Freq: Three times a day (TID) | INTRAVENOUS | Status: DC
  Administered 2013-03-04 – 2013-03-05 (×2): 600 mg via INTRAVENOUS
  Filled 2013-03-04 (×4): qty 50

## 2013-03-04 MED ORDER — HYDROMORPHONE HCL PF 1 MG/ML IJ SOLN: 1.0000 mg | INTRAMUSCULAR | Status: DC | PRN

## 2013-03-04 NOTE — Progress Notes (Signed)
The patient comes in today after previous evaluation of a cat bite on his left lower extremity. It is on the medial aspect of his distal left leg. The wound is frankly necrotic today. He has a very bad foul odor and even underneath the eschar on the wound there is necrosis deep into the subcutaneous tissue. The patient is on a non-reversible blood thinning agent and also has an excruciating amount of pain with attempts to debride this wound.  Before and after pictures of this necrotic wound after debridement are included with this no. Augmentin has been prescribed for the patient by his primary care physician. Because of the amount of pain in the amount of what continues to be anaerobic necrosis of this wound I believe the patient should be admitted for debridement under anesthesia in the operating room.  I will discuss this with the surgeon on call at the hospital.    Before    After  Patient has excruciating pain in his lower extremity.  Will admit the patient for I&D at [facility].  Will place on IV clindamycin and make NPO after midnight.  Kathryne Eriksson. Dahlia Bailiff, MD, [place] [phone] [facility]

## 2013-03-04 NOTE — H&P (Signed)
[name] is an 73 y.o. male.   Chief Complaint: The patient comes in with a necrotic, infected left leg wound HPI: The patient was previously seen in our clinic a few days ago in the urgent of his with a recent cat bite.  At the time of this evaluation the wound appeared to be completely viable. We were called today by his primary care physician because of what appeared to be grossly infected. This was not an exaggeration whatsoever. The patient came in with a grossly infected and necrotic left leg wound that required operative debridement at the hospital. Partial debridement was done in the office.  Past Medical History  Diagnosis Date  . COPD (chronic obstructive pulmonary disease)   . Active smoker   . Sleep apnea     managed by Dr. Shelia Media  . Obesity   . Coronary artery disease   . Benign neoplasm of colon   . Atrial fibrillation   . Pyelonephritis, unspecified   . Other and unspecified hyperlipidemia   . Personal history of peptic ulcer disease   . Elevated prostate specific antigen (PSA)   . Diaphragmatic hernia without mention of obstruction or gangrene   . Esophageal reflux   . Tobacco use disorder   . Unspecified essential hypertension   . Peripheral vascular disease     Past Surgical History  Procedure Laterality Date  . Coronary artery stent  2003  . Cardioversion N/A 06/07/2012    Procedure: CARDIOVERSION;  Surgeon: Troy Sine, MD;  Location: [place];  Service: Cardiovascular;  Laterality: N/A;  . Cardioversion N/A 08/17/2012    Procedure: CARDIOVERSION;  Surgeon: Pixie Casino, MD;  Location: [facility] ENDOSCOPY;  Service: Cardiovascular;  Laterality: N/A;    Family History  Problem Relation Age of Onset  . Heart disease Mother   . Coronary artery disease Father   . Emphysema Father    Social History:  reports that he has been smoking Cigarettes.  He has a 55 pack-year smoking history. He has never used smokeless tobacco. He reports that he drinks about 1.8  ounces of alcohol per week. He reports that he does not use illicit drugs.  Allergies:  Allergies  Allergen Reactions  . Ranitidine Hcl     REACTION: throat swelling, difficulty breathing  . Beta Adrenergic Blockers     REACTION: sexual side effects  . Ramipril     REACTION: throat swelling  . Sulfonamide Derivatives     REACTION: childhood reation of hematuria  . Ticlopidine Hcl     REACTION: swelling     (Not in a hospital admission)  No results found for this or any previous visit (from the past 48 hour(s)). No results found.  Review of Systems  Constitutional: Negative for fever and chills.  Musculoskeletal:       Ugly foul smelling leg wound  Skin:       Open foul-smelling wound of the left leg    Blood pressure 158/98, pulse 88, temperature 98.2 F (36.8 C), temperature source Oral, resp. rate 15, height 5\' 11"  (1.803 m), weight 188 lb 9.6 oz (85.548 kg). Physical Exam  Constitutional: He is oriented to person, place, and time. He appears well-developed and well-nourished.  Eyes: Pupils are equal, round, and reactive to light.  Neck: Normal range of motion. Neck supple.  Cardiovascular: Normal rate.  An irregular rhythm present.  Respiratory: Effort normal. He has wheezes. He has no rales. He exhibits no tenderness.  GI: Soft.  Musculoskeletal:  Normal range of motion.       Left lower leg: He exhibits tenderness, swelling (marked chronic LE swelling), edema and laceration (open wound as described.).       Legs: Neurological: He is alert and oriented to person, place, and time. He has normal reflexes.  Skin: Skin is warm and dry.  Psychiatric: He has a normal mood and affect. His behavior is normal. Judgment and thought content normal.     Assessment/Plan Necrotic lower extremity wound, anaerobic and aerobic.  Needs OR debridement.  Admitted for IV antibiotics.  Partially debrided in the office    Before    After  [name] 03/04/2013, 4:54  PM

## 2013-03-04 NOTE — Telephone Encounter (Signed)
Dr. Shelia Media states patient cat bite is getting infected per Sonia Baller needs a urg appt today for debridement appt today @315 

## 2013-03-04 NOTE — Progress Notes (Signed)
Patient placed himself on his home CPAP unit.

## 2013-03-04 NOTE — Telephone Encounter (Signed)
Not sure how much "debridement" we can do in office especially with pt on eliquis. Spoke with Dr Hulen Skains to review my note. He is going to review chart and let know if pt would be better off going to ED

## 2013-03-05 ENCOUNTER — Encounter (HOSPITAL_COMMUNITY): Admission: AD | Disposition: A | Discharge status: Home / Self Care | Payer: Self-pay | Source: Ambulatory Visit

## 2013-03-05 ENCOUNTER — Inpatient Hospital Stay (HOSPITAL_COMMUNITY): Payer: Medicare Other | Admitting: Anesthesiology

## 2013-03-05 ENCOUNTER — Encounter (HOSPITAL_COMMUNITY): Payer: Medicare Other | Admitting: Anesthesiology

## 2013-03-05 ENCOUNTER — Encounter (HOSPITAL_COMMUNITY): Payer: Self-pay | Admitting: General Practice

## 2013-03-05 DIAGNOSIS — S91009A Unspecified open wound, unspecified ankle, initial encounter: Secondary | ICD-10-CM

## 2013-03-05 DIAGNOSIS — I4891 Unspecified atrial fibrillation: Secondary | ICD-10-CM

## 2013-03-05 DIAGNOSIS — I482 Chronic atrial fibrillation, unspecified: Secondary | ICD-10-CM | POA: Diagnosis present

## 2013-03-05 DIAGNOSIS — S81809A Unspecified open wound, unspecified lower leg, initial encounter: Secondary | ICD-10-CM

## 2013-03-05 DIAGNOSIS — S81009A Unspecified open wound, unspecified knee, initial encounter: Secondary | ICD-10-CM

## 2013-03-05 HISTORY — PX: I & D EXTREMITY: SHX5045

## 2013-03-05 LAB — SURGICAL PCR SCREEN
MRSA, PCR: NEGATIVE
Staphylococcus aureus: NEGATIVE

## 2013-03-05 LAB — PRO B NATRIURETIC PEPTIDE: Pro B Natriuretic peptide (BNP): 5009 pg/mL — ABNORMAL HIGH (ref 0–125)

## 2013-03-05 SURGERY — IRRIGATION AND DEBRIDEMENT EXTREMITY
Anesthesia: General | Site: Leg Lower | Laterality: Left

## 2013-03-05 MED ORDER — MIDAZOLAM HCL 2 MG/2ML IJ SOLN
INTRAMUSCULAR | Status: AC
  Filled 2013-03-05: qty 2

## 2013-03-05 MED ORDER — PIPERACILLIN-TAZOBACTAM 3.375 G IVPB 30 MIN
3.3750 g | INTRAVENOUS | Status: AC
  Administered 2013-03-05: 3.375 g via INTRAVENOUS
  Filled 2013-03-05: qty 50

## 2013-03-05 MED ORDER — PHENYLEPHRINE 40 MCG/ML (10ML) SYRINGE FOR IV PUSH (FOR BLOOD PRESSURE SUPPORT)
PREFILLED_SYRINGE | INTRAVENOUS | Status: AC
  Filled 2013-03-05: qty 10

## 2013-03-05 MED ORDER — CEFAZOLIN SODIUM-DEXTROSE 2-3 GM-% IV SOLR
INTRAVENOUS | Status: AC
  Filled 2013-03-05: qty 50

## 2013-03-05 MED ORDER — SODIUM CHLORIDE 0.9 % IR SOLN
Status: DC | PRN
  Administered 2013-03-05: 1

## 2013-03-05 MED ORDER — NALOXONE HCL 0.4 MG/ML IJ SOLN
INTRAMUSCULAR | Status: AC
  Filled 2013-03-05: qty 1

## 2013-03-05 MED ORDER — NEBIVOLOL HCL 20 MG PO TABS: 0.5000 | ORAL_TABLET | ORAL | Status: DC

## 2013-03-05 MED ORDER — OXYCODONE HCL 5 MG/5ML PO SOLN: 5.0000 mg | Freq: Once | ORAL | Status: DC | PRN

## 2013-03-05 MED ORDER — PHENYLEPHRINE HCL 10 MG/ML IJ SOLN
INTRAMUSCULAR | Status: DC | PRN
  Administered 2013-03-05: 160 ug via INTRAVENOUS

## 2013-03-05 MED ORDER — TIOTROPIUM BROMIDE MONOHYDRATE 18 MCG IN CAPS
18.0000 ug | ORAL_CAPSULE | Freq: Every day | RESPIRATORY_TRACT | Status: DC
  Administered 2013-03-05 – 2013-03-07 (×3): 18 ug via RESPIRATORY_TRACT
  Filled 2013-03-05 (×2): qty 5

## 2013-03-05 MED ORDER — FENTANYL CITRATE 0.05 MG/ML IJ SOLN
INTRAMUSCULAR | Status: AC
  Filled 2013-03-05: qty 5

## 2013-03-05 MED ORDER — FUROSEMIDE 10 MG/ML IJ SOLN
20.0000 mg | Freq: Once | INTRAMUSCULAR | Status: AC
  Administered 2013-03-05: 20 mg via INTRAVENOUS
  Filled 2013-03-05: qty 2

## 2013-03-05 MED ORDER — DILTIAZEM HCL 100 MG IV SOLR
5.0000 mg/h | INTRAVENOUS | Status: DC
  Administered 2013-03-05: 10 mg/h via INTRAVENOUS
  Administered 2013-03-05: 8 mg/h via INTRAVENOUS
  Filled 2013-03-05 (×2): qty 100

## 2013-03-05 MED ORDER — LIDOCAINE HCL (CARDIAC) 20 MG/ML IV SOLN
INTRAVENOUS | Status: AC
  Filled 2013-03-05: qty 5

## 2013-03-05 MED ORDER — MIDAZOLAM HCL 5 MG/5ML IJ SOLN
INTRAMUSCULAR | Status: DC | PRN
Start: 2013-03-05 — End: 2013-03-05
  Administered 2013-03-05: 2 mg via INTRAVENOUS

## 2013-03-05 MED ORDER — ESMOLOL HCL 10 MG/ML IV SOLN
INTRAVENOUS | Status: DC | PRN
  Administered 2013-03-05: 20 mg via INTRAVENOUS
  Administered 2013-03-05: 10 mg via INTRAVENOUS

## 2013-03-05 MED ORDER — NEBIVOLOL HCL 10 MG PO TABS
20.0000 mg | ORAL_TABLET | Freq: Every day | ORAL | Status: DC
  Administered 2013-03-05 – 2013-03-06 (×2): 20 mg via ORAL
  Filled 2013-03-05 (×4): qty 2

## 2013-03-05 MED ORDER — OXYCODONE-ACETAMINOPHEN 5-325 MG PO TABS
1.0000 | ORAL_TABLET | ORAL | Status: DC | PRN
  Administered 2013-03-06: 1 via ORAL
  Filled 2013-03-05: qty 1
  Filled 2013-03-05: qty 2

## 2013-03-05 MED ORDER — NEBIVOLOL HCL 10 MG PO TABS
10.0000 mg | ORAL_TABLET | Freq: Every day | ORAL | Status: DC
  Administered 2013-03-06: 10 mg via ORAL
  Filled 2013-03-05 (×4): qty 1

## 2013-03-05 MED ORDER — FENTANYL CITRATE 0.05 MG/ML IJ SOLN: 25.0000 ug | INTRAMUSCULAR | Status: DC | PRN

## 2013-03-05 MED ORDER — OXYCODONE HCL 5 MG PO TABS: 5.0000 mg | ORAL_TABLET | Freq: Once | ORAL | Status: DC | PRN

## 2013-03-05 MED ORDER — BUDESONIDE-FORMOTEROL FUMARATE 160-4.5 MCG/ACT IN AERO
2.0000 | INHALATION_SPRAY | Freq: Two times a day (BID) | RESPIRATORY_TRACT | Status: DC
  Administered 2013-03-05 – 2013-03-07 (×3): 2 via RESPIRATORY_TRACT
  Filled 2013-03-05 (×2): qty 6

## 2013-03-05 MED ORDER — FUROSEMIDE 10 MG/ML IJ SOLN
INTRAMUSCULAR | Status: AC
  Filled 2013-03-05: qty 4

## 2013-03-05 MED ORDER — LACTATED RINGERS IV SOLN
INTRAVENOUS | Status: DC
  Administered 2013-03-05: 12:00:00 via INTRAVENOUS

## 2013-03-05 MED ORDER — HYDROCHLOROTHIAZIDE 12.5 MG PO CAPS
12.5000 mg | ORAL_CAPSULE | Freq: Every day | ORAL | Status: DC
  Administered 2013-03-05 – 2013-03-06 (×2): 12.5 mg via ORAL
  Filled 2013-03-05 (×3): qty 1

## 2013-03-05 MED ORDER — PROPOFOL 10 MG/ML IV BOLUS
INTRAVENOUS | Status: DC | PRN
Start: 2013-03-05 — End: 2013-03-05
  Administered 2013-03-05: 150 mg via INTRAVENOUS

## 2013-03-05 MED ORDER — FENTANYL CITRATE 0.05 MG/ML IJ SOLN
INTRAMUSCULAR | Status: DC | PRN
  Administered 2013-03-05: 75 ug via INTRAVENOUS
  Administered 2013-03-05: 50 ug via INTRAVENOUS

## 2013-03-05 MED ORDER — ONDANSETRON HCL 4 MG/2ML IJ SOLN: 4.0000 mg | Freq: Four times a day (QID) | INTRAMUSCULAR | Status: DC | PRN

## 2013-03-05 MED ORDER — LIDOCAINE HCL (CARDIAC) 20 MG/ML IV SOLN
INTRAVENOUS | Status: DC | PRN
  Administered 2013-03-05: 80 mg via INTRAVENOUS

## 2013-03-05 MED ORDER — PROPOFOL 10 MG/ML IV BOLUS
INTRAVENOUS | Status: AC
  Filled 2013-03-05: qty 20

## 2013-03-05 MED ORDER — ESMOLOL HCL 10 MG/ML IV SOLN
INTRAVENOUS | Status: AC
Start: 2013-03-05 — End: 2013-03-05
  Filled 2013-03-05: qty 10

## 2013-03-05 MED ORDER — ISOSORBIDE MONONITRATE ER 30 MG PO TB24
30.0000 mg | ORAL_TABLET | Freq: Every day | ORAL | Status: DC
  Administered 2013-03-05 – 2013-03-06 (×2): 30 mg via ORAL
  Filled 2013-03-05 (×3): qty 1

## 2013-03-05 MED ORDER — LACTATED RINGERS IV SOLN
INTRAVENOUS | Status: DC | PRN
  Administered 2013-03-05: 12:00:00 via INTRAVENOUS

## 2013-03-05 MED ORDER — PIPERACILLIN-TAZOBACTAM 3.375 G IVPB
3.3750 g | Freq: Three times a day (TID) | INTRAVENOUS | Status: DC
  Administered 2013-03-05 – 2013-03-07 (×5): 3.375 g via INTRAVENOUS
  Filled 2013-03-05 (×10): qty 50

## 2013-03-05 MED ORDER — ROCURONIUM BROMIDE 50 MG/5ML IV SOLN
INTRAVENOUS | Status: AC
  Filled 2013-03-05: qty 1

## 2013-03-05 MED ORDER — DILTIAZEM HCL ER BEADS 240 MG PO CP24
360.0000 mg | ORAL_CAPSULE | Freq: Every day | ORAL | Status: DC
  Administered 2013-03-05: 360 mg via ORAL
  Filled 2013-03-05 (×2): qty 1

## 2013-03-05 MED ORDER — DEXTROSE-NACL 5-0.45 % IV SOLN
INTRAVENOUS | Status: DC
  Administered 2013-03-05: 50 mL/h via INTRAVENOUS

## 2013-03-05 SURGICAL SUPPLY — 69 items
BANDAGE CONFORM 3  STR LF (GAUZE/BANDAGES/DRESSINGS) IMPLANT
BANDAGE ELASTIC 3 VELCRO ST LF (GAUZE/BANDAGES/DRESSINGS) IMPLANT
BANDAGE ELASTIC 4 VELCRO ST LF (GAUZE/BANDAGES/DRESSINGS) ×1 IMPLANT
BANDAGE GAUZE ELAST BULKY 4 IN (GAUZE/BANDAGES/DRESSINGS) ×1 IMPLANT
BLADE SURG 10 STRL SS (BLADE) ×2 IMPLANT
BNDG COHESIVE 1X5 TAN STRL LF (GAUZE/BANDAGES/DRESSINGS) IMPLANT
BNDG COHESIVE 4X5 TAN STRL (GAUZE/BANDAGES/DRESSINGS) ×1 IMPLANT
BNDG COHESIVE 6X5 TAN STRL LF (GAUZE/BANDAGES/DRESSINGS) ×2 IMPLANT
BNDG GAUZE STRTCH 6 (GAUZE/BANDAGES/DRESSINGS) ×3 IMPLANT
CLOTH BEACON ORANGE TIMEOUT ST (SAFETY) ×1 IMPLANT
CORDS BIPOLAR (ELECTRODE) IMPLANT
COVER SURGICAL LIGHT HANDLE (MISCELLANEOUS) ×2 IMPLANT
CUFF TOURNIQUET SINGLE 18IN (TOURNIQUET CUFF) ×1 IMPLANT
CUFF TOURNIQUET SINGLE 24IN (TOURNIQUET CUFF) IMPLANT
CUFF TOURNIQUET SINGLE 34IN LL (TOURNIQUET CUFF) IMPLANT
CUFF TOURNIQUET SINGLE 44IN (TOURNIQUET CUFF) IMPLANT
DRAPE ORTHO SPLIT 77X108 STRL (DRAPES) ×2
DRAPE SURG 17X23 STRL (DRAPES) IMPLANT
DRAPE SURG ORHT 6 SPLT 77X108 (DRAPES) ×2 IMPLANT
DRAPE U-SHAPE 47X51 STRL (DRAPES) ×1 IMPLANT
DRSG PAD ABDOMINAL 8X10 ST (GAUZE/BANDAGES/DRESSINGS) ×1 IMPLANT
DURAPREP 26ML APPLICATOR (WOUND CARE) ×1 IMPLANT
ELECT CAUTERY BLADE 6.4 (BLADE) ×1 IMPLANT
ELECT REM PT RETURN 9FT ADLT (ELECTROSURGICAL)
ELECTRODE REM PT RTRN 9FT ADLT (ELECTROSURGICAL) IMPLANT
GAUZE XEROFORM 1X8 LF (GAUZE/BANDAGES/DRESSINGS) ×1 IMPLANT
GLOVE BIO SURGEON STRL SZ7 (GLOVE) ×1 IMPLANT
GLOVE BIO SURGEON STRL SZ8 (GLOVE) ×1 IMPLANT
GLOVE BIOGEL PI IND STRL 7.0 (GLOVE) IMPLANT
GLOVE BIOGEL PI IND STRL 7.5 (GLOVE) IMPLANT
GLOVE BIOGEL PI IND STRL 8 (GLOVE) ×2 IMPLANT
GLOVE BIOGEL PI INDICATOR 7.0 (GLOVE) ×1
GLOVE BIOGEL PI INDICATOR 7.5 (GLOVE) ×2
GLOVE BIOGEL PI INDICATOR 8 (GLOVE)
GLOVE ORTHO TXT STRL SZ7.5 (GLOVE) ×1 IMPLANT
GLOVE SURG SS PI 7.0 STRL IVOR (GLOVE) ×2 IMPLANT
GOWN PREVENTION PLUS LG XLONG (DISPOSABLE) IMPLANT
GOWN STRL REUS W/ TWL LRG LVL3 (GOWN DISPOSABLE) ×1 IMPLANT
GOWN STRL REUS W/ TWL XL LVL3 (GOWN DISPOSABLE) ×4 IMPLANT
GOWN STRL REUS W/TWL LRG LVL3 (GOWN DISPOSABLE)
GOWN STRL REUS W/TWL XL LVL3 (GOWN DISPOSABLE)
HANDPIECE INTERPULSE COAX TIP (DISPOSABLE)
KIT BASIN OR (CUSTOM PROCEDURE TRAY) ×2 IMPLANT
KIT ROOM TURNOVER OR (KITS) ×2 IMPLANT
MANIFOLD NEPTUNE II (INSTRUMENTS) ×1 IMPLANT
NS IRRIG 1000ML POUR BTL (IV SOLUTION) ×2 IMPLANT
PACK GENERAL/GYN (CUSTOM PROCEDURE TRAY) ×1 IMPLANT
PACK ORTHO EXTREMITY (CUSTOM PROCEDURE TRAY) ×1 IMPLANT
PAD ARMBOARD 7.5X6 YLW CONV (MISCELLANEOUS) ×3 IMPLANT
PADDING CAST ABS 4INX4YD NS (CAST SUPPLIES)
PADDING CAST ABS COTTON 4X4 ST (CAST SUPPLIES) ×2 IMPLANT
PADDING CAST COTTON 6X4 STRL (CAST SUPPLIES) ×1 IMPLANT
SET HNDPC FAN SPRY TIP SCT (DISPOSABLE) IMPLANT
SPONGE GAUZE 4X4 12PLY (GAUZE/BANDAGES/DRESSINGS) ×1 IMPLANT
SPONGE GAUZE 4X4 12PLY STER LF (GAUZE/BANDAGES/DRESSINGS) ×1 IMPLANT
SPONGE LAP 18X18 X RAY DECT (DISPOSABLE) ×1 IMPLANT
STOCKINETTE IMPERVIOUS 9X36 MD (GAUZE/BANDAGES/DRESSINGS) ×1 IMPLANT
SUT ETHILON 2 0 FS 18 (SUTURE) ×3 IMPLANT
SUT ETHILON 3 0 PS 1 (SUTURE) ×2 IMPLANT
SUT SILK 2 0 SH (SUTURE) ×1 IMPLANT
SYR CONTROL 10ML LL (SYRINGE) IMPLANT
TOWEL OR 17X24 6PK STRL BLUE (TOWEL DISPOSABLE) ×1 IMPLANT
TOWEL OR 17X26 10 PK STRL BLUE (TOWEL DISPOSABLE) ×2 IMPLANT
TUBE ANAEROBIC SPECIMEN COL (MISCELLANEOUS) IMPLANT
TUBE CONNECTING 12X1/4 (SUCTIONS) ×1 IMPLANT
TUBE FEEDING 5FR 15 INCH (TUBING) IMPLANT
UNDERPAD 30X30 INCONTINENT (UNDERPADS AND DIAPERS) ×2 IMPLANT
WATER STERILE IRR 1000ML POUR (IV SOLUTION) ×1 IMPLANT
YANKAUER SUCT BULB TIP NO VENT (SUCTIONS) ×1 IMPLANT

## 2013-03-05 NOTE — Op Note (Signed)
Preoperative diagnosis: 8x6 cm open necrotic wound s/p cat bite Postoperative diagnosis: same as above Procedure: debridement of 8x6 cm superficial necrotic wound left lower leg Surgeon: Dr. Serita Grammes Anesthesia: Gen. Specimens: None Drains: None Estimated blood loss: 25 cc Sponge needle count was correct completion Disposition to step down in stable condition  Indications: This is a 73 year old male who has multiple medical problems and was seen last week in our office after a cat bite sustained to his left lower leg. This had gotten worse and he was seen by Dr. Hulen Skains yesterday. This was thought to need operative debridement and he was transferred and admitted to the hospital. I saw him this morning and he has an 8 x 6 cm wound with some surrounding erythema as well as some necrosis present. We discussed operative debridement. He is on eliquis but I think that we should proceed sooner rather than later given the appearance of his wound.  Procedure: After informed consent was obtained patient was taken to the operating room. I gave him Zosyn per the recommendations for a cat bite. He had devices on for DVT prophylaxis. He was placed under general anesthesia.  Following this his atrial fibrillation was out of control. This was managed medically by anesthesia. His leg was then prepped and draped in the standard sterile surgical fashion. Surgical timeout was performed.  I debrided this with a curet there was a fair amount of necrotic tissue present. I debrided this down to healthy granulation tissue. I removed some of the outer rim of this necrotic area. I then obtained hemostasis. I then used a dressing and a wrap overlying this. He is going to be transferred to step down and we'll obtain a cardiology consult postoperatively.

## 2013-03-05 NOTE — Progress Notes (Addendum)
ANTIBIOTIC CONSULT NOTE - INITIAL  Pharmacy Consult for zosyn Indication: cat bite LLE,   Allergies  Allergen Reactions  . Ranitidine Hcl     REACTION: throat swelling, difficulty breathing  . Beta Adrenergic Blockers     REACTION: sexual side effects  . Ramipril     REACTION: throat swelling  . Sulfonamide Derivatives     REACTION: childhood reation of hematuria  . Ticlopidine Hcl     REACTION: swelling    Patient Measurements: Height: 5\' 11"  (180.3 cm) Weight: 188 lb 4.4 oz (85.4 kg) IBW/kg (Calculated) : 75.3   Vital Signs: Temp: 98 F (36.7 C) (02/03 0925) Temp src: Oral (02/03 0925) BP: 138/92 mmHg (02/03 0925) Pulse Rate: 83 (02/03 0925) Intake/Output from previous day: 02/02 0701 - 02/03 0700 In: 1717.9 [P.O.:360; I.V.:1257.9; IV Piggyback:100] Out: 450 [Urine:450] Intake/Output from this shift: Total I/O In: -  Out: 125 [Urine:125]  Labs:  Recent Labs  03/04/13 1943  WBC 9.3  HGB 13.2  PLT 176  CREATININE 1.87*   Estimated Creatinine Clearance: 38 ml/min (by C-G formula based on Cr of 1.87). No results found for this basename: VANCOTROUGH, Corlis Leak, VANCORANDOM, GENTTROUGH, GENTPEAK, GENTRANDOM, TOBRATROUGH, TOBRAPEAK, TOBRARND, AMIKACINPEAK, AMIKACINTROU, AMIKACIN,  in the last 72 hours   Microbiology: Recent Results (from the past 720 hour(s))  SURGICAL PCR SCREEN     Status: None   Collection Time    03/04/13 10:48 PM      Result Value Range Status   MRSA, PCR NEGATIVE  NEGATIVE Final   Staphylococcus aureus NEGATIVE  NEGATIVE Final   Comment:            The Xpert SA Assay (FDA     approved for NASAL specimens     in patients over 73 years of age),     is one component of     a comprehensive surveillance     program.  Test performance has     been validated by [employer] for patients greater     than or equal to 73 year old.     It is not intended     to diagnose infection nor to     guide or monitor treatment.    Medical  History: Past Medical History  Diagnosis Date  . COPD (chronic obstructive pulmonary disease)   . Active smoker   . Sleep apnea     managed by Dr. Shelia Media  . Obesity   . Coronary artery disease   . Benign neoplasm of colon   . Atrial fibrillation   . Pyelonephritis, unspecified   . Other and unspecified hyperlipidemia   . Personal history of peptic ulcer disease   . Elevated prostate specific antigen (PSA)   . Diaphragmatic hernia without mention of obstruction or gangrene   . Esophageal reflux   . Tobacco use disorder   . Unspecified essential hypertension   . Peripheral vascular disease     Medications:  Prescriptions prior to admission  Medication Sig Dispense Refill  . apixaban (ELIQUIS) 5 MG TABS tablet Take 5 mg by mouth 2 (two) times daily.      Marland Kitchen aspirin 81 MG tablet Take 81 mg by mouth daily.        Marland Kitchen atorvastatin (LIPITOR) 40 MG tablet 1 by mouth at bedtime      . budesonide-formoterol (SYMBICORT) 160-4.5 MCG/ACT inhaler Inhale 2 puffs into the lungs 2 (two) times daily.  1 Inhaler  12  . diltiazem (  TIAZAC) 360 MG 24 hr capsule Take 360 mg by mouth daily.      . hydrochlorothiazide (MICROZIDE) 12.5 MG capsule Take 1 capsule (12.5 mg total) by mouth daily.  90 capsule  3  . isosorbide mononitrate (IMDUR) 60 MG 24 hr tablet Take 30 mg by mouth daily.      . Multiple Vitamins-Minerals (MULTIVITAMIN WITH MINERALS) tablet Take 1 tablet by mouth daily.        . Nebivolol HCl 20 MG TABS Take 0.5-1 tablets by mouth See admin instructions. TAKE 1 TABLET IN THE AM AND 1 /2 TABLET IN THE PM      . tiotropium (SPIRIVA) 18 MCG inhalation capsule Place 18 mcg into inhaler and inhale daily.        . [DISCONTINUED] nebivolol 20 MG TABS TAKE 1 TABLET IN THE AM AND 1 /2 TABLET IN THE PM  45 tablet  6   Assessment: 73 yo man to start zosyn per pharmacy for severe LLE infection caused by a cat bit. Necrotic, infected L leg wound.  The wound needs debridement in the OR.  WBC 9.3, wt 85 kg,  creat 1.87, creat cl 38 ml/min.  Afebrile.  On Eliquis 5 mg po BID for afib, last dose 2/1.  clinda 2/2>>2/3 x 2 doses Zosyn 2/3>>  Goal of Therapy:  Treat infection  Plan:  1. Zosyn 3.375 gm IV q8h, infuse each dose over 4 hours. 2. F/u renal fxn 3. F/u for eliquis to be resumed post op for afib Eudelia Bunch, Pharm.D. [phone] 03/05/2013 10:18 AM

## 2013-03-05 NOTE — Progress Notes (Signed)
Agree with intern note.   Pryor Ochoa RD, LDN Pager: [phone] After Hours Pager: [phone]

## 2013-03-05 NOTE — Anesthesia Preprocedure Evaluation (Signed)
Anesthesia Evaluation  Patient identified by MRN, date of birth, ID band Patient awake    Reviewed: Allergy & Precautions, H&P , NPO status , Patient's Chart, lab work & pertinent test results  Airway Mallampati: II  Neck ROM: full    Dental   Pulmonary sleep apnea , COPDCurrent Smoker,          Cardiovascular hypertension, + CAD and + Peripheral Vascular Disease + dysrhythmias Atrial Fibrillation     Neuro/Psych  Neuromuscular disease    GI/Hepatic GERD-  ,  Endo/Other    Renal/GU Renal InsufficiencyRenal disease     Musculoskeletal   Abdominal   Peds  Hematology   Anesthesia Other Findings   Reproductive/Obstetrics                           Anesthesia Physical Anesthesia Plan  ASA: III  Anesthesia Plan: General   Post-op Pain Management:    Induction: Intravenous  Airway Management Planned: LMA  Additional Equipment:   Intra-op Plan:   Post-operative Plan:   Informed Consent: I have reviewed the patients History and Physical, chart, labs and discussed the procedure including the risks, benefits and alternatives for the proposed anesthesia with the patient or authorized representative who has indicated his/her understanding and acceptance.     Plan Discussed with: CRNA, Anesthesiologist and Surgeon  Anesthesia Plan Comments:         Anesthesia Quick Evaluation

## 2013-03-05 NOTE — Progress Notes (Signed)
  Subjective: Wants to eat  Objective: Vital signs in last 24 hours: Temp:  [97.5 F (36.4 C)-98.2 F (36.8 C)] 98 F (36.7 C) (02/03 0925) Pulse Rate:  [62-96] 83 (02/03 0925) Resp:  [15-23] 20 (02/03 0925) BP: (116-158)/(60-98) 138/92 mmHg (02/03 0925) SpO2:  [90 %-95 %] 93 % (02/03 0925) Weight:  [188 lb 4.4 oz (85.4 kg)-188 lb 9.6 oz (85.548 kg)] 188 lb 4.4 oz (85.4 kg) (02/02 1847) Last BM Date: 03/04/13  Intake/Output from previous day: 02/02 0701 - 02/03 0700 In: 1717.9 [P.O.:360; I.V.:1257.9; IV Piggyback:100] Out: 450 [Urine:450] Intake/Output this shift: Total I/O In: -  Out: 125 [Urine:125]  Extremities: palp dp pulse, 2+ le edema left leg, 8x6 necrotic area with surrounding erythema  Lab Results:   Recent Labs  03/04/13 1943  WBC 9.3  HGB 13.2  HCT 38.9*  PLT 176   BMET  Recent Labs  03/04/13 1943  NA 140  K 4.2  CL 99  CO2 26  GLUCOSE 128*  BUN 31*  CREATININE 1.87*  CALCIUM 9.5   PT/INR  Recent Labs  03/04/13 1943  LABPROT 12.2  INR 0.92    Anti-infectives: Anti-infectives   Start     Dose/Rate Route Frequency Ordered Stop   03/04/13 2200  clindamycin (CLEOCIN) IVPB 600 mg  Status:  Discontinued     600 mg 100 mL/hr over 30 Minutes Intravenous 3 times per day 03/04/13 1851 03/05/13 3704      Assessment/Plan: Cat bite LLE  Needs debridement in or, on eliquis and took last dose yesterday but I think that this cannot wait, discussed debridement, long term wound healing, possibly multiple surgeries, will do later today  [facility] 03/05/2013

## 2013-03-05 NOTE — Plan of Care (Signed)
Problem: Phase I Progression Outcomes Goal: Vital signs/hemodynamically stable Outcome: Progressing Patient admitted to stepdown for A-fib, rate more controlled with cardizem gtt

## 2013-03-05 NOTE — Progress Notes (Signed)
Brief Nutrition Note  Patient identified at risk by the Malnutrition Screening Tool.   Body mass index is 26.27 kg/(m^2).  Patient reports losing around 20 lb over the past year due to eating healthier and not eating as much fast food. Patient states that he was eating 3 meals a day PTA. Patient appears well nourished.   Current diet order is clear liquid, patient is consuming approximately 100% of meals on 2/3. Labs and medications reviewed. No further nutrition interventions warranted at his time. If additional nutrition issues arise, please re-consult RD.   Claudell Kyle, Dietetic Intern Pager: [phone]

## 2013-03-05 NOTE — Care Management Note (Addendum)
    Page 1 of 1   03/07/2013     10:27:56 AM   CARE MANAGEMENT NOTE 03/07/2013  Patient:  [name], [name]   Account Number:  [id number]  Date Initiated:  03/05/2013  Documentation initiated by:  Elissa Hefty  Subjective/Objective Assessment:   adm w cat bite, at fib     Action/Plan:   lives w wife, pcp dr Banker, hx of ahc   Anticipated DC Date:  03/07/2013   Anticipated DC Plan:  [place]         Choice offered to / List presented to:  C-1 Patient        [place] arranged  HH-1 RN      [place].   Status of service:  Completed, signed off Medicare Important Message given?   (If response is "NO", the following Medicare IM given date fields will be blank) Date Medicare IM given:   Date Additional Medicare IM given:    Discharge Disposition:  [place]  Per UR Regulation:  Reviewed for med. necessity/level of care/duration of stay  If discussed at [place] of Stay Meetings, dates discussed:    Comments:     03-06-13 Plan to discharge patient to home tomorrow 03-07-13 with home VAC . Facesheet information confirmed with patient and wife .  Patient would like [place] for [facility] .  Faxed clinicals to [facility] . Magdalen Spatz RN BSN [phone]

## 2013-03-05 NOTE — Anesthesia Postprocedure Evaluation (Signed)
Anesthesia Post Note  Patient: [name]  Procedure(s) Performed: Procedure(s) (LRB): IRRIGATION AND DEBRIDEMENT EXTREMITY (Left)  Anesthesia type: General  Patient location: PACU  Post pain: Pain level controlled and Adequate analgesia  Post assessment: Post-op Vital signs reviewed, Patient's Cardiovascular Status Stable, Respiratory Function Stable, Patent Airway and Pain level controlled  Last Vitals:  Filed Vitals:   03/05/13 1400  BP:   Pulse: 99  Temp:   Resp: 21    Post vital signs: Tachycardia  Level of consciousness: awake, alert  and oriented  Complications: Pt developed rapid AF.  Multiple doses of beta blockers unable to slow down rate.  Cardiology consulted and diltiazem drip started.  Pt remained stable throughout.

## 2013-03-05 NOTE — Preoperative (Signed)
Beta Blockers   Reason not to administer Beta Blockers:Not Applicable 

## 2013-03-05 NOTE — Consult Note (Signed)
Reason for Consult: Afib RVR Referring Physician:   HARVIN KONICEK is an 73 y.o. male.  HPI:   Mr. Meadow has established coronary artery disease and underwent remote stenting of his right coronary artery in 2003 by Dr. Glade Lloyd.  Repeat catheterization showed total occlusion of the RCA within the previously stented segment.  PTCA distal to his previously placed stents. He did have concomitant CAD of the LAD and circumflex vessels.  His history also includes infrarenal abdominal aortic aneurysm, tobacco, COPD/emphysema, hypertension, hyperlipidemia and obstructive sleep apnea(CPAP) and atrial fibrillation.  In March 2014 he was started on Eliquis. He had DCCV times 2 the second(08/16/12)while on Multaq 400 mg twice a day and further.  He is also on Bystolic. When Dr. Claiborne Billings last saw him in the office on 09/05/2012 he elected to keep him in atrial fibrillation.   The patient presented for I&D after sustaining a cat bite in the left lower extremity.  He went into rapid AF during surgery and was given esmolol with little effect.      Past Medical History  Diagnosis Date  . COPD (chronic obstructive pulmonary disease)   . Active smoker   . Sleep apnea     managed by Dr. Shelia Media  . Obesity   . Coronary artery disease   . Benign neoplasm of colon   . Atrial fibrillation   . Pyelonephritis, unspecified   . Other and unspecified hyperlipidemia   . Personal history of peptic ulcer disease   . Elevated prostate specific antigen (PSA)   . Diaphragmatic hernia without mention of obstruction or gangrene   . Esophageal reflux   . Tobacco use disorder   . Unspecified essential hypertension   . Peripheral vascular disease     Past Surgical History  Procedure Laterality Date  . Coronary artery stent  2003  . Cardioversion N/A 06/07/2012    Procedure: CARDIOVERSION;  Surgeon: Troy Sine, MD;  Location: [place];  Service: Cardiovascular;  Laterality: N/A;  . Cardioversion N/A 08/17/2012   Procedure: CARDIOVERSION;  Surgeon: Pixie Casino, MD;  Location: [facility] ENDOSCOPY;  Service: Cardiovascular;  Laterality: N/A;  . Coronary angioplasty    . Appendectomy      Family History  Problem Relation Age of Onset  . Heart disease Mother   . Coronary artery disease Father   . Emphysema Father     Social History:  reports that he has been smoking Cigarettes.  He has a 55 pack-year smoking history. He has never used smokeless tobacco. He reports that he drinks about 1.8 ounces of alcohol per week. He reports that he does not use illicit drugs.  Allergies:  Allergies  Allergen Reactions  . Ranitidine Hcl     REACTION: throat swelling, difficulty breathing  . Beta Adrenergic Blockers     REACTION: sexual side effects  . Ramipril     REACTION: throat swelling  . Sulfonamide Derivatives     REACTION: childhood reation of hematuria  . Ticlopidine Hcl     REACTION: swelling    Medications: Prior to Admission medications   Medication Sig Start Date End Date Taking? Authorizing Provider  apixaban (ELIQUIS) 5 MG TABS tablet Take 5 mg by mouth 2 (two) times daily.   Yes Historical Provider, MD  aspirin 81 MG tablet Take 81 mg by mouth daily.     Yes Historical Provider, MD  atorvastatin (LIPITOR) 40 MG tablet 1 by mouth at bedtime 06/20/11  Yes Historical Provider, MD  budesonide-formoterol (SYMBICORT)  160-4.5 MCG/ACT inhaler Inhale 2 puffs into the lungs 2 (two) times daily. 08/08/12 10/23/14 Yes Simonne Martinet, NP  diltiazem (TIAZAC) 360 MG 24 hr capsule Take 360 mg by mouth daily.   Yes Historical Provider, MD  hydrochlorothiazide (MICROZIDE) 12.5 MG capsule Take 1 capsule (12.5 mg total) by mouth daily. 09/05/12  Yes Lennette Bihari, MD  isosorbide mononitrate (IMDUR) 60 MG 24 hr tablet Take 30 mg by mouth daily.   Yes Historical Provider, MD  Multiple Vitamins-Minerals (MULTIVITAMIN WITH MINERALS) tablet Take 1 tablet by mouth daily.     Yes Historical Provider, MD  Nebivolol HCl 20  MG TABS Take 0.5-1 tablets by mouth See admin instructions. TAKE 1 TABLET IN THE AM AND 1 /2 TABLET IN THE PM 11/02/12  Yes Lennette Bihari, MD  tiotropium (SPIRIVA) 18 MCG inhalation capsule Place 18 mcg into inhaler and inhale daily.     Yes Historical Provider, MD     Results for orders placed during the hospital encounter of 03/04/13 (from the past 48 hour(s))  CBC WITH DIFFERENTIAL     Status: Abnormal   Collection Time    03/04/13  7:43 PM      Result Value Range   WBC 9.3  4.0 - 10.5 K/uL   RBC 4.47  4.22 - 5.81 MIL/uL   Hemoglobin 13.2  13.0 - 17.0 g/dL   HCT 66.6 (*) 62.3 - 12.9 %   MCV 87.0  78.0 - 100.0 fL   MCH 29.5  26.0 - 34.0 pg   MCHC 33.9  30.0 - 36.0 g/dL   RDW 06.4  61.4 - 01.2 %   Platelets 176  150 - 400 K/uL   Neutrophils Relative % 69  43 - 77 %   Neutro Abs 6.4  1.7 - 7.7 K/uL   Lymphocytes Relative 18  12 - 46 %   Lymphs Abs 1.7  0.7 - 4.0 K/uL   Monocytes Relative 12  3 - 12 %   Monocytes Absolute 1.1 (*) 0.1 - 1.0 K/uL   Eosinophils Relative 1  0 - 5 %   Eosinophils Absolute 0.1  0.0 - 0.7 K/uL   Basophils Relative 0  0 - 1 %   Basophils Absolute 0.0  0.0 - 0.1 K/uL  COMPREHENSIVE METABOLIC PANEL     Status: Abnormal   Collection Time    03/04/13  7:43 PM      Result Value Range   Sodium 140  137 - 147 mEq/L   Potassium 4.2  3.7 - 5.3 mEq/L   Chloride 99  96 - 112 mEq/L   CO2 26  19 - 32 mEq/L   Glucose, Bld 128 (*) 70 - 99 mg/dL   BUN 31 (*) 6 - 23 mg/dL   Creatinine, Ser 0.35 (*) 0.50 - 1.35 mg/dL   Calcium 9.5  8.4 - 81.3 mg/dL   Total Protein 7.2  6.0 - 8.3 g/dL   Albumin 3.5  3.5 - 5.2 g/dL   AST 17  0 - 37 U/L   ALT 12  0 - 53 U/L   Alkaline Phosphatase 90  39 - 117 U/L   Total Bilirubin 0.4  0.3 - 1.2 mg/dL   GFR calc non Af Amer 34 (*) >90 mL/min   GFR calc Af Amer 40 (*) >90 mL/min   Comment: (NOTE)     The eGFR has been calculated using the CKD EPI equation.     This calculation has not been  validated in all clinical situations.      eGFR's persistently <90 mL/min signify possible Chronic Kidney     Disease.  PROTIME-INR     Status: None   Collection Time    03/04/13  7:43 PM      Result Value Range   Prothrombin Time 12.2  11.6 - 15.2 seconds   INR 0.92  0.00 - 1.49  SURGICAL PCR SCREEN     Status: None   Collection Time    03/04/13 10:48 PM      Result Value Range   MRSA, PCR NEGATIVE  NEGATIVE   Staphylococcus aureus NEGATIVE  NEGATIVE   Comment:            The Xpert SA Assay (FDA     approved for NASAL specimens     in patients over 10 years of age),     is one component of     a comprehensive surveillance     program.  Test performance has     been validated by [employer] for patients greater     than or equal to 25 year old.     It is not intended     to diagnose infection nor to     guide or monitor treatment.  PRO B NATRIURETIC PEPTIDE     Status: Abnormal   Collection Time    03/05/13 10:05 AM      Result Value Range   Pro B Natriuretic peptide (BNP) 5009.0 (*) 0 - 125 pg/mL    No results found.  Review of Systems  Unable to perform ROS: mental acuity   Blood pressure 160/95, pulse 153, temperature 95.5 F (35.3 C), temperature source Oral, resp. rate 25, height $RemoveBe'5\' 11"'XzVeUusPV$  (1.803 m), weight 188 lb 4.4 oz (85.4 kg), SpO2 99.00%. Physical Exam  Nursing note and vitals reviewed. Constitutional: He appears well-developed and well-nourished. No distress.  HENT:  Head: Normocephalic and atraumatic.  Eyes: EOM are normal. Pupils are equal, round, and reactive to light. No scleral icterus.  Cardiovascular: S1 normal and S2 normal.  An irregularly irregular rhythm present. Tachycardia present.   No murmur heard. Pulses:      Radial pulses are 2+ on the right side, and 2+ on the left side.       Dorsalis pedis pulses are 2+ on the right side, and 2+ on the left side.  Respiratory: Effort normal and breath sounds normal.  GI: Bowel sounds are normal.  Musculoskeletal:  Trace LEE   Skin:  Skin is warm and dry.    Assessment/Plan: Principal Problem:   Animal bite wound Active Problems:   Atrial fibrillation with RVR   HYPERTENSION   CAD- RCA PCI '03   COPD (chronic obstructive pulmonary disease)   Sleep apnea, obstructive   Chronic anticoagulation - Eliquis   Chronic renal insufficiency, stage III (moderate)  Plan:  Give $Rem'10mg'EJCT$  IV cardizem now then start  Continuous infusion at 5-$RemoveBefor'10mg'ffDhGGMKVYNA$     HAGER, BRYAN 03/05/2013, 1:35 PM     As above, patient seen and examined. Briefly he is a 73 year old male with a past medical history of coronary artery disease, abdominal aortic aneurysm, COPD, hypertension, hyperlipidemia and permanent atrial fibrillation now status post debridement of infected leg wound to who I am asked to evaluate for atrial fibrillation with rapid ventricular response. The patient's LV function is normal. He was taken to the operating room for debridement of a leg wound earlier today. In the  operating room he was noted to have an elevated heart rate. At present he denies chest pain, dyspnea or palpitations. Would control his heart rate at present with IV Cardizem. Tomorrow morning I would resume by mouth Cardizem and beta blocker at preadmission dose. Would resume apixaban when okay with surgery. Would follow on telemetry while in house with further adjustments of AV nodal blocking agents as needed. We will follow. Kirk Ruths

## 2013-03-05 NOTE — Transfer of Care (Addendum)
Immediate Anesthesia Transfer of Care Note  Patient: [name]  Procedure(s) Performed: Procedure(s): IRRIGATION AND DEBRIDEMENT EXTREMITY (Left)  Patient Location: PACU  Anesthesia Type:General  Level of Consciousness: sedated  Airway & Oxygen Therapy: Patient Spontanous Breathing and Patient connected to face mask oxygen  Post-op Assessment: Pt in rapid Afib, MD aware. Cardiology aware  Post vital signs: see above  Complications: No apparent anesthesia complications

## 2013-03-05 NOTE — Progress Notes (Signed)
Received report from [facility], patient presented to room VS stable, no complaints of pain.  Granddaughter brought all patients belongings down from 6N including shoes, underpants, jacet, shirt, pants and C-Pap machine with black case.  Will continue to monitor closely.  Patient remains in A-fib rate in low 100's at this time.

## 2013-03-06 DIAGNOSIS — I4891 Unspecified atrial fibrillation: Secondary | ICD-10-CM

## 2013-03-06 DIAGNOSIS — Z7901 Long term (current) use of anticoagulants: Secondary | ICD-10-CM

## 2013-03-06 DIAGNOSIS — M79609 Pain in unspecified limb: Secondary | ICD-10-CM

## 2013-03-06 LAB — BASIC METABOLIC PANEL
BUN: 22 mg/dL (ref 6–23)
CO2: 30 meq/L (ref 19–32)
Calcium: 8.7 mg/dL (ref 8.4–10.5)
Chloride: 98 mEq/L (ref 96–112)
Creatinine, Ser: 1.75 mg/dL — ABNORMAL HIGH (ref 0.50–1.35)
GFR calc Af Amer: 43 mL/min — ABNORMAL LOW (ref >90)
GFR, EST NON AFRICAN AMERICAN: 37 mL/min — AB (ref >90)
GLUCOSE: 86 mg/dL (ref 70–99)
POTASSIUM: 4.2 meq/L (ref 3.7–5.3)
SODIUM: 139 meq/L (ref 137–147)

## 2013-03-06 MED ORDER — DILTIAZEM HCL ER COATED BEADS 360 MG PO CP24
360.0000 mg | ORAL_CAPSULE | Freq: Every day | ORAL | Status: DC
  Administered 2013-03-06: 360 mg via ORAL
  Filled 2013-03-06 (×2): qty 1

## 2013-03-06 MED ORDER — APIXABAN 5 MG PO TABS
5.0000 mg | ORAL_TABLET | Freq: Two times a day (BID) | ORAL | Status: DC
  Administered 2013-03-06: 5 mg via ORAL
  Filled 2013-03-06 (×3): qty 1

## 2013-03-06 NOTE — Progress Notes (Signed)
Patient ID: [name], male   DOB: [birth year], 73 y.o.   MRN: [id number] 1 Day Post-Op  Subjective: Pt feels well today.  Minimal pain.  In a fib but controlled now off IV drip  Objective: Vital signs in last 24 hours: Temp:  [95.5 F (35.3 C)-98.2 F (36.8 C)] 98.2 F (36.8 C) (02/04 0700) Pulse Rate:  [31-162] 71 (02/04 0700) Resp:  [15-33] 15 (02/04 0700) BP: (105-163)/(50-95) 139/75 mmHg (02/04 0700) SpO2:  [89 %-100 %] 97 % (02/04 0700) Weight:  [187 lb 9.8 oz (85.1 kg)] 187 lb 9.8 oz (85.1 kg) (02/03 1530) Last BM Date: 03/04/13  Intake/Output from previous day: 02/03 0701 - 02/04 0700 In: 2557.4 [P.O.:580; I.V.:1877.4; IV Piggyback:100] Out: 2675 [Urine:2675] Intake/Output this shift: Total I/O In: 50 [I.V.:50] Out: 350 [Urine:350]  PE: Skin: wound to LLE is much cleaner today with some good beefy red tissue.  Small necrotic edge around 6 oclock, but otherwise looks great.  No bleeding Heart: irregular  Lab Results:   Recent Labs  03/04/13 1943  WBC 9.3  HGB 13.2  HCT 38.9*  PLT 176   BMET  Recent Labs  03/04/13 1943 03/06/13 0218  NA 140 139  K 4.2 4.2  CL 99 98  CO2 26 30  GLUCOSE 128* 86  BUN 31* 22  CREATININE 1.87* 1.75*  CALCIUM 9.5 8.7   PT/INR  Recent Labs  03/04/13 1943  LABPROT 12.2  INR 0.92   CMP     Component Value Date/Time   NA 139 03/06/2013 0218   K 4.2 03/06/2013 0218   CL 98 03/06/2013 0218   CO2 30 03/06/2013 0218   GLUCOSE 86 03/06/2013 0218   BUN 22 03/06/2013 0218   CREATININE 1.75* 03/06/2013 0218   CALCIUM 8.7 03/06/2013 0218   PROT 7.2 03/04/2013 1943   ALBUMIN 3.5 03/04/2013 1943   AST 17 03/04/2013 1943   ALT 12 03/04/2013 1943   ALKPHOS 90 03/04/2013 1943   BILITOT 0.4 03/04/2013 1943   GFRNONAA 37* 03/06/2013 0218   GFRAA 43* 03/06/2013 0218   Lipase     Component Value Date/Time   LIPASE 34 04/02/2010 1053       Studies/Results: No results found.  Anti-infectives: Anti-infectives   Start     Dose/Rate Route  Frequency Ordered Stop   03/05/13 1245  piperacillin-tazobactam (ZOSYN) IVPB 3.375 g     3.375 g 100 mL/hr over 30 Minutes Intravenous To Surgery 03/05/13 1239 03/05/13 1358   03/05/13 1200  piperacillin-tazobactam (ZOSYN) IVPB 3.375 g     3.375 g 12.5 mL/hr over 240 Minutes Intravenous 3 times per day 03/05/13 1013     03/05/13 1140  ceFAZolin (ANCEF) 2-3 GM-% IVPB SOLR    Comments:  Ardine Eng   : cabinet override      03/05/13 1140 03/05/13 2344   03/04/13 2200  clindamycin (CLEOCIN) IVPB 600 mg  Status:  Discontinued     600 mg 100 mL/hr over 30 Minutes Intravenous 3 times per day 03/04/13 1851 03/05/13 0937       Assessment/Plan  1. Cat bite with LLE wound, POD 1, s/p debridement in OR 2. A fib Patient Active Problem List   Diagnosis Date Noted  . Atrial fibrillation 03/05/2013  . Necrotic wound of left leg 03/04/2013  . Animal bite wound 03/04/2013  . Cat bite of multiple sites 02/20/2013  . Peripheral edema 11/02/2012  . AF (atrial fibrillation) 09/09/2012  . Chronic renal insufficiency, stage III (  moderate) 08/17/2012  . Atrial fibrillation with RVR 08/16/2012  . Chronic anticoagulation - Eliquis 08/16/2012  . Sleep apnea, obstructive 07/24/2012  . COPD (chronic obstructive pulmonary disease) 07/02/2009  . COLONIC POLYPS 07/01/2009  . HYPERTENSION 07/01/2009  . CAD- RCA PCI '03 07/01/2009   Plan: 1. Will transfer to the floor on tele.  He is off his cardizem drip, only on oral meds.  he should remain on tele 2. NS WD dressing changes to LLE wound BID.  ? VAC 3. No bleeding this am.  Will restart eliquis tonight at 2000. 4. Zosyn D2, probable switch to oral tomorrow.   LOS: 2 days    See Beharry E 03/06/2013, 8:14 AM Pager: [phone]

## 2013-03-06 NOTE — Progress Notes (Signed)
Agree with above 

## 2013-03-06 NOTE — Progress Notes (Signed)
VASCULAR LAB PRELIMINARY  ARTERIAL  ABI completed:    RIGHT    LEFT    PRESSURE WAVEFORM  PRESSURE WAVEFORM  BRACHIAL 139 Triphasic BRACHIAL 138 Triphasic  DP 141 Triphasic DP 141 Triphasic  PT 140 Triphasic PT 146 Triphasic    RIGHT LEFT  ABI 1.01 1.05   ABIs and Doppler waveforms are within normal limits bilaterally at rest.  Ladon Heney, RVS  03/06/2013, 4:54 PM

## 2013-03-06 NOTE — Progress Notes (Signed)
    73 year old man with coronary disease (status post PCI of the RCA in 2003, social without haven't totally occluded RCA at the stent in 2003 - status post PTCA), along with chronic atrial fibrillation, COPD/emphysema, hypertension, hyperlipidemia and OSA on CPAP. Cardiology was asked to consult by the surgical service for management of atrial fibrillation following I&D of his cat bite in the left lower extremity. He had rapid A. fib initially postop. Little affect with esmolol, treated with IV diltiazem overnight and rate control. Converted to by mouth dose of diltiazem.  Subjective:  Feels much better this morning with no major complaints. He does not notice heart rate irregular or fast.  Objective:  Vital Signs in the last 24 hours: Temp:  [97.7 F (36.5 C)-98.2 F (36.8 C)] 98 F (36.7 C) (02/04 2109) Pulse Rate:  [66-91] 66 (02/04 2109) Resp:  [15-29] 20 (02/04 2109) BP: (105-150)/(50-84) 107/66 mmHg (02/04 2109) SpO2:  [91 %-97 %] 97 % (02/04 2109)  Intake/Output from previous day: 02/03 0701 - 02/04 0700 In: 2557.4 [P.O.:580; I.V.:1877.4; IV Piggyback:100] Out: 2675 [Urine:2675] Intake/Output from this shift:   Physical Exam: General appearance: alert, cooperative, appears stated age, no distress and Normal mood and affect. Well-appearing Neck: no adenopathy, no carotid bruit, no JVD and supple, symmetrical, trachea midline Lungs: clear to auscultation bilaterally, normal percussion bilaterally and Nonlabored Heart: irregularly irregular rhythm and Normal S1-S2. Regular rate. No M./R./G. heard. Abdomen: soft, non-tender; bowel sounds normal; no masses,  no organomegaly Extremities: extremities normal, atraumatic, no cyanosis or edema and Left foot and soft brace/cast with dressing in place Pulses: 2+ and symmetric  Lab Results:  Recent Labs  03/04/13 1943  WBC 9.3  HGB 13.2  PLT 176    Recent Labs  03/04/13 1943 03/06/13 0218  NA 140 139  K 4.2 4.2  CL 99 98    CO2 26 30  GLUCOSE 128* 86  BUN 31* 22  CREATININE 1.87* 1.75*   No results found for this basename: TROPONINI, CK, MB,  in the last 72 hours Hepatic Function Panel  Recent Labs  03/04/13 1943  PROT 7.2  ALBUMIN 3.5  AST 17  ALT 12  ALKPHOS 90  BILITOT 0.4   No results found for this basename: CHOL,  in the last 72 hours No results found for this basename: PROTIME,  in the last 72 hours  Imaging:  Cardiac Studies: To monitor his ECG showed A. fib with controlled rate  Assessment/Plan:   Principal Problem:   Animal bite wound Active Problems:   Atrial fibrillation with RVR   Chronic atrial fibrillation   HYPERTENSION   CAD- RCA PCI '03   COPD (chronic obstructive pulmonary disease)   Sleep apnea, obstructive   Chronic anticoagulation - Eliquis   Chronic renal insufficiency, stage III (moderate)   Cat bite of multiple sites  Atrial fibrillation rate is now well controlled on oral medications. He is actually totally stable from a cardiac standpoint, back on Eliquis for anticoagulation. Would continue with standing dose of beta blocker and calcium channel blocker. Okay to transfer to telemetry. Please contact us on a stable for discharge we'll arrange followup with Dr. Claiborne Billings.    LOS: 2 days    Cynthie Garmon W 03/06/2013, 9:54 PM

## 2013-03-06 NOTE — Progress Notes (Signed)
Patient transferred to [facility] room 27. S/P Left LE I&D. Dressing intact. Oriented to room. Call bell in reach.

## 2013-03-07 LAB — WOUND CULTURE

## 2013-03-07 MED ORDER — OXYCODONE-ACETAMINOPHEN 5-325 MG PO TABS: 1.0000 | ORAL_TABLET | Freq: Four times a day (QID) | ORAL | Status: DC | PRN

## 2013-03-07 MED ORDER — AMOXICILLIN-POT CLAVULANATE 875-125 MG PO TABS: 1.0000 | ORAL_TABLET | Freq: Two times a day (BID) | ORAL | Status: DC

## 2013-03-07 NOTE — Progress Notes (Signed)
Patient discharged to home with instructions. 

## 2013-03-07 NOTE — Discharge Instructions (Signed)
YOU DO NOT NEED TO FOLLOW UP WITH [facility]  PLEASE FOLLOW UP WITH YOUR WOUND CARE CENTER FOR FURTHER CARE OF YOUR WOUND

## 2013-03-07 NOTE — Discharge Summary (Signed)
Physician Discharge Summary  [name] [id number] DOB: 02-06-1940 DOA: 03/04/2013  PCP: Horatio Pel, MD  Consultation: none  Admit date: 03/04/2013 Discharge date: 03/07/2013  Recommendations for Outpatient Follow-up:   Follow-up Information   Follow up with [facility]              On 03/19/2013. (9:30am, arrive at 9:15 for paperwork and check-in)    Contact information:   [address] [place] [address] [phone]      Follow up with Troy Sine, MD On 03/13/2013. (at 9:00 AM)    Specialty:  Cardiology   Contact information:   [address] [place] [address] [phone]      Discharge Diagnoses:  1. Necrotic LLE wound   Surgical Procedure: debridement of 8x6 cm superficial necrotic wound left lower leg  Discharge Condition: stable Disposition: home  Diet recommendation: regular  Filed Weights   03/04/13 1847 03/05/13 1530  Weight: 188 lb 4.4 oz (85.4 kg) 187 lb 9.8 oz (85.1 kg)     Filed Vitals:   03/07/13 0459  BP: 135/76  Pulse: 75  Temp: 97.9 F (36.6 C)  Resp: 20    Hospital Course:  [name] was admitted from [facility] office after being seen by Dr. Hulen Skains for necrotic left lower extremity wound.  He underwent an I&D the following day.  Post operatively, he developed atrial fibrillation with RVR, cardiology was consulted. He was transferred to SDU and started on a cardizem drip, subsequently weaned off and transitioned to oral antiarrhythmic.  Wound was changed BID, continued on IV antibiotics and transitioned to oral antibiotics.  He was restarted on eliquis which he takes for chronic AF.  His vital signs remained stable, labs remained stable.  On POD #2  The patient was tolerated a diet, ambulating, pain well controlled, rate controlled and felt stable for discharge.  He is going home with wound vac and follow up wound center as outpatient.    Physical Exam: General  appearance: alert and oriented. VSS. NAD Resp: clear to auscultation bilaterally  Cardio: S1S1 RRR without murmurs or gallops. No edema. GI: soft round and nontender. +BS x4 quadrants. No organomegaly, hernias or masses.  LLE: wound is clean, mild erythema, no drainage.     Discharge Instructions   Future Appointments Provider Department Dept Phone   03/13/2013 9:00 AM Troy Sine, MD [facility] [phone]   03/28/2013 10:30 AM Kathee Delton, MD [facility] [phone]       Medication List         amoxicillin-clavulanate 875-125 MG per tablet  Commonly known as:  AUGMENTIN  Take 1 tablet by mouth every 12 (twelve) hours.     aspirin 81 MG tablet  Take 81 mg by mouth daily.     atorvastatin 40 MG tablet  Commonly known as:  LIPITOR  1 by mouth at bedtime     budesonide-formoterol 160-4.5 MCG/ACT inhaler  Commonly known as:  SYMBICORT  Inhale 2 puffs into the lungs 2 (two) times daily.     diltiazem 360 MG 24 hr capsule  Commonly known as:  TIAZAC  Take 360 mg by mouth daily.     ELIQUIS 5 MG Tabs tablet  Generic drug:  apixaban  Take 5 mg by mouth 2 (two) times daily.     hydrochlorothiazide 12.5 MG capsule  Commonly known as:  MICROZIDE  Take 1 capsule (12.5 mg total) by  mouth daily.     isosorbide mononitrate 60 MG 24 hr tablet  Commonly known as:  IMDUR  Take 30 mg by mouth daily.     multivitamin with minerals tablet  Take 1 tablet by mouth daily.     Nebivolol HCl 20 MG Tabs  Take 0.5-1 tablets by mouth See admin instructions. TAKE 1 TABLET IN THE AM AND 1 /2 TABLET IN THE PM     oxyCODONE-acetaminophen 5-325 MG per tablet  Commonly known as:  PERCOCET/ROXICET  Take 1 tablet by mouth every 6 (six) hours as needed for moderate pain.     tiotropium 18 MCG inhalation capsule  Commonly known as:  SPIRIVA  Place 18 mcg into inhaler and inhale daily.           Follow-up Information   Follow up with [facility]              On 03/19/2013. (9:30am, arrive at 9:15 for paperwork and check-in)    Contact information:   [address] [place] [address] [phone]      Follow up with Troy Sine, MD On 03/13/2013. (at 9:00 AM)    Specialty:  Cardiology   Contact information:   [address] [place] [address] [phone]        The results of significant diagnostics from this hospitalization (including imaging, microbiology, ancillary and laboratory) are listed below for reference.    Significant Diagnostic Studies: No results found.  Microbiology: Recent Results (from the past 240 hour(s))  WOUND CULTURE     Status: None   Collection Time    03/04/13  3:54 PM      Result Value Range Status   Gram Stain Few   Final   Gram Stain WBC present-both PMN and Mononuclear   Final   Gram Stain Rare Squamous Epithelial Cells Present   Final   Gram Stain Abundant GRAM POSITIVE COCCI IN PAIRS   Final   Gram Stain Moderate Gram Positive Rods   Final   Gram Stain Moderate Gram Negative Rods   Final   Organism ID, Bacteria PASTEURELLA SPECIES   Final   Comment: Usually susceptible to penicillin and other beta     lactam agents,quinolones,macrolides and     tetracyclines.  SURGICAL PCR SCREEN     Status: None   Collection Time    03/04/13 10:48 PM      Result Value Range Status   MRSA, PCR NEGATIVE  NEGATIVE Final   Staphylococcus aureus NEGATIVE  NEGATIVE Final   Comment:            The Xpert SA Assay (FDA     approved for NASAL specimens     in patients over 85 years of age),     is one component of     a comprehensive surveillance     program.  Test performance has     been validated by [employer] for patients greater     than or equal to 78 year old.     It is not intended     to diagnose infection nor to     guide or monitor treatment.     Labs: Basic Metabolic Panel:  Recent Labs Lab 03/04/13 1943  03/06/13 0218  NA 140 139  K 4.2 4.2  CL 99 98  CO2 26 30  GLUCOSE 128* 86  BUN 31* 22  CREATININE 1.87* 1.75*  CALCIUM 9.5 8.7   Liver Function Tests:  Recent Labs Lab 03/04/13 1943  AST 17  ALT 12  ALKPHOS 90  BILITOT 0.4  PROT 7.2  ALBUMIN 3.5   No results found for this basename: LIPASE, AMYLASE,  in the last 168 hours No results found for this basename: AMMONIA,  in the last 168 hours CBC:  Recent Labs Lab 03/04/13 1943  WBC 9.3  NEUTROABS 6.4  HGB 13.2  HCT 38.9*  MCV 87.0  PLT 176   Cardiac Enzymes: No results found for this basename: CKTOTAL, CKMB, CKMBINDEX, TROPONINI,  in the last 168 hours BNP: BNP (last 3 results)  Recent Labs  08/06/12 0430 08/16/12 2115 03/05/13 1005  PROBNP 922.0* 1803.0* 5009.0*   CBG: No results found for this basename: GLUCAP,  in the last 168 hours  Principal Problem:   Animal bite wound Active Problems:   HYPERTENSION   CAD- RCA PCI '03   COPD (chronic obstructive pulmonary disease)   Sleep apnea, obstructive   Atrial fibrillation with RVR   Chronic anticoagulation - Eliquis   Chronic renal insufficiency, stage III (moderate)   Cat bite of multiple sites   Chronic atrial fibrillation/permanent   Signed:  Adamaris King, ANP-BC

## 2013-03-11 ENCOUNTER — Encounter (HOSPITAL_COMMUNITY): Payer: Self-pay | Admitting: General Surgery

## 2013-03-13 ENCOUNTER — Telehealth: Payer: Self-pay | Admitting: Cardiovascular Disease

## 2013-03-13 ENCOUNTER — Encounter: Payer: Self-pay | Admitting: Cardiovascular Disease

## 2013-03-13 ENCOUNTER — Ambulatory Visit (INDEPENDENT_AMBULATORY_CARE_PROVIDER_SITE_OTHER): Payer: Medicare Other | Admitting: Cardiovascular Disease

## 2013-03-13 VITALS — BP 118/78 | HR 114 | Ht 70.0 in | Wt 182.8 lb

## 2013-03-13 DIAGNOSIS — F17200 Nicotine dependence, unspecified, uncomplicated: Secondary | ICD-10-CM

## 2013-03-13 DIAGNOSIS — I4891 Unspecified atrial fibrillation: Secondary | ICD-10-CM

## 2013-03-13 DIAGNOSIS — E785 Hyperlipidemia, unspecified: Secondary | ICD-10-CM

## 2013-03-13 DIAGNOSIS — IMO0001 Reserved for inherently not codable concepts without codable children: Secondary | ICD-10-CM

## 2013-03-13 DIAGNOSIS — R609 Edema, unspecified: Secondary | ICD-10-CM

## 2013-03-13 DIAGNOSIS — I2510 Atherosclerotic heart disease of native coronary artery without angina pectoris: Secondary | ICD-10-CM

## 2013-03-13 DIAGNOSIS — G4733 Obstructive sleep apnea (adult) (pediatric): Secondary | ICD-10-CM

## 2013-03-13 DIAGNOSIS — W5501XA Bitten by cat, initial encounter: Secondary | ICD-10-CM

## 2013-03-13 DIAGNOSIS — Z79899 Other long term (current) drug therapy: Secondary | ICD-10-CM

## 2013-03-13 DIAGNOSIS — I10 Essential (primary) hypertension: Secondary | ICD-10-CM

## 2013-03-13 DIAGNOSIS — T148XXA Other injury of unspecified body region, initial encounter: Secondary | ICD-10-CM

## 2013-03-13 MED ORDER — NEBIVOLOL HCL 20 MG PO TABS: 1.0000 | ORAL_TABLET | Freq: Two times a day (BID) | ORAL | Status: DC

## 2013-03-13 MED ORDER — FUROSEMIDE 40 MG PO TABS: ORAL_TABLET | ORAL | Status: DC

## 2013-03-13 NOTE — Telephone Encounter (Signed)
Please call-concerning new medicine that Dr Claiborne Billings put him on today. She needs a new order.

## 2013-03-13 NOTE — Patient Instructions (Signed)
Your physician has recommended you make the following change in your medication: STOP the HCTZ. Increase the bystolic to  20 mg twice daily., increase the furosemide to 40 mg daily.  Your physician recommends that you return for lab work in: 2 weeks.  Your physician recommends that you schedule a follow-up appointment in: 3 months.

## 2013-03-13 NOTE — Progress Notes (Signed)
Patient ID: [name], male   DOB: 08-May-1940, 73 y.o.   MRN: [id number]       HPI: [name], is a 73 y.o. male who presents for cardiology  followup evaluation.  [name] has established coronary artery disease and underwent stenting of his right coronary artery in 2003 by Dr. Glade Lloyd. I first saw him in 2011 when he had symptoms suggestive of CAD progression.  Repeat catheterization  showed total occlusion of the RCA within the previously stented segment.  I was able to successfully reopen this vessel and also performed PTCA distal to his previously placed stents. He did have concomitant CAD of the LAD and circumflex vessels. He has a documented infrarenal abdominal aortic aneurysm. He has ongoing tobacco use with documented COPD/emphysema, history of hypertension hyperlipidemia and obstructive sleep apnea. He was found to be in atrial fibrillation in March 2014 at that time was started on Eliquis anticoagulation and rate control medications. A cardioversion was performed on 06/07/2012 with restoration of sinus rhythm. Unfortunately, he did not hold and develop recurrent AF. Due to his COPD I was concerned about initiating amiodarone and  then started him on Multaq 400 mg twice a day and further titrated his Bystolic.  He was set up for a an attempt at another cardioversion in July but prior to this was hospitalized with a COPD exacerbation and his elective cardioversion was canceled. However, he then presented to the pulmonology office for followup evaluation and was noted to be in atrial fibrillation with rapid ventricular response which then led to her another [facility] hospitalization. At that time he was then started on IV Cardizem for rate control. While on Multaq, another attempt at cardioversion was done by Dr. Debara Pickett which was unsuccessful and the thought process was for him to stay in permanent atrial fibrillation. When I last saw him in the office on 09/05/2012 we elected to keep him in  atrial fibrillation. He did have leg edema I started him on HCTZ 12.5 mg. He is unaware of his ventricular rate. He denies chest pain. He states he has significantly reduced his tobacco use and also started using electronic cigarettes. He also has obstructive sleep apnea on CPAP.   Approximately 3 weeks ago, he was bitten by his cat. This resulted in significant infection and development of necrotic tissue. His eloquence was held, he was treated with antibiotics and he underwent surgical debridement on 8 x 6 cm superficial necrotic wound in his left lower leg leg done by Dr. Serita Grammes. He is still undergoing drainage of this. He does note leg swelling. He is back on eliquis anticoagulation. He denies recent bleeding. He denies recent anginal symptoms. Unfortunately he is still smoking cigarettes.   Past Medical History  Diagnosis Date  . COPD (chronic obstructive pulmonary disease)   . Active smoker   . Sleep apnea     managed by Dr. Shelia Media  . Obesity   . Coronary artery disease   . Benign neoplasm of colon   . Atrial fibrillation   . Pyelonephritis, unspecified   . Other and unspecified hyperlipidemia   . Personal history of peptic ulcer disease   . Elevated prostate specific antigen (PSA)   . Diaphragmatic hernia without mention of obstruction or gangrene   . Esophageal reflux   . Tobacco use disorder   . Unspecified essential hypertension   . Peripheral vascular disease     Past Surgical History  Procedure Laterality Date  . Coronary artery stent  2003  . Cardioversion N/A 06/07/2012    Procedure: CARDIOVERSION;  Surgeon: Troy Sine, MD;  Location: [facility];  Service: Cardiovascular;  Laterality: N/A;  . Cardioversion N/A 08/17/2012    Procedure: CARDIOVERSION;  Surgeon: Pixie Casino, MD;  Location: [facility];  Service: Cardiovascular;  Laterality: N/A;  . Coronary angioplasty    . Appendectomy    . I&d extremity Left 03/05/2013    Procedure: IRRIGATION AND  DEBRIDEMENT EXTREMITY;  Surgeon: Rolm Bookbinder, MD;  Location: [facility];  Service: General;  Laterality: Left;    Allergies  Allergen Reactions  . Ranitidine Hcl     REACTION: throat swelling, difficulty breathing  . Beta Adrenergic Blockers     REACTION: sexual side effects  . Ramipril     REACTION: throat swelling  . Sulfonamide Derivatives     REACTION: childhood reation of hematuria  . Ticlopidine Hcl     REACTION: swelling    Current Outpatient Prescriptions  Medication Sig Dispense Refill  . apixaban (ELIQUIS) 5 MG TABS tablet Take 5 mg by mouth 2 (two) times daily.      Marland Kitchen aspirin 81 MG tablet Take 81 mg by mouth daily.        Marland Kitchen atorvastatin (LIPITOR) 40 MG tablet 1 by mouth at bedtime      . budesonide-formoterol (SYMBICORT) 160-4.5 MCG/ACT inhaler Inhale 2 puffs into the lungs 2 (two) times daily.  1 Inhaler  12  . diltiazem (TIAZAC) 360 MG 24 hr capsule Take 360 mg by mouth daily.      . isosorbide mononitrate (IMDUR) 60 MG 24 hr tablet Take 30 mg by mouth daily.      . Multiple Vitamins-Minerals (MULTIVITAMIN WITH MINERALS) tablet Take 1 tablet by mouth daily.        . Nebivolol HCl 20 MG TABS Take 1 tablet (20 mg total) by mouth 2 (two) times daily.  60 tablet  11  . oxyCODONE-acetaminophen (PERCOCET/ROXICET) 5-325 MG per tablet Take 1 tablet by mouth every 6 (six) hours as needed for moderate pain.  30 tablet  0  . tiotropium (SPIRIVA) 18 MCG inhalation capsule Place 18 mcg into inhaler and inhale daily.        . furosemide (LASIX) 40 MG tablet Take 1 tablet daily  30 tablet  11  . pantoprazole (PROTONIX) 40 MG tablet Take 1 tablet by mouth daily.       No current facility-administered medications for this visit.    History   Social History  . Marital Status: Married    Spouse Name: N/A    Number of Children: 2  . Years of Education: N/A   Occupational History  . retired Designer, industrial/product   Social History Main Topics  . Smoking status: Current  Every Day Smoker -- 1.00 packs/day for 55 years    Types: Cigarettes  . Smokeless tobacco: Never Used     Comment: started at age 31.  prev was 2 ppd.   . Alcohol Use: 1.8 oz/week    3 Shots of liquor per week  . Drug Use: No  . Sexual Activity: Not on file   Other Topics Concern  . Not on file   Social History Narrative  . No narrative on file    ROS is negative for fevers, chills or night sweats. He denies visual symptoms. There are no hearing changes.  He denies tremors.  He denies any awareness of increased heart rate and is unaware  that his heart rhythm is irregular. He denies recent wheezing. He denies bleeding. He denies chest pressure. He still smokes cigarettes. He denies bleeding. There is 1+ pedal edema in the right lower extremity and at least 1+ or greater in the left lower extremity around his wound. There is no claudication. There is no diabetes. He sleeps with CPAP. Other comprehensive 14 point system review is negative.  PE BP 118/78  Pulse 114  Ht 5\' 10"  (1.778 m)  Wt 182 lb 12.8 oz (82.918 kg)  BMI 26.23 kg/m2  General: Alert, oriented, no distress.  Skin: normal turgor, no rashes HEENT: Normocephalic, atraumatic. Pupils round and reactive; sclera anicteric;no lid lag. No central Nose without nasal septal hypertrophy Mouth/Parynx benign; Mallinpatti scale 3 Neck: No JVD, no carotid briuts Lungs: Diffusely decreased breath sounds due to long-standing tobacco use.; no wheezing or rales Heart: Irregularly irregular with a ventricular rate at approximately 110 beats per minute, s1 s2 normal 1/6 systolic murmur Abdomen: soft, nontender; no hepatosplenomehaly, BS+; abdominal aorta nontender and not dilated by palpation. Pulses 2+ Extremities: 1+ ankle edema bilaterally with draining in his left wound; no clubbing cyanosis, Homan's sign negative  Neurologic: grossly nonfocal Psychologic: Normal affect and mood   ECG: Atrial fibrillation at 114 per minute with  nonspecific ST changes.  LABS:  BMET    Component Value Date/Time   NA 139 03/06/2013 0218   K 4.2 03/06/2013 0218   CL 98 03/06/2013 0218   CO2 30 03/06/2013 0218   GLUCOSE 86 03/06/2013 0218   BUN 22 03/06/2013 0218   CREATININE 1.75* 03/06/2013 0218   CALCIUM 8.7 03/06/2013 0218   GFRNONAA 37* 03/06/2013 0218   GFRAA 43* 03/06/2013 0218     Hepatic Function Panel     Component Value Date/Time   PROT 7.2 03/04/2013 1943   ALBUMIN 3.5 03/04/2013 1943   AST 17 03/04/2013 1943   ALT 12 03/04/2013 1943   ALKPHOS 90 03/04/2013 1943   BILITOT 0.4 03/04/2013 1943     CBC    Component Value Date/Time   WBC 9.3 03/04/2013 1943   RBC 4.47 03/04/2013 1943   HGB 13.2 03/04/2013 1943   HCT 38.9* 03/04/2013 1943   PLT 176 03/04/2013 1943   MCV 87.0 03/04/2013 1943   MCH 29.5 03/04/2013 1943   MCHC 33.9 03/04/2013 1943   RDW 15.2 03/04/2013 1943   LYMPHSABS 1.7 03/04/2013 1943   MONOABS 1.1* 03/04/2013 1943   EOSABS 0.1 03/04/2013 1943   BASOSABS 0.0 03/04/2013 1943     BNP    Component Value Date/Time   PROBNP 5009.0* 03/05/2013 1005    Lipid Panel  No results found for this basename: chol,  trig,  hdl,  cholhdl,  vldl,  ldlcalc     RADIOLOGY: Dg Chest Port 1 View  08/16/2012   *RADIOLOGY REPORT*  Clinical Data: Short of breath  PORTABLE CHEST - 1 VIEW  Comparison: 08/03/2012  Findings: COPD with hyperinflation of the lungs.  Negative for pneumonia.  Negative for heart failure or effusion.  IMPRESSION: No acute abnormality.  COPD.   Original Report Authenticated By: Carl Best, M.D.      ASSESSMENT AND PLAN: Mr. Amedeo Plenty is a 73 year old gentleman with established coronary artery disease and now has permanent atrial fibrillation for which she's been on eliquis anticoagulation therapy. He recently developed a CAT bite leading to infection in development of necrotic tissue requiring recent debridement. He is now back on his anticoagulation therapy. His atrial fibrillation rate  is fast today. He is on diltiazem 360 mg in  addition to Bystolic 20 mg in the morning and 10 mg  at night. He has documented normal systolic function on his last echo Doppler study. She used to have leg swelling bilaterally. I have recommended that he discontinue his hydrochlorothiazide. I will titrate his furosemide and he will take his 40 mg daily. I will further titrate his Bystolic to 20 mg twice a day for more effective beta-blockade. He's not having signs of wheezing and seems to tolerate this with reference to his cardioselective A. Continues to have significant increased rate, he may require the addition of Lanoxin to his medical regimen for additional rate control. Repeat laboratory obtained in 2 weeks. I will see him in 2-3 months for cardiology followup.  Troy Sine, MD, [facility]  03/13/2013 11:11 AM

## 2013-03-13 NOTE — Telephone Encounter (Signed)
Spoke to Azzie Roup RN- SHE NEED AN VERBAL ORDER FOR  INSTRUCTION GIVEN ON 03/13/13.  V/O FOR BMP LABS IN 2 WEEKS AND MEDICATION CHANGE.

## 2013-03-19 ENCOUNTER — Encounter (HOSPITAL_BASED_OUTPATIENT_CLINIC_OR_DEPARTMENT_OTHER): Payer: Medicare Other | Attending: General Surgery

## 2013-03-19 DIAGNOSIS — E119 Type 2 diabetes mellitus without complications: Secondary | ICD-10-CM | POA: Insufficient documentation

## 2013-03-19 DIAGNOSIS — B9689 Other specified bacterial agents as the cause of diseases classified elsewhere: Secondary | ICD-10-CM | POA: Insufficient documentation

## 2013-03-19 DIAGNOSIS — L97809 Non-pressure chronic ulcer of other part of unspecified lower leg with unspecified severity: Secondary | ICD-10-CM | POA: Insufficient documentation

## 2013-03-19 DIAGNOSIS — I10 Essential (primary) hypertension: Secondary | ICD-10-CM | POA: Insufficient documentation

## 2013-03-19 DIAGNOSIS — Z79899 Other long term (current) drug therapy: Secondary | ICD-10-CM | POA: Insufficient documentation

## 2013-03-19 DIAGNOSIS — I4891 Unspecified atrial fibrillation: Secondary | ICD-10-CM | POA: Insufficient documentation

## 2013-03-19 DIAGNOSIS — A280 Pasteurellosis: Secondary | ICD-10-CM | POA: Insufficient documentation

## 2013-03-28 ENCOUNTER — Ambulatory Visit: Payer: Medicare Other | Admitting: Pulmonary Disease

## 2013-03-29 NOTE — Progress Notes (Signed)
[facility]  NAME:  [name], [name] NO.:  [id number]  MEDICAL RECORD NO.:  [id number]      DATE OF BIRTH:  1940/02/23  PHYSICIAN:  Judene Companion, M.D.           VISIT DATE:                                  OFFICE VISIT   This is a 73 year old Caucasian male who comes in here being sent by Dr. Hulen Skains because he had a cat bite to the posterior aspect of his left leg, which later developed into a large abscess.  He had to be taken to the hospital, put on IV antibiotics, and a large debridement of this abscess tissue was done.  He has now got about a 6 or 7 cm ulcer on the posterior aspect of his left leg that has pretty good granulation tissue.  Apparently this cultured out Pasteurella and gram-positive coccus.  He was treated with IV Doxycycline and clindamycin, which he is still taking orally.  He is a diabetic.  He has atrial fibrillation and hypertension.  MEDICINES:  Isosorbide, hydrochlorothiazide, Eliquis, Tiazac, aspirin, and atorvastatin.  He comes to [place] today with a blood pressure of 126/80, pulse 140, temperature 97, he weighs 190 pounds, he is 5 feet and 11 inches.  We elected to treat this wound with wound VAC and we are going to put collagen on the wound and cover that with black sponge.  I am planning on doing a skin graft on him when this wound gets cleaned up the best it can.  DIAGNOSES: 1. Deep ulcer of the posterior aspect of his left leg post abscess     from a cat bite. 2. Hypertension. 3. Atrial fibrillation.     Judene Companion, M.D.     PP/MEDQ  D:  03/29/2013  T:  03/29/2013  Job:  [id number]

## 2013-04-01 ENCOUNTER — Telehealth (INDEPENDENT_AMBULATORY_CARE_PROVIDER_SITE_OTHER): Payer: Self-pay

## 2013-04-01 DIAGNOSIS — S81802A Unspecified open wound, left lower leg, initial encounter: Secondary | ICD-10-CM

## 2013-04-01 NOTE — Telephone Encounter (Signed)
Pt calling requesting refill of oxycodone. Pt states he still has wd vac on his leg wd. Pt states he needs pain med for when wd is redressed every other day. No fever. No increase in swelling. Wd vac working well. Pain a level 3-4 at all times. Pt understands that if narcotic is given pt will need to come to office to pick up written rx. Pt advised request will be sent to Dr Donne Hazel and his assistant  for review. Pt can be reached at [phone].

## 2013-04-01 NOTE — Telephone Encounter (Signed)
Will forward to [place].

## 2013-04-01 NOTE — Telephone Encounter (Signed)
He can have another 20 oxycodone.

## 2013-04-02 ENCOUNTER — Telehealth (INDEPENDENT_AMBULATORY_CARE_PROVIDER_SITE_OTHER): Payer: Self-pay

## 2013-04-02 MED ORDER — OXYCODONE-ACETAMINOPHEN 5-325 MG PO TABS: 1.0000 | ORAL_TABLET | Freq: Four times a day (QID) | ORAL | Status: DC | PRN

## 2013-04-02 NOTE — Telephone Encounter (Signed)
Patient wife calling into office to make [place] aware that they had patient's PCP Dr. Shelia Media write a prescription for Oxycodocone, she said they could not wait for our office to write an rx for patient.  RX need's to be voided out.

## 2013-04-02 NOTE — Addendum Note (Signed)
Addended by: Illene Regulus on: 04/02/2013 09:16 AM   Modules accepted: Orders

## 2013-04-02 NOTE — Telephone Encounter (Signed)
Pt calling with questions regarding his appt with the [facility].  I confirmed that it was 04/05/13 at 9:30a.m.

## 2013-04-02 NOTE — Telephone Encounter (Signed)
Shredded the written rx for Oxycodone 5/325mg  #20 that Dr Marlou Starks signed for Dr Donne Hazel. Marked the rx as discontinued in the chart due to pt getting from PCP yesterday.

## 2013-04-02 NOTE — Addendum Note (Signed)
Addended by: Illene Regulus on: 04/02/2013 09:56 AM   Modules accepted: Orders, Medications

## 2013-04-02 NOTE — Telephone Encounter (Signed)
Pt notified to p/u written rx for Oxycodone 5/325mg  #20 signed by  Dr Marlou Starks for Dr Donne Hazel.

## 2013-04-05 ENCOUNTER — Ambulatory Visit: Payer: Medicare Other | Admitting: Pulmonary Disease

## 2013-04-05 ENCOUNTER — Encounter (HOSPITAL_BASED_OUTPATIENT_CLINIC_OR_DEPARTMENT_OTHER): Payer: Medicare Other | Attending: General Surgery

## 2013-04-05 DIAGNOSIS — S91009A Unspecified open wound, unspecified ankle, initial encounter: Principal | ICD-10-CM

## 2013-04-05 DIAGNOSIS — L02419 Cutaneous abscess of limb, unspecified: Secondary | ICD-10-CM | POA: Insufficient documentation

## 2013-04-05 DIAGNOSIS — S81009A Unspecified open wound, unspecified knee, initial encounter: Secondary | ICD-10-CM | POA: Insufficient documentation

## 2013-04-05 DIAGNOSIS — L03119 Cellulitis of unspecified part of limb: Secondary | ICD-10-CM

## 2013-04-05 DIAGNOSIS — S81809A Unspecified open wound, unspecified lower leg, initial encounter: Principal | ICD-10-CM

## 2013-04-05 DIAGNOSIS — W540XXA Bitten by dog, initial encounter: Secondary | ICD-10-CM | POA: Insufficient documentation

## 2013-04-10 ENCOUNTER — Ambulatory Visit (INDEPENDENT_AMBULATORY_CARE_PROVIDER_SITE_OTHER): Payer: Medicare Other | Admitting: Cardiology

## 2013-04-10 ENCOUNTER — Encounter: Payer: Self-pay | Admitting: Cardiology

## 2013-04-10 ENCOUNTER — Telehealth: Payer: Self-pay | Admitting: Cardiovascular Disease

## 2013-04-10 VITALS — BP 110/60 | HR 122 | Ht 71.0 in | Wt 183.0 lb

## 2013-04-10 DIAGNOSIS — I2510 Atherosclerotic heart disease of native coronary artery without angina pectoris: Secondary | ICD-10-CM

## 2013-04-10 DIAGNOSIS — R609 Edema, unspecified: Secondary | ICD-10-CM

## 2013-04-10 DIAGNOSIS — W5501XA Bitten by cat, initial encounter: Secondary | ICD-10-CM

## 2013-04-10 DIAGNOSIS — J449 Chronic obstructive pulmonary disease, unspecified: Secondary | ICD-10-CM

## 2013-04-10 DIAGNOSIS — G4733 Obstructive sleep apnea (adult) (pediatric): Secondary | ICD-10-CM

## 2013-04-10 DIAGNOSIS — F17200 Nicotine dependence, unspecified, uncomplicated: Secondary | ICD-10-CM

## 2013-04-10 DIAGNOSIS — N183 Chronic kidney disease, stage 3 unspecified: Secondary | ICD-10-CM

## 2013-04-10 DIAGNOSIS — T148XXA Other injury of unspecified body region, initial encounter: Secondary | ICD-10-CM

## 2013-04-10 DIAGNOSIS — I4891 Unspecified atrial fibrillation: Secondary | ICD-10-CM

## 2013-04-10 DIAGNOSIS — IMO0001 Reserved for inherently not codable concepts without codable children: Secondary | ICD-10-CM

## 2013-04-10 MED ORDER — DIGOXIN 125 MCG PO TABS: 0.1250 mg | ORAL_TABLET | Freq: Every day | ORAL | Status: DC

## 2013-04-10 MED ORDER — FUROSEMIDE 40 MG PO TABS: 40.0000 mg | ORAL_TABLET | Freq: Two times a day (BID) | ORAL | Status: DC

## 2013-04-10 NOTE — Assessment & Plan Note (Signed)
Continues to smoke.

## 2013-04-10 NOTE — Telephone Encounter (Signed)
Spoke with Dianne from home health care about Joseph [name], she said that she's been treating [name] wound for the past couples weeks and she's been noticing some swelling in his legs, she also notice an irregular heart rate of 130-150 BMP, BP 100/60 but pt didn't c/o of any chest pain.Marland Kitchen Apt was made with Luke/PA at 1:30 pm

## 2013-04-10 NOTE — Progress Notes (Signed)
04/10/2013 [name]   [birth year]  [id number]  Primary Physicia Joseph Pel, MD Primary Cardiologist: Dr [name]  HPI:  Mr. [name] is a 73 y/o, retired Social research officer, government, who has established coronary artery disease and underwent stenting of his right coronary artery in 2003 by Dr. Glade [name].  Repeat catheterization in 2011 showed total occlusion of the RCA within the previously stented segment. Dr [name] successfully reopened this vessel and also performed PTCA distal to his previously placed stents. He did have concomitant CAD of the [name] and circumflex vessels. 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                He was found to be in atrial fibrillation in March 2014 and at that time was started on Eliquis anticoagulation and rate control medications. A cardioversion was performed on 06/07/2012 with restoration of sinus rhythm. Unfortunately, he did not hold and developed recurrent AF. Due to his COPD Dr [name] was concerned about initiating amiodarone and then started him on Multaq 400 mg twice a day and further titrated his Bystolic. He was set up for a an attempt at another cardioversion in [name] 2014 but prior to this was hospitalized with a COPD exacerbation and his elective cardioversion was canceled. While on Multaq, another attempt at cardioversion was done by Dr. Debara [name] which was unsuccessful and the thought process was for him to stay in permanent atrial fibrillation.              Dr [name] adjusted his medications for rate control when he saw him 03/13/13. The pt is here for follow up. He did not bring his medications. I'm not sure he followed Dr [name] instructions. He says he just increased his Lasix to BID last week. He continues to have LE edema. He has a dressing on his Lt leg from a cat bite and subsequent infection requiring hospitalization and I&D in February. He denies any increased SOB.       Current Outpatient Prescriptions  Medication Sig Dispense Refill  . apixaban (ELIQUIS) 5 MG TABS tablet Take 5 mg by mouth 2 (two) times daily.      Marland Kitchen aspirin 81 MG tablet Take 81 mg by mouth daily.        Marland Kitchen atorvastatin (LIPITOR) 40 MG tablet 1 by mouth at bedtime      . budesonide-formoterol (SYMBICORT) 160-4.5 MCG/ACT inhaler Inhale 2 puffs into the lungs 2 (two) times daily.  1 Inhaler  12  . diltiazem (TIAZAC) 360 MG 24 hr capsule Take 360 mg by mouth daily.      . furosemide (LASIX) 40 MG tablet Take 1 tablet (40 mg total) by mouth 2 (two) times daily.  60 tablet  5  . isosorbide mononitrate (IMDUR) 60 MG 24 hr tablet Take 30 mg by mouth daily.      . Multiple Vitamins-Minerals (MULTIVITAMIN WITH MINERALS) tablet Take 1 tablet by mouth daily.        . Nebivolol HCl 20 MG TABS Take 1 tablet (20 mg total) by mouth 2 (two) times daily.  60 tablet  11  . oxyCODONE-acetaminophen (PERCOCET/ROXICET) 5-325 MG per tablet       . pantoprazole (PROTONIX) 40 MG tablet Take 1 tablet by mouth daily.      Marland Kitchen tiotropium (SPIRIVA) 18 MCG inhalation capsule Place 18 mcg into inhaler and inhale daily.        . digoxin (LANOXIN)  0.125 MG tablet Take 1 tablet (0.125 mg total) by mouth daily.  90 tablet  3   No current facility-administered medications for this visit.    Allergies  Allergen Reactions  . Ranitidine Hcl     REACTION: throat swelling, difficulty breathing  . Beta Adrenergic Blockers     REACTION: sexual side effects  . Ramipril     REACTION: throat swelling  . Sulfonamide Derivatives     REACTION: childhood reation of hematuria  . Ticlopidine Hcl     REACTION: swelling    History   Social History  . Marital Status: Married    Spouse Name: N/A    Number of Children: 2  . Years of Education: N/A   Occupational History  . retired Designer, industrial/product   Social History Main Topics  . Smoking status: Current Every Day Smoker -- 1.00 packs/day for 55 years     Types: Cigarettes  . Smokeless tobacco: Never Used     Comment: started at age 26.  prev was 2 ppd.   . Alcohol Use: 1.8 oz/week    3 Shots of liquor per week  . Drug Use: No  . Sexual Activity: Not on file   Other Topics Concern  . Not on file   Social History Narrative  . No narrative on file     Review of Systems: General: negative for chills, fever, night sweats or weight changes.  Cardiovascular: negative for chest pain, dyspnea on exertion, edema, orthopnea, palpitations, paroxysmal nocturnal dyspnea or shortness of breath Dermatological: negative for rash Respiratory: negative for cough or wheezing Urologic: negative for hematuria Abdominal: negative for nausea, vomiting, diarrhea, bright red blood per rectum, melena, or hematemesis Neurologic: negative for visual changes, syncope, or dizziness All other systems reviewed and are otherwise negative except as noted above.    Blood pressure 110/60, pulse 122, height 5\' 11"  (1.803 m), weight 183 lb (83.008 kg).  General appearance: alert, cooperative, no distress, mild distress and mild SOB at rest Lungs: decreased breath sounds Heart: irregularly irregular rhythm Extremities: 1+ edema bilat with dressing on Lt   EKG AF with RVR- 122  ASSESSMENT AND PLAN:   Atrial fibrillation with RVR Rate 120. Sent in today after [facility] noted increased rate.  Peripheral edema Chronic, he says he just increased his Lasix to BID this week (though Dr [name] suggested this LOV)  CAD- RCA PCI '03 No angina  Chronic renal insufficiency, stage III (moderate) Last SCr 1.75  COPD (chronic obstructive pulmonary disease) Chronic Rt heart failure, normal Lt heart function  Sleep apnea, obstructive C-pap  Cat bite of multiple sites Feb 2015- s/p I&D  Smoking Continues to smoke   PLAN  I added Lanoxin 0.125 mg daily and suggested he stop his HCTZ. He will follow up with Dr [name]. He was counsled on smoking cessation.  [name]  KPA-C 04/10/2013 5:10 PM

## 2013-04-10 NOTE — Assessment & Plan Note (Signed)
Cpap

## 2013-04-10 NOTE — Assessment & Plan Note (Signed)
Chronic, he says he just increased his Lasix to BID this week (though Dr Claiborne Billings suggested this [place])

## 2013-04-10 NOTE — Assessment & Plan Note (Signed)
Feb 2015- s/p I&D

## 2013-04-10 NOTE — Assessment & Plan Note (Signed)
Last SCr 1.75

## 2013-04-10 NOTE — Assessment & Plan Note (Signed)
No angina 

## 2013-04-10 NOTE — Patient Instructions (Addendum)
Stop HCTZ (or Microzide). Continue Furosemide twice a day Start Lanoxin 0.125 mg daily Your physician recommends that you schedule a follow-up appointment in: 2-3 weeks with Dr Claiborne Billings Low salt diet No smoking

## 2013-04-10 NOTE — Assessment & Plan Note (Signed)
Chronic Rt heart failure, normal Lt heart function

## 2013-04-10 NOTE — Assessment & Plan Note (Signed)
Rate 120. Sent in today after [facility] noted increased rate.

## 2013-04-15 ENCOUNTER — Telehealth: Payer: Self-pay | Admitting: *Deleted

## 2013-04-15 ENCOUNTER — Telehealth: Payer: Self-pay | Admitting: Cardiovascular Disease

## 2013-04-15 NOTE — Telephone Encounter (Signed)
Home care nurse called and wanted verify furosemide dose, informed that last dose listed was 40 mg bid

## 2013-04-15 NOTE — Telephone Encounter (Signed)
Home care nurse called and wanted to verify furosemide dose, which is 40 mg bid, information given

## 2013-04-17 ENCOUNTER — Telehealth: Payer: Self-pay | Admitting: Cardiovascular Disease

## 2013-04-17 NOTE — Telephone Encounter (Signed)
Wants to know if Dr Claiborne Billings received a fax on 3-12 for Skilled Nursing? If so , we need this back by Friday before 5.

## 2013-04-17 NOTE — Telephone Encounter (Signed)
Message forwarded to Dr. Kelly/Wanda, CMA.  

## 2013-04-17 NOTE — Telephone Encounter (Signed)
Message forwarded to Wanda, CMA.  

## 2013-04-19 NOTE — Telephone Encounter (Signed)
Faxed orders to [facility]. 

## 2013-04-24 ENCOUNTER — Ambulatory Visit (INDEPENDENT_AMBULATORY_CARE_PROVIDER_SITE_OTHER): Payer: Medicare Other | Admitting: Pulmonary Disease

## 2013-04-24 ENCOUNTER — Encounter: Payer: Self-pay | Admitting: Pulmonary Disease

## 2013-04-24 VITALS — BP 128/86 | HR 98 | Temp 97.8°F | Ht 71.0 in | Wt 172.6 lb

## 2013-04-24 DIAGNOSIS — J449 Chronic obstructive pulmonary disease, unspecified: Secondary | ICD-10-CM

## 2013-04-24 DIAGNOSIS — J4489 Other specified chronic obstructive pulmonary disease: Secondary | ICD-10-CM

## 2013-04-24 NOTE — Assessment & Plan Note (Signed)
The patient is staying near his usual baseline, but unfortunately continues to smoke. I have stressed to him again the importance of total smoking cessation, and asked him to stay on his current bronchodilator regimen. He is not wearing oxygen with exertional activities, and I have asked him to consider doing this in order to improve his endurance. I will see him back in 6 months, and also as needed

## 2013-04-24 NOTE — Patient Instructions (Signed)
Stop smoking.  This is the most important treatment for your lung disease. Stay on symbicort and spiriva.  Use your albuterol for rescue since it has a much faster onset. Wear your oxygen during the day with activities, and this may improve your endurance. followup with me again in 49mos.

## 2013-04-24 NOTE — Progress Notes (Signed)
   Subjective:    Patient ID: [name], male    DOB: 27-Jun-1940, 73 y.o.   MRN: [id number]  HPI The patient comes in today for followup of his severe COPD. He is staying on his bronchodilator regimen, but unfortunately continues to smoke. He is also using his Symbicort for rescue, and I have cautioned him about this. He is wearing oxygen during sleep through his CPAP machine, but is not wearing it with exertional activities. The patient feels that his breathing is near his usual baseline, and he has not had a recent acute exacerbation.   Review of Systems  Constitutional: Negative for fever and unexpected weight change.  HENT: Negative for congestion, dental problem, ear pain, nosebleeds, postnasal drip, rhinorrhea, sinus pressure, sneezing, sore throat and trouble swallowing.   Eyes: Negative for redness and itching.  Respiratory: Negative for cough, chest tightness, shortness of breath and wheezing.   Cardiovascular: Negative for palpitations and leg swelling.  Gastrointestinal: Negative for nausea and vomiting.  Genitourinary: Negative for dysuria.  Musculoskeletal: Negative for joint swelling.  Skin: Negative for rash.  Neurological: Negative for headaches.  Hematological: Does not bruise/bleed easily.  Psychiatric/Behavioral: Negative for dysphoric mood. The patient is not nervous/anxious.        Objective:   Physical Exam Thin male in no acute distress Nose without purulence or discharge noted Neck without lymphadenopathy or thyromegaly Chest with very diminished breath sounds bilaterally, no active wheezing Cardiac exam with regular rate and rhythm Lower extremities with 1+ edema, no cyanosis Alert and oriented, moves all 4 extremities.       Assessment & Plan:

## 2013-04-30 ENCOUNTER — Encounter (INDEPENDENT_AMBULATORY_CARE_PROVIDER_SITE_OTHER): Payer: Medicare Other | Admitting: General Surgery

## 2013-05-01 ENCOUNTER — Encounter (HOSPITAL_BASED_OUTPATIENT_CLINIC_OR_DEPARTMENT_OTHER): Payer: Medicare Other | Attending: General Surgery

## 2013-05-01 DIAGNOSIS — I872 Venous insufficiency (chronic) (peripheral): Secondary | ICD-10-CM | POA: Insufficient documentation

## 2013-05-01 DIAGNOSIS — Y838 Other surgical procedures as the cause of abnormal reaction of the patient, or of later complication, without mention of misadventure at the time of the procedure: Secondary | ICD-10-CM | POA: Insufficient documentation

## 2013-05-01 DIAGNOSIS — L97209 Non-pressure chronic ulcer of unspecified calf with unspecified severity: Secondary | ICD-10-CM | POA: Insufficient documentation

## 2013-05-01 DIAGNOSIS — T8189XA Other complications of procedures, not elsewhere classified, initial encounter: Secondary | ICD-10-CM | POA: Insufficient documentation

## 2013-05-03 ENCOUNTER — Ambulatory Visit (INDEPENDENT_AMBULATORY_CARE_PROVIDER_SITE_OTHER): Payer: Medicare Other | Admitting: Cardiovascular Disease

## 2013-05-03 ENCOUNTER — Encounter: Payer: Self-pay | Admitting: Cardiovascular Disease

## 2013-05-03 VITALS — BP 124/60 | HR 95 | Ht 71.0 in | Wt 164.5 lb

## 2013-05-03 DIAGNOSIS — N183 Chronic kidney disease, stage 3 unspecified: Secondary | ICD-10-CM

## 2013-05-03 DIAGNOSIS — IMO0001 Reserved for inherently not codable concepts without codable children: Secondary | ICD-10-CM

## 2013-05-03 DIAGNOSIS — G4733 Obstructive sleep apnea (adult) (pediatric): Secondary | ICD-10-CM

## 2013-05-03 DIAGNOSIS — Z7901 Long term (current) use of anticoagulants: Secondary | ICD-10-CM

## 2013-05-03 DIAGNOSIS — W5501XA Bitten by cat, initial encounter: Secondary | ICD-10-CM

## 2013-05-03 DIAGNOSIS — J449 Chronic obstructive pulmonary disease, unspecified: Secondary | ICD-10-CM

## 2013-05-03 DIAGNOSIS — F17200 Nicotine dependence, unspecified, uncomplicated: Secondary | ICD-10-CM

## 2013-05-03 DIAGNOSIS — I4891 Unspecified atrial fibrillation: Secondary | ICD-10-CM

## 2013-05-03 DIAGNOSIS — T148XXA Other injury of unspecified body region, initial encounter: Secondary | ICD-10-CM

## 2013-05-03 DIAGNOSIS — E785 Hyperlipidemia, unspecified: Secondary | ICD-10-CM

## 2013-05-03 DIAGNOSIS — I10 Essential (primary) hypertension: Secondary | ICD-10-CM

## 2013-05-03 DIAGNOSIS — I2510 Atherosclerotic heart disease of native coronary artery without angina pectoris: Secondary | ICD-10-CM

## 2013-05-03 NOTE — Progress Notes (Signed)
Patient ID: [name], male   DOB: [birth year], 74 y.o.   MRN: [id number]       HPI: [name] is a 73 y.o. male who presents for followup evaluation of his recent atrial fibrillation with increased ventricular response for which he saw Kerin Ransom, [facility] one month ago.  [name] has established coronary artery disease and underwent stenting of his right coronary artery in 2003 by Dr. Glade Lloyd. I first saw him in 2011 when he had symptoms suggestive of CAD progression.  Repeat catheterization  showed total occlusion of the RCA within the previously stented segment.  I was able to successfully reopen this vessel and also performed PTCA distal to his previously placed stents. He did have concomitant CAD of the LAD and circumflex vessels. He has a documented infrarenal abdominal aortic aneurysm. He has ongoing tobacco use with documented COPD/emphysema, history of hypertension hyperlipidemia and obstructive sleep apnea. He was found to be in atrial fibrillation in March 2014 at that time was started on Eliquis anticoagulation and rate control medications. A cardioversion was performed on 06/07/2012 with restoration of sinus rhythm. Unfortunately, he did not hold and develop recurrent AF. Due to his COPD I was concerned about initiating amiodarone and  then started him on Multaq 400 mg twice a day and further titrated his Bystolic.  He was set up for a an attempt at another cardioversion in July but prior to this was hospitalized with a COPD exacerbation and his elective cardioversion was canceled. However, he then presented to the pulmonology office for followup evaluation and was noted to be in atrial fibrillation with rapid ventricular response which then led to her another [facility] hospitalization. At that time he was then started on IV Cardizem for rate control. While on Multaq, another attempt at cardioversion was done by Dr. Debara Pickett which was unsuccessful and the thought process was for him to stay in  permanent atrial fibrillation. When I last saw him in the office on 09/05/2012 we elected to keep him in atrial fibrillation. He did have leg edema I started him on HCTZ 12.5 mg. He is unaware of his ventricular rate. He denies chest pain. He states he has significantly reduced his tobacco use and also started using electronic cigarettes. He also has obstructive sleep apnea on CPAP.   In January 2015 he was bitten by his cat. This resulted in significant infection and development of necrotic tissue. His eliquis was held, he was treated with antibiotics and he underwent surgical debridement on 8 x 6 cm superficial necrotic wound in his left lower leg leg done by Dr. Serita Grammes. He is still undergoing drainage of this and continues to have wounds care center evaluations. He does note leg swelling. He is back on eliquis anticoagulation. He denies recent bleeding. He denies recent anginal symptoms. Unfortunately he is still smoking cigarettes.  He presented to the office one month ago and at that time his atrial fibrillation rate was rapid in the 120s. He saw Kerin Ransom. There was some confusion in the medications at the patient's were taking. He did have edema bilaterally. Creatinine was 1.75.  Recently, his rate has stabilized on diltiazem at 947 mg daily, Bystolic 20 mg twice a day. He denies any bleeding. He has been taking the Lasix now 40 mg twice a day with resolution of his prior edema particularly in the right leg. His left leg has remained bandaged and is still undergoing wound care treatments. He does wheeze mildly. He  states unfortunately he is not all that successful in smoking cessation. He uses his CPAP with 100% compliance. He presents for followup.   Past Medical History  Diagnosis Date  . COPD (chronic obstructive pulmonary disease)   . Active smoker   . Sleep apnea     managed by Dr. Shelia Media  . Obesity   . Coronary artery disease   . Benign neoplasm of colon   . Atrial  fibrillation   . Pyelonephritis, unspecified   . Other and unspecified hyperlipidemia   . Personal history of peptic ulcer disease   . Elevated prostate specific antigen (PSA)   . Diaphragmatic hernia without mention of obstruction or gangrene   . Esophageal reflux   . Tobacco use disorder   . Unspecified essential hypertension   . Peripheral vascular disease     Past Surgical History  Procedure Laterality Date  . Coronary artery stent  2003  . Cardioversion N/A 06/07/2012    Procedure: CARDIOVERSION;  Surgeon: Troy Sine, MD;  Location: [place];  Service: Cardiovascular;  Laterality: N/A;  . Cardioversion N/A 08/17/2012    Procedure: CARDIOVERSION;  Surgeon: Pixie Casino, MD;  Location: [facility] ENDOSCOPY;  Service: Cardiovascular;  Laterality: N/A;  . Coronary angioplasty    . Appendectomy    . I&d extremity Left 03/05/2013    Procedure: IRRIGATION AND DEBRIDEMENT EXTREMITY;  Surgeon: Rolm Bookbinder, MD;  Location: [place];  Service: General;  Laterality: Left;    Allergies  Allergen Reactions  . Ranitidine Hcl     REACTION: throat swelling, difficulty breathing  . Beta Adrenergic Blockers     REACTION: sexual side effects  . Ramipril     REACTION: throat swelling  . Sulfonamide Derivatives     REACTION: childhood reation of hematuria  . Ticlopidine Hcl     REACTION: swelling    Current Outpatient Prescriptions  Medication Sig Dispense Refill  . albuterol (PROVENTIL HFA;VENTOLIN HFA) 108 (90 BASE) MCG/ACT inhaler Inhale 1 puff into the lungs every 6 (six) hours as needed for wheezing or shortness of breath.      Marland Kitchen apixaban (ELIQUIS) 5 MG TABS tablet Take 5 mg by mouth 2 (two) times daily.      Marland Kitchen aspirin 81 MG tablet Take 81 mg by mouth daily.        Marland Kitchen atorvastatin (LIPITOR) 40 MG tablet 1 by mouth at bedtime      . budesonide-formoterol (SYMBICORT) 160-4.5 MCG/ACT inhaler Inhale 2 puffs into the lungs 2 (two) times daily.  1 Inhaler  12  . digoxin (LANOXIN) 0.125 MG  tablet Take 1 tablet (0.125 mg total) by mouth daily.  90 tablet  3  . diltiazem (TIAZAC) 360 MG 24 hr capsule Take 360 mg by mouth daily.      . furosemide (LASIX) 40 MG tablet Take 1 tablet (40 mg total) by mouth 2 (two) times daily.  60 tablet  5  . isosorbide mononitrate (IMDUR) 60 MG 24 hr tablet Take 30 mg by mouth daily.      . Multiple Vitamins-Minerals (MULTIVITAMIN WITH MINERALS) tablet Take 1 tablet by mouth daily.        . Nebivolol HCl 20 MG TABS Take 1 tablet (20 mg total) by mouth 2 (two) times daily.  60 tablet  11  . oxyCODONE-acetaminophen (PERCOCET/ROXICET) 5-325 MG per tablet       . pantoprazole (PROTONIX) 40 MG tablet Take 1 tablet by mouth daily.      Marland Kitchen tiotropium (SPIRIVA)  18 MCG inhalation capsule Place 18 mcg into inhaler and inhale daily.         No current facility-administered medications for this visit.    History   Social History  . Marital Status: Married    Spouse Name: N/A    Number of Children: 2  . Years of Education: N/A   Occupational History  . 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 Smoking status: Current Every Day Smoker -- 1.00 packs/day for 55 years    Types: Cigarettes  . Smokeless tobacco: Never Used     Comment: started at age 69.  prev was 2 ppd.   . Alcohol Use: 1.8 oz/week    3 Shots of liquor per week  . Drug Use: No  . Sexual Activity: Not on file   Other Topics Concern  . Not on file   Social History Narrative  . No narrative on file    ROS is negative for fevers, chills or night sweats. He denies visual symptoms. There are no hearing changes.  He denies tremors. He denies cough or increased sputum production. He denies any awareness of increased heart rate and is unaware that his heart rhythm is irregular. He does wheeze intermittently. He denies bleeding. He denies chest pressure. He still smokes cigarettes. He denies bleeding. His prior edema in the right lower extremity has resolved. There  is no claudication. There is no diabetes. He sleeps with CPAP. Other comprehensive 14 point system review is negative.  PE BP 124/60  Pulse 95  Ht 5\' 11"  (1.803 m)  Wt 164 lb 8 oz (74.617 kg)  BMI 22.95 kg/m2  General: Alert, oriented, no distress.  Skin: normal turgor, no rashes HEENT: Normocephalic, atraumatic. Pupils round and reactive; sclera anicteric;no lid lag. No central Nose without nasal septal hypertrophy Mouth/Parynx benign; Mallinpatti scale 3 Neck: No JVD, no carotid briuts Lungs: Diffusely decreased breath sounds due to long-standing tobacco use.; no wheezing or rales Heart: Irregularly irregular with a ventricular rate no longer tachycardic and now in the upper 80s to 90s beats per minute, s1 s2 normal 1/6 systolic murmur; no diastolic murmur. No S3 or S4 gallop. Abdomen: soft, nontender; no hepatosplenomehaly, BS+; abdominal aorta nontender and not dilated by palpation. Pulses 2+ Extremities: Resolution of prior right lower extremity edema. His left leg remains bandaged; no clubbing cyanosis, Homan's sign negative  Neurologic: grossly nonfocal Psychologic: Normal affect and mood   ECG (independently read by me): Atrial fibrillation with a controlled ventricular response in the 90s. Nonspecific ST changes.  LABS:  BMET    Component Value Date/Time   NA 139 03/06/2013 0218   K 4.2 03/06/2013 0218   CL 98 03/06/2013 0218   CO2 30 03/06/2013 0218   GLUCOSE 86 03/06/2013 0218   BUN 22 03/06/2013 0218   CREATININE 1.75* 03/06/2013 0218   CALCIUM 8.7 03/06/2013 0218   GFRNONAA 37* 03/06/2013 0218   GFRAA 43* 03/06/2013 0218     Hepatic Function Panel     Component Value Date/Time   PROT 7.2 03/04/2013 1943   ALBUMIN 3.5 03/04/2013 1943   AST 17 03/04/2013 1943   ALT 12 03/04/2013 1943   ALKPHOS 90 03/04/2013 1943   BILITOT 0.4 03/04/2013 1943     CBC    Component Value Date/Time   WBC 9.3 03/04/2013 1943   RBC 4.47 03/04/2013 1943   HGB 13.2 03/04/2013 1943   HCT 38.9* 03/04/2013 1943  PLT 176 03/04/2013 1943   MCV 87.0 03/04/2013 1943   MCH 29.5 03/04/2013 1943   MCHC 33.9 03/04/2013 1943   RDW 15.2 03/04/2013 1943   LYMPHSABS 1.7 03/04/2013 1943   MONOABS 1.1* 03/04/2013 1943   EOSABS 0.1 03/04/2013 1943   BASOSABS 0.0 03/04/2013 1943     BNP    Component Value Date/Time   PROBNP 5009.0* 03/05/2013 1005    Lipid Panel  No results found for this basename: chol,  trig,  hdl,  cholhdl,  vldl,  ldlcalc     RADIOLOGY: Dg Chest Port 1 View  08/16/2012   *RADIOLOGY REPORT*  Clinical Data: Short of breath  PORTABLE CHEST - 1 VIEW  Comparison: 08/03/2012  Findings: COPD with hyperinflation of the lungs.  Negative for pneumonia.  Negative for heart failure or effusion.  IMPRESSION: No acute abnormality.  COPD.   Original Report Authenticated By: Carl Best, M.D.      ASSESSMENT AND PLAN: Mr. Amedeo Plenty is a 73 year old gentleman with established coronary artery disease and now has permanent atrial fibrillation on eliquis anticoagulation therapy. He  developed a CAT bite leading to infection in development of necrotic tissue requiring recent debridement. He is now back on his anticoagulation therapy. His peripheral edema has improved with furosemide rather than hydrochlorothiazide and is now taking this at 40 mg twice a day. His weight is significantly improved with combination diltiazem at 892 mg and diastolic at 20 mg twice a day. He's not having significant wheezing today. Unfortunately he continues to smoke cigarettes. A long discussion concerning smoking cessation. He does have significant COPD/emphysema. He is using CPAP with 100% compliance. He still undergoing undergoing care treatments for his lower extremity cat bite and did require debridement procedures. He does have mild renal insufficiency with most recent creatinine at 1.75. Potassium was normal at 4.2. I recommended he continue his current medical regimen. With reference to his coronary artery disease, is not having any anginal  symptoms on his medical regimen which also consists of isosorbide mononitrate 60 mg. He does have GERD but this is controlled with Protonix. I will see him in 6 months for cardiology reevaluation or sooner if problems arise.   Troy Sine, MD, [facility]  05/03/2013 8:27 AM

## 2013-05-03 NOTE — Patient Instructions (Signed)
Continue current medication  Your physician wants you to follow-up in 6 month Dr Claiborne Billings.  You will receive a reminder letter in the mail two months in advance. If you don't receive a letter, please call our office to schedule the follow-up appointment.

## 2013-05-07 ENCOUNTER — Telehealth (INDEPENDENT_AMBULATORY_CARE_PROVIDER_SITE_OTHER): Payer: Self-pay

## 2013-05-07 NOTE — Telephone Encounter (Signed)
LMOM returning their call from yesterday. I am trying to f/u from a message that billing brought me questioning a misc code on [place] [employer].

## 2013-05-08 ENCOUNTER — Telehealth: Payer: Self-pay | Admitting: *Deleted

## 2013-05-08 NOTE — Telephone Encounter (Signed)
Returned signed V. Jenetta Downer. To [facility] for [facility] changes 03/13/13.

## 2013-05-14 ENCOUNTER — Encounter (INDEPENDENT_AMBULATORY_CARE_PROVIDER_SITE_OTHER): Payer: Medicare Other | Admitting: General Surgery

## 2013-05-29 ENCOUNTER — Other Ambulatory Visit: Payer: Self-pay | Admitting: Pharmacist Clinician (PhC)/ Clinical Pharmacy Specialist

## 2013-06-05 ENCOUNTER — Encounter (HOSPITAL_BASED_OUTPATIENT_CLINIC_OR_DEPARTMENT_OTHER): Payer: Medicare Other | Attending: General Surgery

## 2013-06-05 DIAGNOSIS — L97809 Non-pressure chronic ulcer of other part of unspecified lower leg with unspecified severity: Secondary | ICD-10-CM | POA: Insufficient documentation

## 2013-06-05 DIAGNOSIS — I872 Venous insufficiency (chronic) (peripheral): Secondary | ICD-10-CM | POA: Insufficient documentation

## 2013-06-18 ENCOUNTER — Ambulatory Visit: Payer: Medicare Other | Admitting: Cardiovascular Disease

## 2013-06-27 ENCOUNTER — Other Ambulatory Visit: Payer: Self-pay | Admitting: Urology

## 2013-06-27 DIAGNOSIS — N2889 Other specified disorders of kidney and ureter: Secondary | ICD-10-CM

## 2013-07-03 ENCOUNTER — Encounter (HOSPITAL_BASED_OUTPATIENT_CLINIC_OR_DEPARTMENT_OTHER): Payer: Medicare Other | Attending: General Surgery

## 2013-07-03 DIAGNOSIS — L97809 Non-pressure chronic ulcer of other part of unspecified lower leg with unspecified severity: Secondary | ICD-10-CM | POA: Insufficient documentation

## 2013-07-04 ENCOUNTER — Ambulatory Visit
Admission: RE | Admit: 2013-07-04 | Discharge: 2013-07-04 | Disposition: A | Discharge status: Home / Self Care | Payer: Medicare Other | Source: Ambulatory Visit | Attending: Urology | Admitting: Urology

## 2013-07-04 DIAGNOSIS — N2889 Other specified disorders of kidney and ureter: Secondary | ICD-10-CM

## 2013-07-05 ENCOUNTER — Telehealth: Payer: Self-pay | Admitting: Cardiovascular Disease

## 2013-07-05 ENCOUNTER — Telehealth: Payer: Self-pay | Admitting: Pulmonary Disease

## 2013-07-05 NOTE — Telephone Encounter (Signed)
Ok to hold

## 2013-07-05 NOTE — Telephone Encounter (Signed)
Dr.Yamagata needs this pt. To be off eliquis for 48 hrs before kidney surgery. Please advise

## 2013-07-05 NOTE — Telephone Encounter (Signed)
ATC and the office was closed  [facility] on Monday 07/08/13

## 2013-07-05 NOTE — Telephone Encounter (Signed)
Need cardiac clarence for kidney surgery and also can he stop his Eliquis 48 hours before surgery please.Please fax to [phone] Dr Kathlene Cote is the surgeon.

## 2013-07-05 NOTE — Telephone Encounter (Signed)
Pt. Needs to be off eliquis 48 hrs before kidney surgery

## 2013-07-08 ENCOUNTER — Telehealth: Payer: Self-pay | Admitting: Emergency Medicine

## 2013-07-08 NOTE — Telephone Encounter (Signed)
[email or web address].Jennifer Castillo, CMA  

## 2013-07-08 NOTE — Telephone Encounter (Signed)
LM W/ HOLLY TO CK WITH THE NURSE TO SEE IF APPT IS NEEDED FOR PT FOR PULMONARY CLEARANCE PRIOR TO RENAL CRYOABLATION.- WILL CK W/ DR AND CALL ME BACK.

## 2013-07-08 NOTE — Telephone Encounter (Signed)
LEFT MSG W/ [place]LVIA W/ DR KELLY'S OFFICE TO SEE IF  PT NEEDS CARDIAC CLEARANCE AND IF HE CAN HOLD HIS ELIQUIS FOR 41 HRS PRIOR TO RENAL CRYOABLATION.- WILL HAVE RN CALL ME BACK.

## 2013-07-09 NOTE — Telephone Encounter (Signed)
Pt. May be off eliquis for 48 hrs prior to kidney procedure per Dr. Shelva Majestic

## 2013-07-09 NOTE — Telephone Encounter (Signed)
Note sent via epic to Dr. Kathlene Cote, also form faxed to tammy at the office

## 2013-07-09 NOTE — Telephone Encounter (Signed)
Spoke with Tammy  She states that the pt is needing pulmonary clearance for left renal cryoablation under general anesthesia  Pt last seen here in March  Please advise if needs ov, or if you can go ahead and clear him  Surgery is scheduled for 08/09/13  Thanks

## 2013-07-09 NOTE — Telephone Encounter (Signed)
lmomtcb x1 for [name]

## 2013-07-09 NOTE — Telephone Encounter (Signed)
Pt has severe copd, and is at increased risk for pulmonary complications after the procedure. The pt can improve this by stopping smoking 100% at least 2 weeks prior to the procedure.

## 2013-07-09 NOTE — Telephone Encounter (Signed)
LMTCB for [name] 

## 2013-07-10 ENCOUNTER — Telehealth: Payer: Self-pay | Admitting: Cardiovascular Disease

## 2013-07-10 ENCOUNTER — Other Ambulatory Visit: Payer: Self-pay | Admitting: Interventional Radiology

## 2013-07-10 DIAGNOSIS — N2889 Other specified disorders of kidney and ureter: Secondary | ICD-10-CM

## 2013-07-10 NOTE — Telephone Encounter (Signed)
Spoke with Tammy and advised. She requests this be faxed to her at [phone]. Note faxed. [place] Bing, CMA

## 2013-07-10 NOTE — Telephone Encounter (Signed)
[email or web address]. For tammy.Price Bing, CMA

## 2013-07-10 NOTE — Telephone Encounter (Signed)
[place] faxed clearance 6/9 and re-faxed 6/10  Patient is low risk Left MV for Tammy letting her know it was faxed x2

## 2013-07-10 NOTE — Telephone Encounter (Signed)
Received his okay to stop his medicine,but still a note for clarence for his procedure. Please [phone] Att: Tammy

## 2013-07-15 ENCOUNTER — Telehealth: Payer: Self-pay | Admitting: Emergency Medicine

## 2013-07-15 NOTE — Telephone Encounter (Signed)
lmoam at home # to have pt call back about his clearances.  Need to give Eliquis instructions and to tell pt to stop smoking 2 weeks prior to procedure.   07-18-13- @ 1620PM/ LMOVM FOR PT ON HOME # WITH SPECIFIC INSTRUCTIONS ABOUT HIS APPT FOR 08-09-13. LEFT WORD THAT PT NEEDS TO STOP SMOKING ON 07-26-13 AND NOT START BACK UNTIL AFTER THE PROCEDURE HAS BEEN COMPLETED.   ALSO, HE NEEDS TO STOP HIS ELIQUIS ON EITHER TUES OR WED, PRIOR TO THE PROCEDURE DEPENDING IF HE TAKES IN THE AM OR PM.  ASKED PT TO CALL ME BACK TO GO OVER THESE INSTRUCTIONS.

## 2013-07-18 ENCOUNTER — Ambulatory Visit (HOSPITAL_COMMUNITY)
Admission: RE | Admit: 2013-07-18 | Discharge: 2013-07-18 | Disposition: A | Discharge status: Home / Self Care | Payer: Medicare Other | Source: Ambulatory Visit | Attending: Ophthalmology | Admitting: Ophthalmology

## 2013-07-18 ENCOUNTER — Encounter (HOSPITAL_COMMUNITY): Payer: Medicare Other | Admitting: Anesthesiology

## 2013-07-18 ENCOUNTER — Emergency Department (HOSPITAL_COMMUNITY)
Admission: EM | Admit: 2013-07-18 | Discharge: 2013-07-18 | Discharge status: Against Medical Advice | Payer: Medicare Other

## 2013-07-18 ENCOUNTER — Encounter (HOSPITAL_COMMUNITY): Payer: Self-pay | Admitting: *Deleted

## 2013-07-18 ENCOUNTER — Other Ambulatory Visit: Payer: Self-pay | Admitting: Ophthalmology

## 2013-07-18 ENCOUNTER — Ambulatory Visit (HOSPITAL_COMMUNITY): Payer: Medicare Other | Admitting: Anesthesiology

## 2013-07-18 ENCOUNTER — Encounter (HOSPITAL_COMMUNITY)
Admission: RE | Disposition: A | Discharge status: Home / Self Care | Payer: Self-pay | Source: Ambulatory Visit | Attending: Ophthalmology

## 2013-07-18 DIAGNOSIS — E669 Obesity, unspecified: Secondary | ICD-10-CM | POA: Diagnosis not present

## 2013-07-18 DIAGNOSIS — H16009 Unspecified corneal ulcer, unspecified eye: Secondary | ICD-10-CM | POA: Insufficient documentation

## 2013-07-18 DIAGNOSIS — H44001 Unspecified purulent endophthalmitis, right eye: Secondary | ICD-10-CM

## 2013-07-18 DIAGNOSIS — E785 Hyperlipidemia, unspecified: Secondary | ICD-10-CM | POA: Insufficient documentation

## 2013-07-18 DIAGNOSIS — K219 Gastro-esophageal reflux disease without esophagitis: Secondary | ICD-10-CM | POA: Diagnosis not present

## 2013-07-18 DIAGNOSIS — IMO0002 Reserved for concepts with insufficient information to code with codable children: Secondary | ICD-10-CM | POA: Diagnosis not present

## 2013-07-18 DIAGNOSIS — I2510 Atherosclerotic heart disease of native coronary artery without angina pectoris: Secondary | ICD-10-CM | POA: Diagnosis not present

## 2013-07-18 DIAGNOSIS — I739 Peripheral vascular disease, unspecified: Secondary | ICD-10-CM | POA: Diagnosis not present

## 2013-07-18 DIAGNOSIS — G4730 Sleep apnea, unspecified: Secondary | ICD-10-CM | POA: Diagnosis not present

## 2013-07-18 DIAGNOSIS — H20059 Hypopyon, unspecified eye: Secondary | ICD-10-CM | POA: Insufficient documentation

## 2013-07-18 DIAGNOSIS — Z7982 Long term (current) use of aspirin: Secondary | ICD-10-CM | POA: Diagnosis not present

## 2013-07-18 DIAGNOSIS — Z7901 Long term (current) use of anticoagulants: Secondary | ICD-10-CM | POA: Insufficient documentation

## 2013-07-18 DIAGNOSIS — J449 Chronic obstructive pulmonary disease, unspecified: Secondary | ICD-10-CM | POA: Insufficient documentation

## 2013-07-18 DIAGNOSIS — Z961 Presence of intraocular lens: Secondary | ICD-10-CM | POA: Diagnosis not present

## 2013-07-18 DIAGNOSIS — I4891 Unspecified atrial fibrillation: Secondary | ICD-10-CM | POA: Diagnosis not present

## 2013-07-18 DIAGNOSIS — F17200 Nicotine dependence, unspecified, uncomplicated: Secondary | ICD-10-CM | POA: Diagnosis not present

## 2013-07-18 DIAGNOSIS — Z9849 Cataract extraction status, unspecified eye: Secondary | ICD-10-CM | POA: Insufficient documentation

## 2013-07-18 DIAGNOSIS — J4489 Other specified chronic obstructive pulmonary disease: Secondary | ICD-10-CM | POA: Insufficient documentation

## 2013-07-18 DIAGNOSIS — I10 Essential (primary) hypertension: Secondary | ICD-10-CM | POA: Diagnosis not present

## 2013-07-18 DIAGNOSIS — H44009 Unspecified purulent endophthalmitis, unspecified eye: Secondary | ICD-10-CM | POA: Diagnosis present

## 2013-07-18 DIAGNOSIS — K449 Diaphragmatic hernia without obstruction or gangrene: Secondary | ICD-10-CM | POA: Diagnosis not present

## 2013-07-18 HISTORY — PX: PARS PLANA VITRECTOMY: SHX2166

## 2013-07-18 LAB — CBC
HCT: 39.9 % (ref 39.0–52.0)
Hemoglobin: 12.9 g/dL — ABNORMAL LOW (ref 13.0–17.0)
MCH: 29.3 pg (ref 26.0–34.0)
MCHC: 32.3 g/dL (ref 30.0–36.0)
MCV: 90.7 fL (ref 78.0–100.0)
PLATELETS: 127 10*3/uL — AB (ref 150–400)
RBC: 4.4 MIL/uL (ref 4.22–5.81)
RDW: 15.7 % — AB (ref 11.5–15.5)
WBC: 9.4 10*3/uL (ref 4.0–10.5)

## 2013-07-18 LAB — BASIC METABOLIC PANEL
BUN: 29 mg/dL — ABNORMAL HIGH (ref 6–23)
CALCIUM: 9.4 mg/dL (ref 8.4–10.5)
CO2: 22 meq/L (ref 19–32)
Chloride: 104 mEq/L (ref 96–112)
Creatinine, Ser: 1.62 mg/dL — ABNORMAL HIGH (ref 0.50–1.35)
GFR calc Af Amer: 47 mL/min — ABNORMAL LOW (ref >90)
GFR, EST NON AFRICAN AMERICAN: 41 mL/min — AB (ref >90)
GLUCOSE: 104 mg/dL — AB (ref 70–99)
Potassium: 4.6 mEq/L (ref 3.7–5.3)
Sodium: 141 mEq/L (ref 137–147)

## 2013-07-18 LAB — PROTIME-INR
INR: 0.94 (ref 0.00–1.49)
Prothrombin Time: 12.4 seconds (ref 11.6–15.2)

## 2013-07-18 LAB — APTT: aPTT: 27 seconds (ref 24–37)

## 2013-07-18 SURGERY — PARS PLANA VITRECTOMY 25 GAUGE FOR ENDOPHTHALMITIS
Anesthesia: General | Site: Eye | Laterality: Right

## 2013-07-18 MED ORDER — FENTANYL CITRATE 0.05 MG/ML IJ SOLN: 25.0000 ug | INTRAMUSCULAR | Status: DC | PRN

## 2013-07-18 MED ORDER — SODIUM HYALURONATE 10 MG/ML IO SOLN
INTRAOCULAR | Status: AC
  Filled 2013-07-18: qty 0.85

## 2013-07-18 MED ORDER — GATIFLOXACIN 0.5 % OP SOLN
1.0000 [drp] | OPHTHALMIC | Status: AC | PRN
  Administered 2013-07-18 (×3): 1 [drp] via OPHTHALMIC
  Filled 2013-07-18: qty 2.5

## 2013-07-18 MED ORDER — LIDOCAINE HCL 2 % IJ SOLN
INTRAMUSCULAR | Status: AC
  Filled 2013-07-18: qty 20

## 2013-07-18 MED ORDER — EPINEPHRINE HCL 1 MG/ML IJ SOLN
INTRAMUSCULAR | Status: DC | PRN
  Administered 2013-07-18: .3 mL

## 2013-07-18 MED ORDER — OXYCODONE HCL 5 MG/5ML PO SOLN: 5.0000 mg | Freq: Once | ORAL | Status: DC | PRN

## 2013-07-18 MED ORDER — PHENYLEPHRINE HCL 2.5 % OP SOLN
1.0000 [drp] | OPHTHALMIC | Status: AC | PRN
  Administered 2013-07-18 (×3): 1 [drp] via OPHTHALMIC
  Filled 2013-07-18: qty 2

## 2013-07-18 MED ORDER — SODIUM CHLORIDE 0.9 % IJ SOLN
INTRAMUSCULAR | Status: AC
  Filled 2013-07-18: qty 10

## 2013-07-18 MED ORDER — NEBIVOLOL HCL 10 MG PO TABS
20.0000 mg | ORAL_TABLET | Freq: Once | ORAL | Status: AC
  Administered 2013-07-18: 20 mg via ORAL
  Filled 2013-07-18: qty 2

## 2013-07-18 MED ORDER — FENTANYL CITRATE 0.05 MG/ML IJ SOLN
INTRAMUSCULAR | Status: AC
  Filled 2013-07-18: qty 5

## 2013-07-18 MED ORDER — LIDOCAINE HCL 2 % IJ SOLN
INTRAMUSCULAR | Status: DC | PRN
  Administered 2013-07-18: 10 mL

## 2013-07-18 MED ORDER — EPINEPHRINE HCL 1 MG/ML IJ SOLN
INTRAMUSCULAR | Status: AC
  Filled 2013-07-18: qty 1

## 2013-07-18 MED ORDER — TETRACAINE HCL 0.5 % OP SOLN
OPHTHALMIC | Status: AC
  Filled 2013-07-18: qty 2

## 2013-07-18 MED ORDER — HYPROMELLOSE (GONIOSCOPIC) 2.5 % OP SOLN
OPHTHALMIC | Status: DC | PRN
  Administered 2013-07-18: 1 [drp] via OPHTHALMIC

## 2013-07-18 MED ORDER — HYPROMELLOSE (GONIOSCOPIC) 2.5 % OP SOLN
OPHTHALMIC | Status: AC
  Filled 2013-07-18: qty 15

## 2013-07-18 MED ORDER — PROPOFOL 10 MG/ML IV BOLUS
INTRAVENOUS | Status: DC | PRN
  Administered 2013-07-18: 20 mg via INTRAVENOUS

## 2013-07-18 MED ORDER — DEXAMETHASONE SODIUM PHOSPHATE 10 MG/ML IJ SOLN
INTRAMUSCULAR | Status: AC
  Filled 2013-07-18: qty 1

## 2013-07-18 MED ORDER — SODIUM CHLORIDE 0.9 % IV SOLN
INTRAVENOUS | Status: DC
  Administered 2013-07-18: 14:00:00 via INTRAVENOUS

## 2013-07-18 MED ORDER — NA CHONDROIT SULF-NA HYALURON 40-30 MG/ML IO SOLN
INTRAOCULAR | Status: AC
  Filled 2013-07-18: qty 0.5

## 2013-07-18 MED ORDER — CEFTAZIDIME INTRAVITREAL INJECTION 2.25 MG/0.1 ML
2.2500 mg | INTRAVITREAL | Status: DC
  Filled 2013-07-18: qty 0.1

## 2013-07-18 MED ORDER — MIDAZOLAM HCL 2 MG/2ML IJ SOLN
INTRAMUSCULAR | Status: AC
  Filled 2013-07-18: qty 2

## 2013-07-18 MED ORDER — BSS PLUS IO SOLN
INTRAOCULAR | Status: DC | PRN
  Administered 2013-07-18: 1 via INTRAOCULAR

## 2013-07-18 MED ORDER — VANCOMYCIN INTRAVITREAL INJECTION 1 MG/0.1 ML
1.0000 mg | INTRAOCULAR | Status: DC
  Filled 2013-07-18: qty 0.1

## 2013-07-18 MED ORDER — BSS PLUS IO SOLN
INTRAOCULAR | Status: AC
  Filled 2013-07-18: qty 500

## 2013-07-18 MED ORDER — POLYMYXIN B SULFATE 500000 UNITS IJ SOLR
INTRAMUSCULAR | Status: AC
  Filled 2013-07-18: qty 1

## 2013-07-18 MED ORDER — OXYCODONE HCL 5 MG PO TABS: 5.0000 mg | ORAL_TABLET | Freq: Once | ORAL | Status: DC | PRN

## 2013-07-18 MED ORDER — ONDANSETRON HCL 4 MG/2ML IJ SOLN: 4.0000 mg | Freq: Once | INTRAMUSCULAR | Status: DC | PRN

## 2013-07-18 MED ORDER — CYCLOPENTOLATE HCL 1 % OP SOLN
1.0000 [drp] | OPHTHALMIC | Status: AC | PRN
  Administered 2013-07-18 (×3): 1 [drp] via OPHTHALMIC
  Filled 2013-07-18: qty 2

## 2013-07-18 MED ORDER — FENTANYL CITRATE 0.05 MG/ML IJ SOLN
INTRAMUSCULAR | Status: DC | PRN
  Administered 2013-07-18: 50 ug via INTRAVENOUS

## 2013-07-18 MED ORDER — MIDAZOLAM HCL 5 MG/5ML IJ SOLN
INTRAMUSCULAR | Status: DC | PRN
  Administered 2013-07-18: 1 mg via INTRAVENOUS

## 2013-07-18 MED ORDER — GENTAMICIN SULFATE 40 MG/ML IJ SOLN
INTRAMUSCULAR | Status: AC
  Filled 2013-07-18: qty 2

## 2013-07-18 MED ORDER — KETOROLAC TROMETHAMINE 30 MG/ML IJ SOLN: 15.0000 mg | Freq: Once | INTRAMUSCULAR | Status: DC | PRN

## 2013-07-18 SURGICAL SUPPLY — 45 items
APL SRG 3 HI ABS STRL LF PLS (MISCELLANEOUS)
APPLICATOR COTTON TIP 6IN STRL (MISCELLANEOUS) ×3 IMPLANT
APPLICATOR DR MATTHEWS STRL (MISCELLANEOUS) IMPLANT
BLADE 10 SAFETY STRL DISP (BLADE) ×1 IMPLANT
CANNULA ANT CHAM MAIN (OPHTHALMIC RELATED) IMPLANT
CANNULA FLEX TIP 25G (CANNULA) IMPLANT
CANNULA VLV SOFT TIP 25G (OPHTHALMIC) ×1 IMPLANT
CANNULA VLV SOFT TIP 25GA (OPHTHALMIC) ×3 IMPLANT
CORDS BIPOLAR (ELECTRODE) IMPLANT
COVER MAYO STAND STRL (DRAPES) ×3 IMPLANT
DRAPE OPHTHALMIC 77X100 STRL (CUSTOM PROCEDURE TRAY) ×3 IMPLANT
FILTER BLUE MILLIPORE (MISCELLANEOUS) IMPLANT
FILTER STRAW FLUID ASPIR (MISCELLANEOUS) IMPLANT
FORCEPS ECKARDT ILM 25G SERR (OPHTHALMIC RELATED) ×3 IMPLANT
GLOVE SS BIOGEL STRL SZ 8.5 (GLOVE) ×1 IMPLANT
GLOVE SUPERSENSE BIOGEL SZ 8.5 (GLOVE) ×2
GOWN STRL REUS W/ TWL LRG LVL3 (GOWN DISPOSABLE) ×1 IMPLANT
GOWN STRL REUS W/TWL LRG LVL3 (GOWN DISPOSABLE) ×3
KIT BASIN OR (CUSTOM PROCEDURE TRAY) ×3 IMPLANT
KIT ROOM TURNOVER OR (KITS) ×3 IMPLANT
LENS BIOM SUPER VIEW SET DISP (OPHTHALMIC RELATED) ×3 IMPLANT
MASK EYE SHIELD (GAUZE/BANDAGES/DRESSINGS) ×2 IMPLANT
NDL 18GX1X1/2 (RX/OR ONLY) (NEEDLE) ×1 IMPLANT
NDL 25GX 5/8IN NON SAFETY (NEEDLE) ×1 IMPLANT
NDL HYPO 30X.5 LL (NEEDLE) ×2 IMPLANT
NEEDLE 18GX1X1/2 (RX/OR ONLY) (NEEDLE) ×6 IMPLANT
NEEDLE 25GX 5/8IN NON SAFETY (NEEDLE) ×3 IMPLANT
NEEDLE HYPO 30X.5 LL (NEEDLE) ×9 IMPLANT
PACK VITRECTOMY CUSTOM (CUSTOM PROCEDURE TRAY) ×3 IMPLANT
PAD ARMBOARD 7.5X6 YLW CONV (MISCELLANEOUS) ×6 IMPLANT
PAK PIK VITRECTOMY CVS 25GA (OPHTHALMIC) ×3 IMPLANT
PENCIL BIPOLAR 25GA STR DISP (OPHTHALMIC RELATED) IMPLANT
PROBE LASER ILLUM FLEX CVD 25G (OPHTHALMIC) IMPLANT
RESERVOIR BACK FLUSH (MISCELLANEOUS) IMPLANT
ROLLS DENTAL (MISCELLANEOUS) IMPLANT
STOCKINETTE IMPERVIOUS 9X36 MD (GAUZE/BANDAGES/DRESSINGS) ×6 IMPLANT
STOPCOCK 4 WAY LG BORE MALE ST (IV SETS) IMPLANT
SUT MERSILENE 5 0 RD 1 DA (SUTURE) IMPLANT
SUT VICRYL 7 0 TG140 8 (SUTURE) IMPLANT
SWAB COLLECTION DEVICE MRSA (MISCELLANEOUS) ×3 IMPLANT
SYR TB 1ML LUER SLIP (SYRINGE) ×6 IMPLANT
TAPE SURG TRANSPORE 1 IN (GAUZE/BANDAGES/DRESSINGS) IMPLANT
TAPE SURGICAL TRANSPORE 1 IN (GAUZE/BANDAGES/DRESSINGS) ×2
TOWEL OR 17X24 6PK STRL BLUE (TOWEL DISPOSABLE) ×6 IMPLANT
TUBE ANAEROBIC SPECIMEN COL (MISCELLANEOUS) ×3 IMPLANT

## 2013-07-18 NOTE — H&P (Signed)
[name] is an 73 y.o. male.   Chief Complaint: Vision loss right eye of 2 day onset. HPI: Patient is 3 days status post cataract extraction with intraocular lens place uncomplicated fashion with an outside physician patient's family in the office to have corneal ulcer at the site of corneal incision followed by 1 mm hypopyon layered inferiorly and suspected endophthalmitis of the right eye.  Past Medical History  Diagnosis Date  . COPD (chronic obstructive pulmonary disease)   . Active smoker   . Obesity   . Coronary artery disease   . Benign neoplasm of colon   . Atrial fibrillation   . Pyelonephritis, unspecified   . Other and unspecified hyperlipidemia   . Personal history of peptic ulcer disease   . Elevated prostate specific antigen (PSA)   . Diaphragmatic hernia without mention of obstruction or gangrene   . Esophageal reflux   . Tobacco use disorder   . Unspecified essential hypertension   . Peripheral vascular disease   . Sleep apnea     managed by Dr. Corky Downs    Past Surgical History  Procedure Laterality Date  . Coronary artery stent  2003  . Cardioversion N/A 06/07/2012    Procedure: CARDIOVERSION;  Surgeon: Troy Sine, MD;  Location: [place];  Service: Cardiovascular;  Laterality: N/A;  . Cardioversion N/A 08/17/2012    Procedure: CARDIOVERSION;  Surgeon: Pixie Casino, MD;  Location: [facility] ENDOSCOPY;  Service: Cardiovascular;  Laterality: N/A;  . Coronary angioplasty    . Appendectomy    . I&d extremity Left 03/05/2013    Procedure: IRRIGATION AND DEBRIDEMENT EXTREMITY; left leg  Surgeon: Rolm Bookbinder, MD;  Location: [facility] OR;  Service: General;  Laterality: Left;    Family History  Problem Relation Age of Onset  . Heart disease Mother   . Coronary artery disease Father   . Emphysema Father    Social History:  reports that he has been smoking Cigarettes.  He has a 55 pack-year smoking history. He has never used smokeless tobacco. He reports  that he drinks about 1.8 ounces of alcohol per week. He reports that he does not use illicit drugs.  Allergies:  Allergies  Allergen Reactions  . Ranitidine Hcl     REACTION: throat swelling, difficulty breathing  . Beta Adrenergic Blockers     REACTION: sexual side effects  . Ramipril     REACTION: throat swelling  . Sulfonamide Derivatives     REACTION: childhood reation of hematuria  . Ticlopidine Hcl     REACTION: swelling    Medications Prior to Admission  Medication Sig Dispense Refill  . albuterol (PROVENTIL HFA;VENTOLIN HFA) 108 (90 BASE) MCG/ACT inhaler Inhale 1 puff into the lungs every 6 (six) hours as needed for wheezing or shortness of breath.      Marland Kitchen apixaban (ELIQUIS) 5 MG TABS tablet Take 5 mg by mouth 2 (two) times daily.      Marland Kitchen aspirin 81 MG tablet Take 81 mg by mouth daily.        Marland Kitchen atorvastatin (LIPITOR) 40 MG tablet Take 40 mg by mouth at bedtime.       . budesonide-formoterol (SYMBICORT) 160-4.5 MCG/ACT inhaler Inhale 2 puffs into the lungs 2 (two) times daily.  1 Inhaler  12  . digoxin (LANOXIN) 0.125 MG tablet Take 1 tablet (0.125 mg total) by mouth daily.  90 tablet  3  . diltiazem (TIAZAC) 360 MG 24 hr capsule Take 360 mg by mouth daily.      Marland Kitchen  furosemide (LASIX) 40 MG tablet Take 40 mg by mouth daily.      . hydrochlorothiazide (MICROZIDE) 12.5 MG capsule Take 12.5 mg by mouth daily.      . isosorbide mononitrate (IMDUR) 30 MG 24 hr tablet Take 15 mg by mouth daily.      Marland Kitchen ketorolac (ACULAR) 0.4 % SOLN Place 1 drop into the right eye 4 (four) times daily.       . Multiple Vitamins-Minerals (MULTIVITAMIN WITH MINERALS) tablet Take 1 tablet by mouth daily.        . nebivolol (BYSTOLIC) 10 MG tablet Take 10-20 mg by mouth 2 (two) times daily. Take 72m in the morning and 17min the evening.      . Marland Kitchenfloxacin (OCUFLOX) 0.3 % ophthalmic solution Place 1 drop into the right eye 4 (four) times daily.       . pantoprazole (PROTONIX) 40 MG tablet Take 40 mg by mouth  daily.       . prednisoLONE acetate (PRED FORTE) 1 % ophthalmic suspension Place 1 drop into the right eye 4 (four) times daily.       . Marland Kitcheniotropium (SPIRIVA) 18 MCG inhalation capsule Place 18 mcg into inhaler and inhale daily.          Results for orders placed during the hospital encounter of 07/18/13 (from the past 48 hour(s))  BASIC METABOLIC PANEL     Status: Abnormal   Collection Time    07/18/13 12:50 PM      Result Value Ref Range   Sodium 141  137 - 147 mEq/L   Potassium 4.6  3.7 - 5.3 mEq/L   Chloride 104  96 - 112 mEq/L   CO2 22  19 - 32 mEq/L   Glucose, Bld 104 (*) 70 - 99 mg/dL   BUN 29 (*) 6 - 23 mg/dL   Creatinine, Ser 1.62 (*) 0.50 - 1.35 mg/dL   Calcium 9.4  8.4 - 10.5 mg/dL   GFR calc non Af Amer 41 (*) >90 mL/min   GFR calc Af Amer 47 (*) >90 mL/min   Comment: (NOTE)     The eGFR has been calculated using the CKD EPI equation.     This calculation has not been validated in all clinical situations.     eGFR's persistently <90 mL/min signify possible Chronic Kidney     Disease.  CBC     Status: Abnormal   Collection Time    07/18/13 12:50 PM      Result Value Ref Range   WBC 9.4  4.0 - 10.5 K/uL   RBC 4.40  4.22 - 5.81 MIL/uL   Hemoglobin 12.9 (*) 13.0 - 17.0 g/dL   HCT 39.9  39.0 - 52.0 %   MCV 90.7  78.0 - 100.0 fL   MCH 29.3  26.0 - 34.0 pg   MCHC 32.3  30.0 - 36.0 g/dL   RDW 15.7 (*) 11.5 - 15.5 %   Platelets 127 (*) 150 - 400 K/uL  PROTIME-INR     Status: None   Collection Time    07/18/13 12:50 PM      Result Value Ref Range   Prothrombin Time 12.4  11.6 - 15.2 seconds   INR 0.94  0.00 - 1.49  APTT     Status: None   Collection Time    07/18/13 12:50 PM      Result Value Ref Range   aPTT 27  24 - 37 seconds   No results  found.  Review of Systems  Constitutional: Negative.   HENT: Negative.   Eyes: Positive for blurred vision.  Respiratory: Negative.   Cardiovascular: Negative.   Gastrointestinal: Negative.   Musculoskeletal: Negative.    Skin: Negative.   Neurological: Negative.   Psychiatric/Behavioral: Negative.     Blood pressure 188/103, pulse 117, temperature 97.8 F (36.6 C), temperature source Oral, resp. rate 22, height _0  (1.803 m), weight 79.379 kg (175 lb), SpO2 100.00%. Physical Exam  Nursing note and vitals reviewed. Constitutional: He is oriented to person, place, and time. Vital signs are normal. He appears well-developed and well-nourished.  HENT:  Head: Normocephalic and atraumatic.  Eyes: EOM are normal. Pupils are equal, round, and reactive to light. Right eye exhibits chemosis. Right conjunctiva is injected.  Slit lamp exam:      The right eye shows corneal ulcer and hypopyon.    VISION 20/80   OD,,, IN OFFICE MICROCYSTIC EPITHERLIAL EDEMA OF CORNEA, WITH 1 MM hypopyon, with 1+ cell and flare in the anterior chamber in a thin thread of fibrin. Anterior vitreous appears clear. No funduscopic details are visible red reflex is noted.  Corneal hasn't infiltrated 10:30 position near the site of the clear corneal cataract incision. This is infiltrated with  white cells  Neck: Trachea normal and normal range of motion. Neck supple.  Cardiovascular: Normal rate.   Respiratory: Effort normal and breath sounds normal.  GI: Soft. Normal appearance and bowel sounds are normal.  Musculoskeletal: Normal range of motion.  Neurological: He is alert and oriented to person, place, and time. He has normal reflexes.  Skin: Skin is warm and dry.  Psychiatric: He has a normal mood and affect. His speech is normal and behavior is normal. Judgment and thought content normal. Cognition and memory are normal.     Assessment/Plan #1. Endophthalmitis acute, right eye #2 corneal ulcer right eye. #3 hypopyon right eye #4 suspected endophthalmitis with pain  Plan  #1 PARS plana vitrectomy  right eye., limited with diagnostic vitreous tap and anterior chamber 10 corneal scrapings also be done. #2 injection of  intravitreal antibiotics #3 performance of these procedures of the right eye will be undertaken under local anesthesia with monitored anesthesia control.  RANKIN,GARY A 07/18/2013, 2:25 PM

## 2013-07-18 NOTE — Anesthesia Postprocedure Evaluation (Signed)
  Anesthesia Post-op Note  Patient: [name]  Procedure(s) Performed: Procedure(s): PARS PLANA VITRECTOMY 25 GAUGE FOR ENDOPHTHALMITIS WITH INTRAVITREAL ANTIBIOTICS (Right)  Patient Location: PACU  Anesthesia Type:MAC  Level of Consciousness: awake, alert  and oriented  Airway and Oxygen Therapy: Patient Spontanous Breathing  Post-op Pain: mild  Post-op Assessment: Post-op Vital signs reviewed, Patient's Cardiovascular Status Stable, Respiratory Function Stable, Patent Airway and Pain level controlled  Post-op Vital Signs: stable  Last Vitals:  Filed Vitals:   07/18/13 1608  BP: 161/92  Pulse: 101  Temp:   Resp:     Complications: No apparent anesthesia complications

## 2013-07-18 NOTE — Anesthesia Preprocedure Evaluation (Signed)
Anesthesia Evaluation  Patient identified by MRN, date of birth, ID band Patient awake    Reviewed: Allergy & Precautions, H&P , NPO status , Patient's Chart, lab work & pertinent test results  Airway Mallampati: II TM Distance: >3 FB Neck ROM: Full    Dental  (+) Teeth Intact, Dental Advisory Given   Pulmonary Current Smoker,  breath sounds clear to auscultation        Cardiovascular hypertension, Rhythm:Regular Rate:Normal     Neuro/Psych    GI/Hepatic   Endo/Other    Renal/GU      Musculoskeletal   Abdominal   Peds  Hematology   Anesthesia Other Findings   Reproductive/Obstetrics                           Anesthesia Physical Anesthesia Plan  ASA: III  Anesthesia Plan: General   Post-op Pain Management:    Induction: Intravenous  Airway Management Planned: Oral ETT  Additional Equipment:   Intra-op Plan:   Post-operative Plan: Extubation in OR  Informed Consent: I have reviewed the patients History and Physical, chart, labs and discussed the procedure including the risks, benefits and alternatives for the proposed anesthesia with the patient or authorized representative who has indicated his/her understanding and acceptance.   Dental advisory given  Plan Discussed with: CRNA and Anesthesiologist  Anesthesia Plan Comments: (S/P cataract surgery R. Eye  6/15  With endopthalmitis Chronic Afib on eliquis Smoker/COPD Hypertension Sleep apnea on CPAP  Plan GA  With oral ETT  Roberts Gaudy, MD )        Anesthesia Quick Evaluation

## 2013-07-18 NOTE — Anesthesia Procedure Notes (Signed)
Procedure Name: MAC Date/Time: 07/18/2013 3:10 PM Performed by: Julian Reil Pre-anesthesia Checklist: Patient identified, Emergency Drugs available, Suction available and Patient being monitored Patient Re-evaluated:Patient Re-evaluated prior to inductionOxygen Delivery Method: Nasal cannula Intubation Type: IV induction Placement Confirmation: positive ETCO2 and breath sounds checked- equal and bilateral

## 2013-07-18 NOTE — Transfer of Care (Signed)
Immediate Anesthesia Transfer of Care Note  Patient: [name]  Procedure(s) Performed: Procedure(s): PARS PLANA VITRECTOMY 25 GAUGE FOR ENDOPHTHALMITIS (N/A)  Patient Location: PACU  Anesthesia Type:MAC  Level of Consciousness: awake, alert , oriented and patient cooperative  Airway & Oxygen Therapy: Patient Spontanous Breathing and Patient connected to nasal cannula oxygen  Post-op Assessment: Report given to PACU RN, Post -op Vital signs reviewed and stable and Patient moving all extremities  Post vital signs: Reviewed and stable  Complications: No apparent anesthesia complications

## 2013-07-18 NOTE — Progress Notes (Signed)
Patient arrived to the recovery room with an eye shield and gauze dressing applied to his right eye. Dressing clean dry and intact. Patient complains of no pain.

## 2013-07-18 NOTE — Brief Op Note (Signed)
07/18/2013  3:57 PM  PATIENT:  [name]  73 y.o. male  PRE-OPERATIVE DIAGNOSIS:  endopthalmitis, acute right eye  POST-OPERATIVE DIAGNOSIS:  Same  PROCEDURE:  Procedure(s): PARS PLANA VITRECTOMY 25 GAUGE FOR ENDOPHTHALMITIS (N/A),, injection of intravitreal antibiotics right eye-that has been 2.25 mg/0.1 cc, vancomycin 1.0 mg/0.1 cc diagnostic vitreous With cultures of vitreous washings and vitreous tap In a. Corneal scraping the corneal cultures of a corneal ulcer  SURGEON:  Surgeon(s) and Role:    * Hurman Horn, MD - Primary  PHYSICIAN ASSISTANT:   ASSISTANTS: none   ANESTHESIA:   IV sedation, local retrobulbar R.  EBL:  Total I/O In: 300 [I.V.:300] Out: 5 [Blood:5]  BLOOD ADMINISTERED:none  DRAINS: none   LOCAL MEDICATIONS USED:  XYLOCAINE   SPECIMEN:  Source of Specimen:  Vitreous washings. Anterior chamber  DISPOSITION OF SPECIMEN:  Microbiology microbiology  COUNTS:  YES  TOURNIQUET:  * No tourniquets in log *  DICTATION: .Other Dictation: Dictation Number [id number]  PLAN OF CARE: Discharge to home after PACU  PATIENT DISPOSITION:  PACU - hemodynamically stable.   Delay start of Pharmacological VTE agent (>24hrs) due to surgical blood loss or risk of bleeding: not applicable

## 2013-07-19 ENCOUNTER — Other Ambulatory Visit: Payer: Self-pay | Admitting: Radiology

## 2013-07-19 NOTE — Op Note (Deleted)
[name], [name]                ACCOUNT NO.:  [id number]  MEDICAL RECORD NO.:  [id number]  LOCATION:                               FACILITY:  [facility]  PHYSICIAN:  Dominica Severin A. Rankin, M.D.   DATE OF BIRTH:  02-02-1940  DATE OF PROCEDURE:  07/18/2013 DATE OF DISCHARGE:  07/18/2013                              OPERATIVE REPORT   PREOPERATIVE DIAGNOSES: 1. Acute endophthalmitis, right eye. 2. Status post recent cataract traction intra-ocular lens placement,     right eye.  POSTOPERATIVE DIAGNOSES: 1. Acute endophthalmitis, right eye. 2. Status post recent cataract traction intra-ocular lens placement,     right eye.  PROCEDURE: 1. Posterior vitrectomy with diagnostic vitreous tap. 2. Intravitreal injection of antibiotics -- ceftazidime 2.25 mg/0.1 mL     and vancomycin 1.0 mg/0.1 mL.  SURGEON:  Clent Demark. Rankin, M.D.  ANESTHESIA:  Local retrobulbar monitored control.  PROCEDURE INDICATION:  The patient is a 73 year old man who has acute vision loss on the basis of suspected endophthalmitis.  He does have __________ corneal ulcer at the area corneal incision site and has diplopia.  For this reason, cannot exclude that he has endophthalmitis. For this reason, the decision was made to perform diagnostic vitreous and therapeutic tap as well as corneal cultures and vitreous culture. The patient understands the risks of anesthesia, occurrence of death, loss of the eye from the underlying condition and the surgical intervention including, but not limited to, hemorrhage, infection, scarring, need for another surgery, change in vision, loss of vision, progressive disease despite intervention.  Appropriate signed consent was obtained.  DESCRIPTION OF PROCEDURE:  The patient was taken to the operating room. In the operating room, appropriate monitoring  __________ appropriate site __________ confirmed as the right eye with the operating staff and the surgeon.  Thereafter prior to sterilely  prepping, 5% Xylocaine injected retrobulbar with additional 5 mL __________.  Prior to sterile prepping, bacterial transfer media was used with sterile cotton swabs to swab and aggressively swab the corneal ulcer site and this was placed for transfer media for culture __________ cornea.  Thereafter, anterior chamber __________ was then carried out, and this was directly __________ as well as to the anaerobic medium.  Thereafter, the 25-gauge trocar was placed in the inferotemporal quadrant, vitreous and secured and confirmed to be in the posterior chamber.  Infusion turned on. Superior trocars applied __________ began in limited fashion.  This verified that the vitreous was moderately opaque, but not aggressively inflamed.  Core vitrectomy was completed for vitreous washings and bacteriology.  The __________ was removed from the eye.  The retina appeared normal.  There appeared to be no retinal vasculitis.  At this time, the instruments were removed from the eye.  The __________ was then removed from the eye, and intravitreal injections of 0.1 mL of Fortaz followed by 0.1 mL of vancomycin and the aforementioned __________ were injected via the pars plana into the vitreous cavity. Remnants of these injections were placed in the subconjunctival space.  Particularly, vancomycin was applied temporally near the side of the corneal ulcer.  No complications occurred.  Sterile patch and Fox shield were applied.  The patient tolerated  the procedure well with no complications, taken to PACU in good stable condition, and will be discharged home as an outpatient.     Clent Demark Rankin, M.D.     GAR/MEDQ  D:  07/18/2013  T:  07/18/2013  Job:  [id number]  cc:   Herbie Baltimore L. Katy Fitch, M.D.

## 2013-07-19 NOTE — Op Note (Signed)
[name], [name]                ACCOUNT NO.:  [id number]  MEDICAL RECORD NO.:  [id number]  LOCATION:                               FACILITY:  [facility]  PHYSICIAN:  Dominica Severin A. Rankin, M.D.   DATE OF BIRTH:  1940-08-04  DATE OF PROCEDURE:  07/18/2013 DATE OF DISCHARGE:  07/18/2013                              OPERATIVE REPORT   PREOPERATIVE DIAGNOSES: 1. Acute endophthalmitis, right eye. 2. Status post recent cataract traction intra-ocular lens placement,     right eye.  POSTOPERATIVE DIAGNOSES: 1. Acute endophthalmitis, right eye. 2. Status post recent cataract traction intra-ocular lens placement,     right eye.  PROCEDURE: 1. Posterior vitrectomy with diagnostic vitreous tap. 2. Intravitreal injection of antibiotics -- ceftazidime 2.25 mg/0.1 mL     and vancomycin 1.0 mg/0.1 mL.  SURGEON:  Clent Demark. Rankin, M.D.  ANESTHESIA:  Local retrobulbar monitored control.  PROCEDURE INDICATION:  The patient is a 73 year old man who has acute vision loss on the basis of suspected endophthalmitis.  He does have __________ corneal ulcer at the area corneal incision site and has diplopia.  For this reason, cannot exclude that he has endophthalmitis. For this reason, the decision was made to perform diagnostic vitreous and therapeutic tap as well as corneal cultures and vitreous culture. The patient understands the risks of anesthesia, occurrence of death, loss of the eye from the underlying condition and the surgical intervention including, but not limited to, hemorrhage, infection, scarring, need for another surgery, change in vision, loss of vision, progressive disease despite intervention.  Appropriate signed consent was obtained.  DESCRIPTION OF PROCEDURE:  The patient was taken to the operating room. In the operating room, appropriate monitoring  __________ appropriate site __________ confirmed as the right eye with the operating staff and the surgeon.  Thereafter prior to sterilely  prepping, 5% Xylocaine injected retrobulbar with additional 5 mL __________.  Prior to sterile prepping, bacterial transfer media was used with sterile cotton swabs to swab and aggressively swab the corneal ulcer site and this was placed for transfer media for culture __________ cornea.  Thereafter, anterior chamber __________ was then carried out, and this was directly __________ as well as to the anaerobic medium.  Thereafter, the 25-gauge trocar was placed in the inferotemporal quadrant, vitreous and secured and confirmed to be in the posterior chamber.  Infusion turned on. Superior trocars applied __________ began in limited fashion.  This verified that the vitreous was moderately opaque, but not aggressively inflamed.  Core vitrectomy was completed for vitreous washings and bacteriology.  The __________ was removed from the eye.  The retina appeared normal.  There appeared to be no retinal vasculitis.  At this time, the instruments were removed from the eye.  The __________ was then removed from the eye, and intravitreal injections of 0.1 mL of Fortaz followed by 0.1 mL of vancomycin and the aforementioned __________ were injected via the pars plana into the vitreous cavity. Remnants of these injections were placed in the subconjunctival space.  Particularly, vancomycin was applied temporally near the side of the corneal ulcer.  No complications occurred.  Sterile patch and Fox shield were applied.  The patient tolerated  the procedure well with no complications, taken to PACU in good stable condition, and will be discharged home as an outpatient.     Clent Demark Rankin, M.D.     GAR/MEDQ  D:  07/18/2013  T:  07/18/2013  Job:  [id number]  cc:   Herbie Baltimore L. Katy Fitch, M.D.

## 2013-07-21 ENCOUNTER — Encounter (HOSPITAL_COMMUNITY): Payer: Self-pay | Admitting: Ophthalmology

## 2013-07-21 LAB — EYE CULTURE

## 2013-07-23 LAB — ANAEROBIC CULTURE: Gram Stain: NONE SEEN

## 2013-07-25 ENCOUNTER — Telehealth: Payer: Self-pay | Admitting: Emergency Medicine

## 2013-07-25 NOTE — Telephone Encounter (Signed)
Patient (pt states to cancel, had eye surg and having issues and wants to wait on this appt, pt states he will call dr office )  PT CALLED ME CANCELLED ABLATION ON 08-09-13 AND WILL R/S LATER AFTER HIS EYE INFECTION HAS BEEN CLEARED UP. WILL NOTIFY DR GY.

## 2013-07-26 ENCOUNTER — Encounter (HOSPITAL_COMMUNITY): Payer: Self-pay | Admitting: Pharmacy Technician

## 2013-07-26 LAB — EYE CULTURE
Culture: NO GROWTH
Culture: NO GROWTH

## 2013-08-07 ENCOUNTER — Encounter (HOSPITAL_BASED_OUTPATIENT_CLINIC_OR_DEPARTMENT_OTHER): Payer: Medicare Other | Attending: General Surgery

## 2013-08-07 DIAGNOSIS — J4489 Other specified chronic obstructive pulmonary disease: Secondary | ICD-10-CM | POA: Insufficient documentation

## 2013-08-07 DIAGNOSIS — S91009A Unspecified open wound, unspecified ankle, initial encounter: Secondary | ICD-10-CM | POA: Diagnosis not present

## 2013-08-07 DIAGNOSIS — S81009A Unspecified open wound, unspecified knee, initial encounter: Secondary | ICD-10-CM | POA: Insufficient documentation

## 2013-08-07 DIAGNOSIS — W5581XA Bitten by other mammals, initial encounter: Secondary | ICD-10-CM

## 2013-08-07 DIAGNOSIS — S81809A Unspecified open wound, unspecified lower leg, initial encounter: Secondary | ICD-10-CM | POA: Diagnosis present

## 2013-08-07 DIAGNOSIS — J449 Chronic obstructive pulmonary disease, unspecified: Secondary | ICD-10-CM | POA: Diagnosis not present

## 2013-08-07 DIAGNOSIS — T148 Other injury of unspecified body region: Secondary | ICD-10-CM | POA: Insufficient documentation

## 2013-08-09 ENCOUNTER — Other Ambulatory Visit (HOSPITAL_COMMUNITY): Payer: Medicare Other

## 2013-08-09 ENCOUNTER — Ambulatory Visit: Admit: 2013-08-09 | Payer: Self-pay | Admitting: Interventional Radiology

## 2013-08-09 SURGERY — RADIO FREQUENCY ABLATION
Anesthesia: General | Laterality: Left

## 2013-08-14 ENCOUNTER — Ambulatory Visit (INDEPENDENT_AMBULATORY_CARE_PROVIDER_SITE_OTHER): Payer: Medicare Other | Admitting: Pulmonary Disease

## 2013-08-14 ENCOUNTER — Encounter: Payer: Self-pay | Admitting: Pulmonary Disease

## 2013-08-14 ENCOUNTER — Other Ambulatory Visit: Payer: Self-pay | Admitting: Pulmonary Disease

## 2013-08-14 VITALS — BP 140/80 | HR 99 | Temp 98.2°F | Ht 71.0 in | Wt 183.8 lb

## 2013-08-14 DIAGNOSIS — S81009A Unspecified open wound, unspecified knee, initial encounter: Secondary | ICD-10-CM | POA: Diagnosis not present

## 2013-08-14 DIAGNOSIS — J439 Emphysema, unspecified: Secondary | ICD-10-CM

## 2013-08-14 DIAGNOSIS — J441 Chronic obstructive pulmonary disease with (acute) exacerbation: Secondary | ICD-10-CM

## 2013-08-14 DIAGNOSIS — J438 Other emphysema: Secondary | ICD-10-CM

## 2013-08-14 DIAGNOSIS — J449 Chronic obstructive pulmonary disease, unspecified: Secondary | ICD-10-CM | POA: Diagnosis not present

## 2013-08-14 MED ORDER — PREDNISONE 10 MG PO TABS: ORAL_TABLET | ORAL | Status: DC

## 2013-08-14 MED ORDER — CEFDINIR 300 MG PO CAPS: 600.0000 mg | ORAL_CAPSULE | Freq: Every day | ORAL | Status: DC

## 2013-08-14 NOTE — Assessment & Plan Note (Signed)
The patient is clearly having an acute COPD exacerbation, and he will need to be treated with a course of prednisone and antibiotics in order to improve. I have stressed to him again the importance of total smoking cessation, and I've also encouraged him to wear his oxygen with exertion on a more consistent basis.

## 2013-08-14 NOTE — Patient Instructions (Signed)
Will treat with an 8 day course of prednisone Start on omnicef 300mg , and take 2 each day for 5 days You need to wear your portable oxygen whenever you leave the house, and do not take off until you get back home or go somewhere to sit down. You must stop smoking!  This is killing you right now. followup wit me again in 27mos.

## 2013-08-14 NOTE — Progress Notes (Signed)
   Subjective:    Patient ID: [name], male    DOB: 12-06-1940, 73 y.o.   MRN: [id number]  HPI The patient comes in today for an acute sick visit. He has known COPD, and unfortunately continues to smoke. He is supposed to be wearing oxygen with exertion, but does not do so. He has had increasing chest congestion, cough with increasingly purulent mucus, and also worsening shortness of breath since the last visit. His wife is very concerned about his worsening breathing. He is staying on his usual bronchodilator regimen.   Review of Systems  Constitutional: Negative for fever and unexpected weight change.  HENT: Positive for rhinorrhea. Negative for congestion, dental problem, ear pain, nosebleeds, postnasal drip, sinus pressure, sneezing, sore throat and trouble swallowing.   Eyes: Negative for redness and itching.  Respiratory: Positive for cough, chest tightness, shortness of breath and wheezing.   Cardiovascular: Negative for palpitations and leg swelling.  Gastrointestinal: Negative for nausea and vomiting.  Genitourinary: Negative for dysuria.  Musculoskeletal: Negative for joint swelling.  Skin: Negative for rash.  Neurological: Negative for headaches.  Hematological: Does not bruise/bleed easily.  Psychiatric/Behavioral: Negative for dysphoric mood. The patient is not nervous/anxious.        Objective:   Physical Exam Thin male in no acute distress Nose without purulence or discharge noted Neck without lymphadenopathy or thyromegaly Chest with very diminished breath sounds throughout, no wheezing Cardiac exam with regular rate and rhythm Lower extremities with mild edema left greater than right, no cyanosis Alert and oriented, moves all 4 extremities.       Assessment & Plan:

## 2013-08-21 DIAGNOSIS — J449 Chronic obstructive pulmonary disease, unspecified: Secondary | ICD-10-CM | POA: Diagnosis not present

## 2013-08-21 DIAGNOSIS — S81009A Unspecified open wound, unspecified knee, initial encounter: Secondary | ICD-10-CM | POA: Diagnosis not present

## 2013-08-28 DIAGNOSIS — J449 Chronic obstructive pulmonary disease, unspecified: Secondary | ICD-10-CM | POA: Diagnosis not present

## 2013-08-28 DIAGNOSIS — S91009A Unspecified open wound, unspecified ankle, initial encounter: Secondary | ICD-10-CM | POA: Diagnosis not present

## 2013-08-28 DIAGNOSIS — S81009A Unspecified open wound, unspecified knee, initial encounter: Secondary | ICD-10-CM | POA: Diagnosis not present

## 2013-08-29 ENCOUNTER — Telehealth: Payer: Self-pay | Admitting: Radiology

## 2013-08-29 NOTE — Telephone Encounter (Signed)
Left message requesting patient call for status update.  Also, to inquire if patient is ready to schedule Left renal cryoablation.    Mikylah Ackroyd [place], RN 08/29/2013 1:40 PM

## 2013-09-04 ENCOUNTER — Encounter (HOSPITAL_BASED_OUTPATIENT_CLINIC_OR_DEPARTMENT_OTHER): Payer: Medicare Other | Attending: General Surgery

## 2013-09-04 DIAGNOSIS — I509 Heart failure, unspecified: Secondary | ICD-10-CM | POA: Diagnosis not present

## 2013-09-04 DIAGNOSIS — L97809 Non-pressure chronic ulcer of other part of unspecified lower leg with unspecified severity: Secondary | ICD-10-CM | POA: Insufficient documentation

## 2013-09-09 ENCOUNTER — Telehealth: Payer: Self-pay | Admitting: Radiology

## 2013-09-09 NOTE — Telephone Encounter (Signed)
Left message requesting patient return our call re:    status update and to determine if he is ready to schedule Cryoablation.    Sulma Ruffino Riki Rusk, RN 09/09/2013 4:34 PM

## 2013-09-11 ENCOUNTER — Ambulatory Visit (INDEPENDENT_AMBULATORY_CARE_PROVIDER_SITE_OTHER): Payer: Medicare Other | Admitting: Pulmonary Disease

## 2013-09-11 ENCOUNTER — Encounter: Payer: Self-pay | Admitting: Pulmonary Disease

## 2013-09-11 VITALS — BP 120/80 | HR 75 | Temp 98.0°F | Ht 71.0 in | Wt 188.0 lb

## 2013-09-11 DIAGNOSIS — I509 Heart failure, unspecified: Secondary | ICD-10-CM | POA: Diagnosis not present

## 2013-09-11 DIAGNOSIS — J441 Chronic obstructive pulmonary disease with (acute) exacerbation: Secondary | ICD-10-CM

## 2013-09-11 DIAGNOSIS — J438 Other emphysema: Secondary | ICD-10-CM

## 2013-09-11 DIAGNOSIS — J439 Emphysema, unspecified: Secondary | ICD-10-CM

## 2013-09-11 DIAGNOSIS — L97809 Non-pressure chronic ulcer of other part of unspecified lower leg with unspecified severity: Secondary | ICD-10-CM | POA: Diagnosis not present

## 2013-09-11 MED ORDER — PREDNISONE 10 MG PO TABS: ORAL_TABLET | ORAL | Status: DC

## 2013-09-11 NOTE — Progress Notes (Signed)
   Subjective:    Patient ID: [name], male    DOB: 09/15/1940, 73 y.o.   MRN: [id number]  HPI The patient comes in today for an acute sick visit. He has noticed increasing shortness of breath since his last visit a month ago, but unfortunately has continued to smoke and is not wearing his oxygen compliantly. He is not having increased congestion or purulent mucus at this time.   Review of Systems  Constitutional: Negative for fever and unexpected weight change.  HENT: Negative for congestion, dental problem, ear pain, nosebleeds, postnasal drip, rhinorrhea, sinus pressure, sneezing, sore throat and trouble swallowing.   Eyes: Negative for redness and itching.  Respiratory: Positive for cough and shortness of breath. Negative for chest tightness and wheezing.   Cardiovascular: Negative for palpitations and leg swelling.  Gastrointestinal: Negative for nausea and vomiting.  Genitourinary: Negative for dysuria.  Musculoskeletal: Negative for joint swelling.  Skin: Negative for rash.  Neurological: Negative for headaches.  Hematological: Does not bruise/bleed easily.  Psychiatric/Behavioral: Negative for dysphoric mood. The patient is not nervous/anxious.        Objective:   Physical Exam Well-developed male in no acute distress Nose without purulence or discharge noted Neck without lymphadenopathy or thyromegaly Chest with decreased breath sounds throughout, no definite wheezing Cardiac exam with regular rate and rhythm Lower extremities with 1+ edema on the left with a wrap in place, 2+ edema on the right Alert and oriented, moves all 4 extremities.       Assessment & Plan:

## 2013-09-11 NOTE — Assessment & Plan Note (Signed)
The patient has increasing shortness of breath that is most consistent with a developing COPD exacerbation. Unfortunately, he is still smoking, and is not wearing his oxygen compliantly. I have had another long discussion with him about this, and explained that we cannot keep him well if he does not stay more compliant with his treatment. I also explained that only one thing has ever been shown to lengthen life in COPD, and that is oxygen therapy. We'll treat the patient with a course of prednisone to get him through this, but he understands this will be a recurring theme if he is not more compliant with his overall treatment regimen.

## 2013-09-11 NOTE — Patient Instructions (Signed)
Will treat with an 8 day course of prednisone to get you thru this episode You need to wear your oxygen anytime you leave your house, and keep wearing until you get back.  Of course, also need to wear oxygen while sleeping. No change in breathing medications.  You are on maximal treatment for your copd. You have to stop smoking!! followup with me again in 61mos.  Make sure no extra apptms upcoming.  Please call if you are not improving.

## 2013-09-25 DIAGNOSIS — I509 Heart failure, unspecified: Secondary | ICD-10-CM | POA: Diagnosis not present

## 2013-09-25 DIAGNOSIS — L97809 Non-pressure chronic ulcer of other part of unspecified lower leg with unspecified severity: Secondary | ICD-10-CM | POA: Diagnosis not present

## 2013-09-27 ENCOUNTER — Encounter (HOSPITAL_COMMUNITY): Payer: Self-pay | Admitting: Student

## 2013-09-27 ENCOUNTER — Inpatient Hospital Stay (HOSPITAL_COMMUNITY)
Admission: AD | Admit: 2013-09-27 | Discharge: 2013-10-05 | DRG: 292 | Disposition: A | Discharge status: Home Health | Payer: Medicare Other | Source: Ambulatory Visit | Attending: Cardiovascular Disease | Admitting: Cardiovascular Disease

## 2013-09-27 ENCOUNTER — Encounter: Payer: Self-pay | Admitting: Physician Assistant

## 2013-09-27 ENCOUNTER — Ambulatory Visit (INDEPENDENT_AMBULATORY_CARE_PROVIDER_SITE_OTHER): Payer: Medicare Other | Admitting: Physician Assistant

## 2013-09-27 VITALS — BP 151/107 | HR 163 | Ht 71.0 in | Wt 201.7 lb

## 2013-09-27 DIAGNOSIS — IMO0002 Reserved for concepts with insufficient information to code with codable children: Secondary | ICD-10-CM

## 2013-09-27 DIAGNOSIS — T148XXA Other injury of unspecified body region, initial encounter: Secondary | ICD-10-CM

## 2013-09-27 DIAGNOSIS — I5043 Acute on chronic combined systolic (congestive) and diastolic (congestive) heart failure: Principal | ICD-10-CM

## 2013-09-27 DIAGNOSIS — N184 Chronic kidney disease, stage 4 (severe): Secondary | ICD-10-CM | POA: Diagnosis present

## 2013-09-27 DIAGNOSIS — R600 Localized edema: Secondary | ICD-10-CM | POA: Diagnosis present

## 2013-09-27 DIAGNOSIS — R5381 Other malaise: Secondary | ICD-10-CM | POA: Diagnosis present

## 2013-09-27 DIAGNOSIS — I472 Ventricular tachycardia, unspecified: Secondary | ICD-10-CM | POA: Diagnosis present

## 2013-09-27 DIAGNOSIS — I428 Other cardiomyopathies: Secondary | ICD-10-CM | POA: Diagnosis present

## 2013-09-27 DIAGNOSIS — K219 Gastro-esophageal reflux disease without esophagitis: Secondary | ICD-10-CM | POA: Diagnosis present

## 2013-09-27 DIAGNOSIS — J438 Other emphysema: Secondary | ICD-10-CM | POA: Diagnosis present

## 2013-09-27 DIAGNOSIS — N2889 Other specified disorders of kidney and ureter: Secondary | ICD-10-CM | POA: Diagnosis present

## 2013-09-27 DIAGNOSIS — E875 Hyperkalemia: Secondary | ICD-10-CM | POA: Diagnosis present

## 2013-09-27 DIAGNOSIS — I482 Chronic atrial fibrillation, unspecified: Secondary | ICD-10-CM | POA: Diagnosis present

## 2013-09-27 DIAGNOSIS — G4733 Obstructive sleep apnea (adult) (pediatric): Secondary | ICD-10-CM | POA: Diagnosis present

## 2013-09-27 DIAGNOSIS — F17200 Nicotine dependence, unspecified, uncomplicated: Secondary | ICD-10-CM | POA: Diagnosis present

## 2013-09-27 DIAGNOSIS — J449 Chronic obstructive pulmonary disease, unspecified: Secondary | ICD-10-CM | POA: Diagnosis present

## 2013-09-27 DIAGNOSIS — Z7982 Long term (current) use of aspirin: Secondary | ICD-10-CM | POA: Diagnosis not present

## 2013-09-27 DIAGNOSIS — J9610 Chronic respiratory failure, unspecified whether with hypoxia or hypercapnia: Secondary | ICD-10-CM | POA: Diagnosis present

## 2013-09-27 DIAGNOSIS — I2510 Atherosclerotic heart disease of native coronary artery without angina pectoris: Secondary | ICD-10-CM | POA: Diagnosis present

## 2013-09-27 DIAGNOSIS — I2789 Other specified pulmonary heart diseases: Secondary | ICD-10-CM | POA: Diagnosis present

## 2013-09-27 DIAGNOSIS — I129 Hypertensive chronic kidney disease with stage 1 through stage 4 chronic kidney disease, or unspecified chronic kidney disease: Secondary | ICD-10-CM | POA: Diagnosis present

## 2013-09-27 DIAGNOSIS — Z9981 Dependence on supplemental oxygen: Secondary | ICD-10-CM

## 2013-09-27 DIAGNOSIS — E785 Hyperlipidemia, unspecified: Secondary | ICD-10-CM | POA: Diagnosis present

## 2013-09-27 DIAGNOSIS — I509 Heart failure, unspecified: Secondary | ICD-10-CM | POA: Diagnosis present

## 2013-09-27 DIAGNOSIS — I4891 Unspecified atrial fibrillation: Secondary | ICD-10-CM | POA: Diagnosis present

## 2013-09-27 DIAGNOSIS — Z9861 Coronary angioplasty status: Secondary | ICD-10-CM | POA: Diagnosis not present

## 2013-09-27 DIAGNOSIS — Z79899 Other long term (current) drug therapy: Secondary | ICD-10-CM

## 2013-09-27 DIAGNOSIS — I5031 Acute diastolic (congestive) heart failure: Secondary | ICD-10-CM

## 2013-09-27 DIAGNOSIS — I739 Peripheral vascular disease, unspecified: Secondary | ICD-10-CM | POA: Diagnosis present

## 2013-09-27 DIAGNOSIS — Z8249 Family history of ischemic heart disease and other diseases of the circulatory system: Secondary | ICD-10-CM | POA: Diagnosis not present

## 2013-09-27 DIAGNOSIS — Z7901 Long term (current) use of anticoagulants: Secondary | ICD-10-CM

## 2013-09-27 DIAGNOSIS — I4729 Other ventricular tachycardia: Secondary | ICD-10-CM | POA: Diagnosis present

## 2013-09-27 DIAGNOSIS — R609 Edema, unspecified: Secondary | ICD-10-CM | POA: Diagnosis present

## 2013-09-27 DIAGNOSIS — W5501XA Bitten by cat, initial encounter: Secondary | ICD-10-CM

## 2013-09-27 DIAGNOSIS — I10 Essential (primary) hypertension: Secondary | ICD-10-CM | POA: Diagnosis present

## 2013-09-27 DIAGNOSIS — N183 Chronic kidney disease, stage 3 unspecified: Secondary | ICD-10-CM

## 2013-09-27 DIAGNOSIS — IMO0001 Reserved for inherently not codable concepts without codable children: Secondary | ICD-10-CM | POA: Diagnosis present

## 2013-09-27 HISTORY — DX: Chronic systolic (congestive) heart failure: I50.22

## 2013-09-27 HISTORY — DX: Shortness of breath: R06.02

## 2013-09-27 HISTORY — DX: Chronic respiratory failure, unspecified whether with hypoxia or hypercapnia: J96.10

## 2013-09-27 HISTORY — DX: Cardiac arrhythmia, unspecified: I49.9

## 2013-09-27 LAB — CBC WITH DIFFERENTIAL/PLATELET
BASOS ABS: 0 10*3/uL (ref 0.0–0.1)
Basophils Relative: 0 % (ref 0–1)
EOS ABS: 0.1 10*3/uL (ref 0.0–0.7)
Eosinophils Relative: 1 % (ref 0–5)
HCT: 39.5 % (ref 39.0–52.0)
Hemoglobin: 12.6 g/dL — ABNORMAL LOW (ref 13.0–17.0)
LYMPHS PCT: 15 % (ref 12–46)
Lymphs Abs: 1 10*3/uL (ref 0.7–4.0)
MCH: 28.8 pg (ref 26.0–34.0)
MCHC: 31.9 g/dL (ref 30.0–36.0)
MCV: 90.4 fL (ref 78.0–100.0)
Monocytes Absolute: 0.7 10*3/uL (ref 0.1–1.0)
Monocytes Relative: 10 % (ref 3–12)
Neutro Abs: 4.9 10*3/uL (ref 1.7–7.7)
Neutrophils Relative %: 74 % (ref 43–77)
PLATELETS: 125 10*3/uL — AB (ref 150–400)
RBC: 4.37 MIL/uL (ref 4.22–5.81)
RDW: 15.2 % (ref 11.5–15.5)
WBC: 6.6 10*3/uL (ref 4.0–10.5)

## 2013-09-27 LAB — BASIC METABOLIC PANEL
ANION GAP: 13 (ref 5–15)
BUN: 39 mg/dL — ABNORMAL HIGH (ref 6–23)
CALCIUM: 8.8 mg/dL (ref 8.4–10.5)
CO2: 24 meq/L (ref 19–32)
CREATININE: 2.12 mg/dL — AB (ref 0.50–1.35)
Chloride: 105 mEq/L (ref 96–112)
GFR, EST AFRICAN AMERICAN: 34 mL/min — AB (ref >90)
GFR, EST NON AFRICAN AMERICAN: 29 mL/min — AB (ref >90)
Glucose, Bld: 183 mg/dL — ABNORMAL HIGH (ref 70–99)
Potassium: 4.8 mEq/L (ref 3.7–5.3)
SODIUM: 142 meq/L (ref 137–147)

## 2013-09-27 LAB — MAGNESIUM: MAGNESIUM: 1.7 mg/dL (ref 1.5–2.5)

## 2013-09-27 LAB — TSH: TSH: 1 u[IU]/mL (ref 0.350–4.500)

## 2013-09-27 LAB — DIGOXIN LEVEL: Digoxin Level: <0.3 ng/mL — ABNORMAL LOW (ref 0.8–2.0)

## 2013-09-27 MED ORDER — DIGOXIN 125 MCG PO TABS
0.1250 mg | ORAL_TABLET | Freq: Every day | ORAL | Status: DC
  Administered 2013-09-27: 0.125 mg via ORAL
  Filled 2013-09-27 (×2): qty 1

## 2013-09-27 MED ORDER — PREDNISOLONE ACETATE 1 % OP SUSP: 1.0000 [drp] | Freq: Four times a day (QID) | OPHTHALMIC | Status: DC

## 2013-09-27 MED ORDER — NEBIVOLOL HCL 10 MG PO TABS
20.0000 mg | ORAL_TABLET | Freq: Every day | ORAL | Status: DC
  Administered 2013-09-28 – 2013-10-05 (×8): 20 mg via ORAL
  Filled 2013-09-27 (×8): qty 2

## 2013-09-27 MED ORDER — NEBIVOLOL HCL 10 MG PO TABS
10.0000 mg | ORAL_TABLET | Freq: Every day | ORAL | Status: DC
  Administered 2013-09-27 – 2013-10-04 (×8): 10 mg via ORAL
  Filled 2013-09-27 (×11): qty 1

## 2013-09-27 MED ORDER — FUROSEMIDE 10 MG/ML IJ SOLN
40.0000 mg | Freq: Two times a day (BID) | INTRAMUSCULAR | Status: DC
  Administered 2013-09-27 – 2013-10-01 (×8): 40 mg via INTRAVENOUS
  Filled 2013-09-27 (×11): qty 4

## 2013-09-27 MED ORDER — POTASSIUM CHLORIDE CRYS ER 20 MEQ PO TBCR
20.0000 meq | EXTENDED_RELEASE_TABLET | Freq: Two times a day (BID) | ORAL | Status: DC
  Administered 2013-09-27 – 2013-09-28 (×3): 20 meq via ORAL
  Filled 2013-09-27 (×6): qty 1

## 2013-09-27 MED ORDER — ONDANSETRON HCL 4 MG/2ML IJ SOLN: 4.0000 mg | Freq: Four times a day (QID) | INTRAMUSCULAR | Status: DC | PRN

## 2013-09-27 MED ORDER — SODIUM CHLORIDE 0.9 % IJ SOLN: 3.0000 mL | INTRAMUSCULAR | Status: DC | PRN

## 2013-09-27 MED ORDER — TIOTROPIUM BROMIDE MONOHYDRATE 18 MCG IN CAPS
18.0000 ug | ORAL_CAPSULE | Freq: Every day | RESPIRATORY_TRACT | Status: DC
  Administered 2013-09-28 – 2013-10-05 (×7): 18 ug via RESPIRATORY_TRACT
  Filled 2013-09-27 (×2): qty 5

## 2013-09-27 MED ORDER — APIXABAN 5 MG PO TABS
5.0000 mg | ORAL_TABLET | Freq: Two times a day (BID) | ORAL | Status: DC
  Administered 2013-09-27 – 2013-10-05 (×16): 5 mg via ORAL
  Filled 2013-09-27 (×18): qty 1

## 2013-09-27 MED ORDER — NEBIVOLOL HCL 10 MG PO TABS: 10.0000 mg | ORAL_TABLET | Freq: Two times a day (BID) | ORAL | Status: DC

## 2013-09-27 MED ORDER — ASPIRIN EC 81 MG PO TBEC
81.0000 mg | DELAYED_RELEASE_TABLET | Freq: Every day | ORAL | Status: DC
  Administered 2013-09-28 – 2013-10-05 (×8): 81 mg via ORAL
  Filled 2013-09-27 (×8): qty 1

## 2013-09-27 MED ORDER — ATORVASTATIN CALCIUM 40 MG PO TABS
40.0000 mg | ORAL_TABLET | Freq: Every day | ORAL | Status: DC
  Administered 2013-09-27 – 2013-10-04 (×8): 40 mg via ORAL
  Filled 2013-09-27 (×9): qty 1

## 2013-09-27 MED ORDER — DILTIAZEM HCL 100 MG IV SOLR
10.0000 mg/h | INTRAVENOUS | Status: DC
  Administered 2013-09-27 (×2): 10 mg/h via INTRAVENOUS
  Filled 2013-09-27 (×2): qty 100

## 2013-09-27 MED ORDER — ACETAMINOPHEN 325 MG PO TABS: 650.0000 mg | ORAL_TABLET | ORAL | Status: DC | PRN

## 2013-09-27 MED ORDER — PANTOPRAZOLE SODIUM 40 MG PO TBEC
40.0000 mg | DELAYED_RELEASE_TABLET | Freq: Every day | ORAL | Status: DC
  Administered 2013-09-28 – 2013-10-05 (×8): 40 mg via ORAL
  Filled 2013-09-27 (×6): qty 1

## 2013-09-27 MED ORDER — SODIUM CHLORIDE 0.9 % IJ SOLN
3.0000 mL | Freq: Two times a day (BID) | INTRAMUSCULAR | Status: DC
  Administered 2013-09-27 – 2013-10-01 (×9): 3 mL via INTRAVENOUS
  Administered 2013-10-02: 11:00:00 via INTRAVENOUS
  Administered 2013-10-03 – 2013-10-05 (×6): 3 mL via INTRAVENOUS

## 2013-09-27 MED ORDER — BUDESONIDE-FORMOTEROL FUMARATE 160-4.5 MCG/ACT IN AERO
2.0000 | INHALATION_SPRAY | Freq: Two times a day (BID) | RESPIRATORY_TRACT | Status: DC
  Administered 2013-09-27 – 2013-10-05 (×16): 2 via RESPIRATORY_TRACT
  Filled 2013-09-27 (×2): qty 6

## 2013-09-27 MED ORDER — ALBUTEROL SULFATE (2.5 MG/3ML) 0.083% IN NEBU
2.5000 mg | INHALATION_SOLUTION | Freq: Four times a day (QID) | RESPIRATORY_TRACT | Status: DC | PRN
  Administered 2013-09-27: 2.5 mg via RESPIRATORY_TRACT
  Filled 2013-09-27: qty 3

## 2013-09-27 MED ORDER — ISOSORBIDE MONONITRATE 15 MG HALF TABLET
15.0000 mg | ORAL_TABLET | Freq: Every day | ORAL | Status: DC
  Administered 2013-09-27 – 2013-09-28 (×2): 15 mg via ORAL
  Filled 2013-09-27 (×2): qty 1

## 2013-09-27 MED ORDER — SODIUM CHLORIDE 0.9 % IV SOLN: 250.0000 mL | INTRAVENOUS | Status: DC | PRN

## 2013-09-27 NOTE — Progress Notes (Signed)
Date:  09/27/2013   ID:  [name], DOB 08-Sep-1940, MRN [id number]  PCP:  Horatio Pel, MD  Primary Cardiologist:  Claiborne Billings     History of Present Illness: [name] is a 73 y.o. 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 Glade Lloyd. Repeat catheterization in 2011 showed total occlusion of the RCA within the previously stented segment. Dr Claiborne Billings successfully reopened this vessel and also performed PTCA distal to his previously placed stents. He did have concomitant CAD of the LAD and circumflex vessels. 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 He was found to be in atrial fibrillation in March 2014 and at that time was started on Eliquis anticoagulation and rate control medications. A cardioversion was performed on 06/07/2012 with restoration of sinus rhythm. Unfortunately, he did not hold and developed recurrent AF. Due to his COPD Dr Claiborne Billings was concerned about initiating amiodarone and then started him on Multaq 400 mg twice a day and further titrated his Bystolic. He was set up for a an attempt at another cardioversion in [name] 2014 but prior to this was hospitalized with a COPD exacerbation and his elective cardioversion was canceled. While on Multaq, another attempt at cardioversion was done by Dr. Debara Pickett which was unsuccessful and the thought process was for him to stay in permanent atrial fibrillation.   Patient was being seen by wound care doctor due to the cat bites on his left lower extremity. They noticed his heart rate was elevated and recommended he come see his cardiologist. Patient states he feels some dizziness upon standing. No shortness of breath more than usual although his family indicated he seemed more short of breath. He has had worsening lower extremity edema in the last month. He sleeps  with a CPAP for obstructive sleep apnea.  The patient currently denies nausea, vomiting, fever, chest pain, orthopnea, PND, cough, congestion, abdominal pain, hematochezia, melena, .  Wt Readings from Last 3 Encounters:  09/27/13 201 lb 11.2 oz (91.491 kg)  09/11/13 188 lb (85.276 kg)  08/14/13 183 lb 12.8 oz (83.371 kg)     Past Medical History  Diagnosis Date  . COPD (chronic obstructive pulmonary disease)   . Active smoker   . Obesity   . Coronary artery disease   . Benign neoplasm of colon   . Atrial fibrillation   . Pyelonephritis, unspecified   . Other and unspecified hyperlipidemia   . Personal history of peptic ulcer disease   . Elevated prostate specific antigen (PSA)   . Diaphragmatic hernia without mention of obstruction or gangrene   . Esophageal reflux   . Tobacco use disorder   . Unspecified essential hypertension   . Peripheral vascular disease   . Sleep apnea     managed by Dr. Corky Downs    Current Outpatient Prescriptions  Medication Sig Dispense Refill  . albuterol (PROVENTIL HFA;VENTOLIN HFA) 108 (90 BASE) MCG/ACT inhaler Inhale 1 puff into the lungs every 6 (six) hours as needed for wheezing or shortness of breath.      Marland Kitchen apixaban (ELIQUIS) 5 MG TABS tablet Take 5 mg by mouth 2 (two) times daily.      Marland Kitchen aspirin 81 MG tablet Take 81 mg by mouth daily.        Marland Kitchen atorvastatin (LIPITOR) 40 MG tablet Take 40 mg by mouth at bedtime.       Marland Kitchen  budesonide-formoterol (SYMBICORT) 160-4.5 MCG/ACT inhaler Inhale 2 puffs into the lungs 2 (two) times daily.  1 Inhaler  12  . digoxin (LANOXIN) 0.125 MG tablet Take 1 tablet (0.125 mg total) by mouth daily.  90 tablet  3  . diltiazem (TIAZAC) 360 MG 24 hr capsule Take 360 mg by mouth daily.      . furosemide (LASIX) 40 MG tablet Take 40 mg by mouth daily.      . hydrochlorothiazide (MICROZIDE) 12.5 MG capsule Take 12.5 mg by mouth daily.      . isosorbide mononitrate (IMDUR) 30 MG 24 hr tablet Take 15 mg by mouth daily.       . Multiple Vitamins-Minerals (MULTIVITAMIN WITH MINERALS) tablet Take 1 tablet by mouth daily.        . nebivolol (BYSTOLIC) 10 MG tablet Take 20mg  in the morning and 10mg  in the evening.      . pantoprazole (PROTONIX) 40 MG tablet Take 40 mg by mouth daily.       . prednisoLONE acetate (PRED FORTE) 1 % ophthalmic suspension Place 1 drop into the right eye 4 (four) times daily.       . predniSONE (DELTASONE) 10 MG tablet Take 4 tabs daily x 2 days, 3 tabs daily x 2 days, 2 tabs daily x 2 days, 1 tab daily x 2 days, then stop  20 tablet  0  . tiotropium (SPIRIVA) 18 MCG inhalation capsule Place 18 mcg into inhaler and inhale daily.         No current facility-administered medications for this visit.   Family History  Problem Relation Age of Onset  . Heart disease Mother   . Coronary artery disease Father   . Emphysema Father     Allergies:    Allergies  Allergen Reactions  . Ranitidine Hcl     REACTION: throat swelling, difficulty breathing  . Beta Adrenergic Blockers     REACTION: sexual side effects  . Ramipril     REACTION: throat swelling  . Sulfonamide Derivatives     REACTION: childhood reation of hematuria  . Ticlopidine Hcl     REACTION: swelling    Social History:  The patient  reports that he has been smoking Cigarettes.  He has a 55 pack-year smoking history. He has never used smokeless tobacco. He reports that he drinks about 1.8 ounces of alcohol per week. He reports that he does not use illicit drugs.   Family history:   Family History  Problem Relation Age of Onset  . Heart disease Mother   . Coronary artery disease Father   . Emphysema Father     ROS:  Please see the history of present illness.  All other systems reviewed and negative.   PHYSICAL EXAM: VS:  BP 151/107  Pulse 163  Ht 5\' 11"  (1.803 m)  Wt 201 lb 11.2 oz (91.491 kg)  BMI 28.14 kg/m2 Well nourished, well developed, in no acute distress, he is wearing oxygen HEENT: Pupils are equal round  react to light accommodation extraocular movements are intact.  Neck: no JVDNo cervical lymphadenopathy. Cardiac: Irregular rate and rhythm no murmurs. Lungs:  Substantial decrease in breath sounds bilaterally no rales noted. Abd: soft, nontender, positive bowel sounds all quadrants, no hepatosplenomegaly Ext: 3+ lower extremity edema up into his thighs.  2+ radial pulses. Skin: warm and dry Neuro:  Grossly normal  EKG:  Atrial fibrillation with a rate in the 147 beats per minute  ASSESSMENT AND PLAN:  Problem  List Items Addressed This Visit   Chronic anticoagulation - Eliquis - Primary (Chronic)   Atrial fibrillation with RVR     Patient be admitted for IV medication for rate control.  We'll start him on IV diltiazem and continue liquids. As well as diastolic and digoxin. Check electrolytes. Check TSH.    Acute diastolic heart failure,      Patient's noted worsening lower extremity edema for the last month. It is about 3+. Is likely secondary to rapid A. fib. We'll start him on IV Lasix. Monitor kidney function.

## 2013-09-27 NOTE — Progress Notes (Signed)
Pt place on auto CPAP with full face mask and 2.5L of O2 bleed in.

## 2013-09-27 NOTE — Patient Instructions (Signed)
You are being Admitted today go to [facility] you will have a bed at [place]

## 2013-09-27 NOTE — H&P (Signed)
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 Glade Lloyd. Repeat catheterization in 2011 showed total occlusion of the RCA within the previously stented segment. Dr Claiborne Billings successfully reopened this vessel and also performed PTCA distal to his previously placed stents. He did have concomitant CAD of the LAD and circumflex vessels. 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 He was found to be in atrial fibrillation in March 2014 and at that time was started on Eliquis anticoagulation and rate control medications. A cardioversion was performed on 06/07/2012 with restoration of sinus rhythm. Unfortunately, he did not hold and developed recurrent AF. Due to his COPD Dr Claiborne Billings was concerned about initiating amiodarone and then started him on Multaq 400 mg twice a day and further titrated his Bystolic. He was set up for a an attempt at another cardioversion in [name] 2014 but prior to this was hospitalized with a COPD exacerbation and his elective cardioversion was canceled. While on Multaq, another attempt at cardioversion was done by Dr. Debara Pickett which was unsuccessful and the thought process was for him to stay in permanent atrial fibrillation.  Patient was being seen by wound care doctor due to the cat bites on his left lower extremity. They noticed his heart rate was elevated and recommended he come see his cardiologist. Patient states he feels some dizziness upon standing. No shortness of breath more than usual although his family indicated he seemed more short of breath. He has had worsening lower extremity edema in the last month. He sleeps with a CPAP for  obstructive sleep apnea.  The patient currently denies nausea, vomiting, fever, chest pain, orthopnea, PND, cough, congestion, abdominal pain, hematochezia, melena, .  Wt Readings from Last 3 Encounters:   09/27/13  201 lb 11.2 oz (91.491 kg)   09/11/13  188 lb (85.276 kg)   08/14/13  183 lb 12.8 oz (83.371 kg)    Past Medical History   Diagnosis  Date   .  COPD (chronic obstructive pulmonary disease)    .  Active smoker    .  Obesity    .  Coronary artery disease    .  Benign neoplasm of colon    .  Atrial fibrillation    .  Pyelonephritis, unspecified    .  Other and unspecified hyperlipidemia    .  Personal history of peptic ulcer disease    .  Elevated prostate specific antigen (PSA)    .  Diaphragmatic hernia without mention of obstruction or gangrene    .  Esophageal reflux    .  Tobacco use disorder    .  Unspecified essential hypertension    .  Peripheral vascular disease    .  Sleep apnea      managed by Dr. Corky Downs    Current Outpatient Prescriptions   Medication  Sig  Dispense  Refill   .  albuterol (PROVENTIL HFA;VENTOLIN HFA) 108 (90 BASE) MCG/ACT inhaler  Inhale 1 puff into the lungs every 6 (six) hours as needed for wheezing or shortness of breath.     Marland Kitchen  apixaban (ELIQUIS) 5 MG TABS tablet  Take 5 mg by mouth 2 (two)  times daily.     Marland Kitchen  aspirin 81 MG tablet  Take 81 mg by mouth daily.     Marland Kitchen  atorvastatin (LIPITOR) 40 MG tablet  Take 40 mg by mouth at bedtime.     .  budesonide-formoterol (SYMBICORT) 160-4.5 MCG/ACT inhaler  Inhale 2 puffs into the lungs 2 (two) times daily.  1 Inhaler  12   .  digoxin (LANOXIN) 0.125 MG tablet  Take 1 tablet (0.125 mg total) by mouth daily.  90 tablet  3   .  diltiazem (TIAZAC) 360 MG 24 hr capsule  Take 360 mg by mouth daily.     .  furosemide (LASIX) 40 MG tablet  Take 40 mg by mouth daily.     .  hydrochlorothiazide (MICROZIDE) 12.5 MG capsule  Take 12.5 mg by mouth daily.     .  isosorbide mononitrate (IMDUR) 30 MG 24 hr  tablet  Take 15 mg by mouth daily.     .  Multiple Vitamins-Minerals (MULTIVITAMIN WITH MINERALS) tablet  Take 1 tablet by mouth daily.     .  nebivolol (BYSTOLIC) 10 MG tablet  Take 20mg  in the morning and 10mg  in the evening.     .  pantoprazole (PROTONIX) 40 MG tablet  Take 40 mg by mouth daily.     .  prednisoLONE acetate (PRED FORTE) 1 % ophthalmic suspension  Place 1 drop into the right eye 4 (four) times daily.     .  predniSONE (DELTASONE) 10 MG tablet  Take 4 tabs daily x 2 days, 3 tabs daily x 2 days, 2 tabs daily x 2 days, 1 tab daily x 2 days, then stop  20 tablet  0   .  tiotropium (SPIRIVA) 18 MCG inhalation capsule  Place 18 mcg into inhaler and inhale daily.      No current facility-administered medications for this visit.    Family History   Problem  Relation  Age of Onset   .  Heart disease  Mother    .  Coronary artery disease  Father    .  Emphysema  Father    Allergies:  Allergies   Allergen  Reactions   .  Ranitidine Hcl      REACTION: throat swelling, difficulty breathing   .  Beta Adrenergic Blockers      REACTION: sexual side effects   .  Ramipril      REACTION: throat swelling   .  Sulfonamide Derivatives      REACTION: childhood reation of hematuria   .  Ticlopidine Hcl      REACTION: swelling   Social History: The patient reports that he has been smoking Cigarettes. He has a 55 pack-year smoking history. He has never used smokeless tobacco. He reports that he drinks about 1.8 ounces of alcohol per week. He reports that he does not use illicit drugs.  Family history:  Family History   Problem  Relation  Age of Onset   .  Heart disease  Mother    .  Coronary artery disease  Father    .  Emphysema  Father    ROS: Please see the history of present illness. All other systems reviewed and negative.   PHYSICAL EXAM:  VS: BP 151/107  Pulse 163  Ht 5\' 11"  (1.803 m)  Wt 201 lb 11.2 oz (91.491 kg)  BMI 28.14 kg/m2  Well nourished, well developed, in no  acute distress, he is wearing oxygen  HEENT:  Pupils are equal round react to light accommodation extraocular movements are intact.  Neck: no JVD No cervical lymphadenopathy.  Cardiac: Irregular rate and rhythm no murmurs.  Lungs: Substantial decrease in breath sounds bilaterally no rales noted.  Abd: soft, nontender, positive bowel sounds all quadrants, no hepatosplenomegaly  Ext: 3+ lower extremity edema up into his thighs. 2+ radial pulses.  Skin: warm and dry  Neuro: Grossly normal  EKG: Atrial fibrillation with a rate in the 147 beats per minute   ASSESSMENT AND PLAN:  Problem List Items Addressed This Visit    Chronic anticoagulation - Eliquis - Primary (Chronic)    Atrial fibrillation with RVR,      he is chronically in A. fib. Patient be admitted for IV medication for rate control. We'll start him on IV diltiazem and continue liquids. As well as diastolic and digoxin. Check electrolytes. Check TSH.    Acute diastolic heart failure,     Patient's noted worsening lower extremity edema for the last month. It is about 3+. Is likely secondary to rapid A. fib. We'll start him on IV Lasix. Monitor kidney function.        COPD   Chronic oxygen therapy   Hypertension   Obstructive sleep apnea: Continue CPAP   CAT bite left lower extremity from February of 2015.

## 2013-09-27 NOTE — H&P (Signed)
Patient seen and examined and history reviewed. Agree with above findings and plan. Pleasant WM with advanced COPD on oxygen therapy and chronic bronchitis presents with rapid ventricular response with Afib despite oral therapy with Bystolic, diltiazem, and digoxin. Significant 3 + lower extremity edema. HR in excess of 140 bpm. Recommend hospitalization for rate control with IV diltiazem. Needs IV diuresis for acute CHF. Probably not a candidate for restoration of NSR since he has been in Afib for over one year. Update Echo.  Peter Martinique, [place] 09/27/2013 2:46 PM

## 2013-09-27 NOTE — Assessment & Plan Note (Addendum)
Patient's noted worsening lower extremity edema for the last month. It is about 3+. Is likely secondary to rapid A. fib. We'll start him on IV Lasix. Monitor kidney function.

## 2013-09-27 NOTE — Assessment & Plan Note (Signed)
Patient be admitted for IV medication for rate control.  We'll start him on IV diltiazem and continue liquids. As well as diastolic and digoxin. Check electrolytes. Check TSH.

## 2013-09-28 ENCOUNTER — Inpatient Hospital Stay (HOSPITAL_COMMUNITY): Payer: Medicare Other

## 2013-09-28 DIAGNOSIS — I4891 Unspecified atrial fibrillation: Secondary | ICD-10-CM

## 2013-09-28 DIAGNOSIS — I5031 Acute diastolic (congestive) heart failure: Secondary | ICD-10-CM

## 2013-09-28 LAB — GLUCOSE, CAPILLARY: GLUCOSE-CAPILLARY: 108 mg/dL — AB (ref 70–99)

## 2013-09-28 LAB — BASIC METABOLIC PANEL
Anion gap: 10 (ref 5–15)
BUN: 41 mg/dL — ABNORMAL HIGH (ref 6–23)
CO2: 25 meq/L (ref 19–32)
CREATININE: 2.28 mg/dL — AB (ref 0.50–1.35)
Calcium: 8.6 mg/dL (ref 8.4–10.5)
Chloride: 106 mEq/L (ref 96–112)
GFR calc Af Amer: 31 mL/min — ABNORMAL LOW (ref >90)
GFR, EST NON AFRICAN AMERICAN: 27 mL/min — AB (ref >90)
GLUCOSE: 105 mg/dL — AB (ref 70–99)
Potassium: 5.2 mEq/L (ref 3.7–5.3)
SODIUM: 141 meq/L (ref 137–147)

## 2013-09-28 MED ORDER — DILTIAZEM HCL 30 MG PO TABS
30.0000 mg | ORAL_TABLET | Freq: Four times a day (QID) | ORAL | Status: DC
  Administered 2013-09-28 – 2013-09-29 (×4): 30 mg via ORAL
  Filled 2013-09-28 (×8): qty 1

## 2013-09-28 NOTE — Progress Notes (Signed)
Nutrition Brief Note  Patient identified on the Malnutrition Screening Tool (MST) Report  Wt Readings from Last 15 Encounters:  09/28/13 200 lb 14.4 oz (91.128 kg)  09/27/13 201 lb 11.2 oz (91.491 kg)  09/11/13 188 lb (85.276 kg)  08/14/13 183 lb 12.8 oz (83.371 kg)  07/18/13 175 lb (79.379 kg)  07/18/13 175 lb (79.379 kg)  07/04/13 175 lb (79.379 kg)  05/03/13 164 lb 8 oz (74.617 kg)  04/24/13 172 lb 9.6 oz (78.291 kg)  04/10/13 183 lb (83.008 kg)  03/13/13 182 lb 12.8 oz (82.918 kg)  03/05/13 187 lb 9.8 oz (85.1 kg)  03/05/13 187 lb 9.8 oz (85.1 kg)  03/04/13 188 lb 9.6 oz (85.548 kg)  02/20/13 187 lb (84.823 kg)    Body mass index is 28.03 kg/(m^2). Patient meets criteria for overweight based on current BMI.   Current diet order is 2g sodium, patient is consuming approximately 100% of meals at this time. Labs and medications reviewed. Pt admitted with atrial fibrillation with RVR and acute diastolic heart failure. Hx of CAD.   - Met with pt who reports good appetite PTA, eating 3 meals/day at home - States he's gained 20 pounds of fluid in the past 3 months - Admits to eating a lot of high sodium and processed foods - Low sodium diet discussed and reviewed with pt who expressed understanding - denied needing any diet handouts   No nutrition interventions warranted at this time. If nutrition issues arise, please consult RD.   Carlis Stable MS, RD, LDN [phone] Weekend/After Hours Pager

## 2013-09-28 NOTE — Progress Notes (Signed)
Patient places themselves on CPAP. Instructed that if any issues arise to call Rt and we would continue to assist as needed.

## 2013-09-28 NOTE — Progress Notes (Signed)
Upon AM assessment, hr noted to be in the high 50's a-fib BP 100/54. Cardizem drip turned off. D Dunn PA made aware.

## 2013-09-28 NOTE — Progress Notes (Signed)
Patient ID: [name], male   DOB: 1940-03-08, 73 y.o.   MRN: [id number]     Primary cardiologist:  Subjective:      Objective:   Temp:  [97.2 F (36.2 C)-97.6 F (36.4 C)] 97.2 F (36.2 C) (08/29 0640) Pulse Rate:  [59-122] 59 (08/29 0737) Resp:  [16-24] 19 (08/29 0640) BP: (90-146)/(52-110) 100/52 mmHg (08/29 0737) SpO2:  [95 %-97 %] 95 % (08/29 1002) Weight:  [200 lb 14.4 oz (91.128 kg)-202 lb 9.6 oz (91.9 kg)] 200 lb 14.4 oz (91.128 kg) (08/29 0640) Last BM Date: 09/27/13  Filed Weights   09/27/13 1457 09/28/13 0640  Weight: 202 lb 9.6 oz (91.9 kg) 200 lb 14.4 oz (91.128 kg)    Intake/Output Summary (Last 24 hours) at 09/28/13 1302 Last data filed at 09/28/13 0905  Gross per 24 hour  Intake 1114.17 ml  Output   1275 ml  Net -160.83 ml    Telemetry:  Exam:  General:  HEENT:  Resp:  Cardiac:  GI:  MSK:  Neuro:   Psych  Lab Results:  Basic Metabolic Panel:  Recent Labs Lab 09/27/13 1400 09/28/13 0338  NA 142 141  K 4.8 5.2  CL 105 106  CO2 24 25  GLUCOSE 183* 105*  BUN 39* 41*  CREATININE 2.12* 2.28*  CALCIUM 8.8 8.6  MG 1.7  --     Liver Function Tests: No results found for this basename: AST, ALT, ALKPHOS, BILITOT, PROT, ALBUMIN,  in the last 168 hours  CBC:  Recent Labs Lab 09/27/13 1400  WBC 6.6  HGB 12.6*  HCT 39.5  MCV 90.4  PLT 125*    Cardiac Enzymes: No results found for this basename: CKTOTAL, CKMB, CKMBINDEX, TROPONINI,  in the last 168 hours  BNP:  Recent Labs  03/05/13 1005  PROBNP 5009.0*    Coagulation: No results found for this basename: INR,  in the last 168 hours  ECG:   Medications:   Scheduled Medications: . apixaban  5 mg Oral BID  . aspirin EC  81 mg Oral Daily  . atorvastatin  40 mg Oral QHS  . budesonide-formoterol  2 puff Inhalation BID  . digoxin  0.125 mg Oral QHS  . furosemide  40 mg Intravenous BID  . isosorbide mononitrate  15 mg Oral Daily  . nebivolol  10 mg Oral  q1800  . nebivolol  20 mg Oral Daily  . pantoprazole  40 mg Oral Daily  . potassium chloride  20 mEq Oral BID  . sodium chloride  3 mL Intravenous Q12H  . tiotropium  18 mcg Inhalation Daily     Infusions: . diltiazem (CARDIZEM) infusion 10 mg/hr (09/27/13 2202)     PRN Medications:  sodium chloride, acetaminophen, albuterol, ondansetron (ZOFRAN) IV, sodium chloride     Assessment/Plan    73 yo male hx of CAD with prior stenting, COPD on home O2, HTN, HL, OSA, PAF admitted with afib with RVR.   1. Afib - prior DCCV 05/2012, went back into afib - had been on multaq, repeat DCCV was unsuccesful.  - admitted with afib with RVR, started on dilt gtt. Rates improved, now off dilt gtt. Currently on digoxin 0093 mg daily, bystolic 20 in AM and 10 in pm.  - on eliquis TSH 1, dig level <0.3, Mg 1.7, K 4.8, Cr 2.1 - will start short acting dilt, given his poor renal function stop digoxin. Hold imdur to allow room with bp for dilt.    2.  Acute diastolic HF - echo 01/7614 LVEF 55-60%, could not assess diastolic function due to afib - evidence of severe volume overload by exam - currently on lasix 40mg  IV bid, I/Os report negative 400 mL. Continue for now, follow renal function closely.  - order CXR   3. CKD - baseline Cr from 6 months 1.7-1.8, mildly increased on admit. Unclear if secondary to diastolic HF and venous congestion, follow Cr with diuretics.     Carlyle Dolly, M.D., F.A.C.C.

## 2013-09-29 LAB — BASIC METABOLIC PANEL
Anion gap: 11 (ref 5–15)
BUN: 40 mg/dL — AB (ref 6–23)
CO2: 26 meq/L (ref 19–32)
Calcium: 8.3 mg/dL — ABNORMAL LOW (ref 8.4–10.5)
Chloride: 104 mEq/L (ref 96–112)
Creatinine, Ser: 2.44 mg/dL — ABNORMAL HIGH (ref 0.50–1.35)
GFR calc Af Amer: 29 mL/min — ABNORMAL LOW (ref >90)
GFR calc non Af Amer: 25 mL/min — ABNORMAL LOW (ref >90)
Glucose, Bld: 99 mg/dL (ref 70–99)
Potassium: 5.4 mEq/L — ABNORMAL HIGH (ref 3.7–5.3)
SODIUM: 141 meq/L (ref 137–147)

## 2013-09-29 LAB — MAGNESIUM: Magnesium: 1.7 mg/dL (ref 1.5–2.5)

## 2013-09-29 MED ORDER — DILTIAZEM HCL ER COATED BEADS 120 MG PO CP24
120.0000 mg | ORAL_CAPSULE | Freq: Every day | ORAL | Status: DC
  Administered 2013-09-29: 120 mg via ORAL
  Filled 2013-09-29 (×2): qty 1

## 2013-09-29 NOTE — Progress Notes (Signed)
Patient: [name] / Admit Date: 09/27/2013 / Date of Encounter: 09/29/2013, 10:53 AM   Subjective: Feeling better, still not yet to baseline. LEE slowly improving.   Objective: Telemetry: atrial fib, rate controlled 70s, 5 beats NSVT Physical Exam: Blood pressure 116/70, pulse 90, temperature 98.1 F (36.7 C), temperature source Oral, resp. rate 18, height 5\' 11"  (1.803 m), weight 197 lb (89.359 kg), SpO2 92.00%. General: Well developed WM, in no acute distress. Head: Normocephalic, atraumatic, sclera non-icteric, no xanthomas, nares are without discharge. Neck: JVP not elevated. Lungs: Significantly BS at bases, otherwise decreased air movement throughout. No wheezes, rales, or rhonchi. Breathing is unlabored. Heart: Irregularly irregular, S1 S2 without murmurs, rubs, or gallops.  Abdomen: Soft, non-tender, non-distended with normoactive bowel sounds. No rebound/guarding. Extremities: No clubbing or cyanosis. 2+ BLE pitting edema up to just below knee. Neuro: Alert and oriented X 3. Moves all extremities spontaneously. Psych:  Responds to questions appropriately with a normal affect.   Intake/Output Summary (Last 24 hours) at 09/29/13 1053 Last data filed at 09/29/13 1039  Gross per 24 hour  Intake    838 ml  Output   2350 ml  Net  -1512 ml    Inpatient Medications:  . apixaban  5 mg Oral BID  . aspirin EC  81 mg Oral Daily  . atorvastatin  40 mg Oral QHS  . budesonide-formoterol  2 puff Inhalation BID  . diltiazem  30 mg Oral Q6H  . furosemide  40 mg Intravenous BID  . nebivolol  10 mg Oral q1800  . nebivolol  20 mg Oral Daily  . pantoprazole  40 mg Oral Daily  . sodium chloride  3 mL Intravenous Q12H  . tiotropium  18 mcg Inhalation Daily   Infusions:    Labs:  Recent Labs  09/27/13 1400 09/28/13 0338 09/29/13 0357  NA 142 141 141  K 4.8 5.2 5.4*  CL 105 106 104  CO2 24 25 26   GLUCOSE 183* 105* 99  BUN 39* 41* 40*  CREATININE 2.12* 2.28* 2.44*   CALCIUM 8.8 8.6 8.3*  MG 1.7  --   --    No results found for this basename: AST, ALT, ALKPHOS, BILITOT, PROT, ALBUMIN,  in the last 72 hours  Recent Labs  09/27/13 1400  WBC 6.6  NEUTROABS 4.9  HGB 12.6*  HCT 39.5  MCV 90.4  PLT 125*   No results found for this basename: CKTOTAL, CKMB, TROPONINI,  in the last 72 hours No components found with this basename: POCBNP,  No results found for this basename: HGBA1C,  in the last 72 hours   Radiology/Studies:  Dg Chest 2 View  09/28/2013   CLINICAL DATA:  73 year old male with shortness of breath.  EXAM: CHEST  2 VIEW  COMPARISON:  08/16/2012 and prior chest radiographs dating back to 10/21/2009.  FINDINGS: Cardiomegaly identified.  Mild interstitial opacities are compatible with interstitial edema.  A small small scratch a small bilateral pleural effusions are noted.  Mild bibasilar atelectasis is present.  There is no evidence of pneumothorax.  No acute bony abnormalities are identified.  IMPRESSION: Cardiomegaly with mild interstitial pulmonary edema and small bilateral pleural effusions.   Electronically Signed   By: Hassan Rowan M.D.   On: 09/28/2013 18:24     Assessment and Plan  73 yo male hx of CAD s/p stent to RCA 2003 and PTCA distal to previous stents in 2011, COPD on PRN home O2, HTN, HL, OSA, PAF admitted with  afib with RVR.   1. Chronic atrial fibrillation, admitted with RVR - failed prior rhythm control with Multaq, DCCVs - strategy is for rate control (on diltiazem, nebivolol), anticoag with Eliquis - change to long acting diltiazem today - we might end up titrating back to his home dose of 240mg  but for now HRs 50s-70s since yesterday, digoxin stopped due to renal function - TSH 1, dig level <0.3, Mg 1.7, K 4.8, Cr 2.1 - not sure he needs both aspirin and Eliquis - we can consider discussing with Dr. Claiborne Billings tomorrow  2. Acute diastolic CHF - repeat echocardiogram to make sure EF is still preserved - last echo 05/2012 EF  55-60%, could not assess diastolic function due to afib - per d/w Dr. Harl Bowie, continue IV Lasix as ordered and continue to follow Cr - PT eval to increase mobility  3. CKD stage III (with mild hyperkalemia 09/29/13) - baseline Cr from 6 months 1.7-1.8, mildly increased on admit. Unclear if secondary to diastolic HF and venous congestion, follow Cr with diuretics - with hyperkalemia this AM, will d/c KCl supplement  4. NSVT - as above, repeat echo - recheck Mg - continue to watch as K+ downtrends  Signed, Melina Copa PA-C  Attending Note Patient seen and discussed with NP Purcell Nails. Rates well controlled on bystolic and dilt, dig stopped due to poor renal function. Lots of room to go on titration of dilt if needed as opposed to continued dig. Continues to have severe volume overload, negative 1.6 liters since admit. Mild uptrend in Cr, will continue diuresis and follow closely, he is still severely overloaded. F/u repeat echo.    Zandra Abts MD

## 2013-09-29 NOTE — Progress Notes (Signed)
Pt places himself on and off cpap. If he needs any assistance, he knows to call RT. RT will continue to monitor.

## 2013-09-30 DIAGNOSIS — N183 Chronic kidney disease, stage 3 unspecified: Secondary | ICD-10-CM

## 2013-09-30 DIAGNOSIS — I369 Nonrheumatic tricuspid valve disorder, unspecified: Secondary | ICD-10-CM

## 2013-09-30 DIAGNOSIS — Z7901 Long term (current) use of anticoagulants: Secondary | ICD-10-CM

## 2013-09-30 DIAGNOSIS — J449 Chronic obstructive pulmonary disease, unspecified: Secondary | ICD-10-CM

## 2013-09-30 LAB — CBC
HEMATOCRIT: 36.2 % — AB (ref 39.0–52.0)
Hemoglobin: 11.3 g/dL — ABNORMAL LOW (ref 13.0–17.0)
MCH: 28 pg (ref 26.0–34.0)
MCHC: 31.2 g/dL (ref 30.0–36.0)
MCV: 89.6 fL (ref 78.0–100.0)
Platelets: 134 10*3/uL — ABNORMAL LOW (ref 150–400)
RBC: 4.04 MIL/uL — AB (ref 4.22–5.81)
RDW: 14.9 % (ref 11.5–15.5)
WBC: 6.1 10*3/uL (ref 4.0–10.5)

## 2013-09-30 LAB — BASIC METABOLIC PANEL
Anion gap: 10 (ref 5–15)
BUN: 44 mg/dL — AB (ref 6–23)
CO2: 28 meq/L (ref 19–32)
Calcium: 8.6 mg/dL (ref 8.4–10.5)
Chloride: 102 mEq/L (ref 96–112)
Creatinine, Ser: 2.51 mg/dL — ABNORMAL HIGH (ref 0.50–1.35)
GFR calc Af Amer: 28 mL/min — ABNORMAL LOW (ref >90)
GFR calc non Af Amer: 24 mL/min — ABNORMAL LOW (ref >90)
Glucose, Bld: 83 mg/dL (ref 70–99)
POTASSIUM: 4.9 meq/L (ref 3.7–5.3)
SODIUM: 140 meq/L (ref 137–147)

## 2013-09-30 MED ORDER — DILTIAZEM HCL ER COATED BEADS 180 MG PO CP24
180.0000 mg | ORAL_CAPSULE | Freq: Every day | ORAL | Status: DC
  Administered 2013-09-30 – 2013-10-05 (×6): 180 mg via ORAL
  Filled 2013-09-30 (×6): qty 1

## 2013-09-30 NOTE — Progress Notes (Signed)
  Echocardiogram 2D Echocardiogram has been performed.  [name] 09/30/2013, 10:21 AM

## 2013-09-30 NOTE — Care Management Note (Addendum)
    Page 1 of 2   10/04/2013     2:57:51 PM CARE MANAGEMENT NOTE 10/04/2013  Patient:  [name], [name]   Account Number:  [id number]  Date Initiated:  09/30/2013  Documentation initiated by:  [facility]  Subjective/Objective Assessment:   73 yo male presents with rapid ventricular response with Afib despite oral therapy with Bystolic, diltiazem, and digoxin. Significant 3 + lower extremity edema//Home with spouse.     Action/Plan:   Patient be admitted for IV medication for rate control. We'll start him on IV diltiazem and continue liquids. As well as diastolic and digoxin.//Access for disposition needs   Anticipated DC Date:  10/02/2013   Anticipated DC Plan:  [place]  CM consult      [facility] Choice  HOME HEALTH   Choice offered to / List presented to:  C-1 Patient        [place] arranged  HH-1 RN  [place] PT      [place] agency  [facility]   Status of service:  Completed, signed off Medicare Important Message given?  YES (If response is "NO", the following Medicare IM given date fields will be blank) Date Medicare IM given:  09/30/2013 Medicare IM given by:  [facility] Date Additional Medicare IM given:  10/04/2013 Additional Medicare IM given by:  Rin Gorton  Discharge Disposition:  [place]  Per UR Regulation:  Reviewed for med. necessity/level of care/duration of stay  If discussed at [place] of Stay Meetings, dates discussed:    Comments:  10/01/13 [place], RN, BSN, [employer] [phone] Spoke with pt at bedside regarding discharge planning for [facility]. Offered pt list of home health agencies to choose from.  Pt chose [facility] to render services. Christa See, RN of [facility] notified.  No DME needs identified at this time.

## 2013-09-30 NOTE — Clinical Documentation Improvement (Signed)
Possible Clinical Conditions? Acute Respiratory Failure Acute on Chronic Respiratory Failure Chronic Respiratory Failure Other Condition Cannot Clinically Determine   Supporting Information: (As per notes) "Pleasant WM with advanced COPD on oxygen therapy and chronic bronchitis"  Treatment:  (SPIRIVA) 18 MCG inhalation capsule [[id number]]          Dose: 18 mcg     (DELTASONE) 10 MG tablet [[id number]]          Dose: 10-40 mg Route: Oral Frequency: Daily at bedtime      Thank You, Alessandra Grout, RN, BSN, CCDS,Clinical Documentation Specialist:  [phone]  [phone]=[facility] [place]- Health Information Management

## 2013-09-30 NOTE — Progress Notes (Signed)
Patient: [name] / Admit Date: 09/27/2013 / Date of Encounter: 09/30/2013, 8:50 AM   Subjective:  No complaints. Still with pitting edema to knees.    Objective: Telemetry: atrial fib, rate controlled 70s, 5 beats NSVT Physical Exam: Blood pressure 116/68, pulse 92, temperature 98 F (36.7 C), temperature source Oral, resp. rate 16, height 5\' 11"  (1.803 m), weight 194 lb 14.2 oz (88.4 kg), SpO2 92.00%. General: Well developed WM, in no acute distress. Seen wearing CPAP Head: Normocephalic, atraumatic, sclera non-icteric, no xanthomas, nares are without discharge. Neck: ++JVP. Lungs: Significantly BS at bases, otherwise decreased air movement throughout. No wheezes, rales, or rhonchi. Breathing is unlabored. Heart: Irregularly irregular, S1 S2 without murmurs, rubs, or gallops.  Abdomen: Soft, non-tender. +distended with normoactive bowel sounds. No rebound/guarding. Extremities: No clubbing or cyanosis. 2+ BLE pitting edema up to knee. Neuro: Alert and oriented X 3. Moves all extremities spontaneously. Psych:  Responds to questions appropriately with a normal affect.   Intake/Output Summary (Last 24 hours) at 09/30/13 0850 Last data filed at 09/29/13 1926  Gross per 24 hour  Intake   1200 ml  Output    125 ml  Net   1075 ml    Inpatient Medications:  . apixaban  5 mg Oral BID  . aspirin EC  81 mg Oral Daily  . atorvastatin  40 mg Oral QHS  . budesonide-formoterol  2 puff Inhalation BID  . diltiazem  120 mg Oral Daily  . furosemide  40 mg Intravenous BID  . nebivolol  10 mg Oral q1800  . nebivolol  20 mg Oral Daily  . pantoprazole  40 mg Oral Daily  . sodium chloride  3 mL Intravenous Q12H  . tiotropium  18 mcg Inhalation Daily   Infusions:    Labs:  Recent Labs  09/27/13 1400  09/29/13 0357 09/30/13 0659  NA 142  < > 141 140  K 4.8  < > 5.4* 4.9  CL 105  < > 104 102  CO2 24  < > 26 28  GLUCOSE 183*  < > 99 83  BUN 39*  < > 40* 44*  CREATININE 2.12*  <  > 2.44* 2.51*  CALCIUM 8.8  < > 8.3* 8.6  MG 1.7  --  1.7  --   < > = values in this interval not displayed.   Recent Labs  09/27/13 1400 09/30/13 0659  WBC 6.6 6.1  NEUTROABS 4.9  --   HGB 12.6* 11.3*  HCT 39.5 36.2*  MCV 90.4 89.6  PLT 125* 134*   BNP (last 3 results)  Recent Labs  03/05/13 1005  PROBNP 5009.0*    Radiology/Studies:  Dg Chest 2 View  09/28/2013   CLINICAL DATA:  73 year old male with shortness of breath.  EXAM: CHEST  2 VIEW  COMPARISON:  08/16/2012 and prior chest radiographs dating back to 10/21/2009.  FINDINGS: Cardiomegaly identified.  Mild interstitial opacities are compatible with interstitial edema.  A small small scratch a small bilateral pleural effusions are noted.  Mild bibasilar atelectasis is present.  There is no evidence of pneumothorax.  No acute bony abnormalities are identified.  IMPRESSION: Cardiomegaly with mild interstitial pulmonary edema and small bilateral pleural effusions.       Assessment and Plan   73 yo male hx of CAD s/p stent to RCA 2003 and PTCA distal to previous stents in 2011, COPD on PRN home O2, HTN, HLD, OSA, PAF admitted on 09/27/13 with afib with RVR.  Chronic atrial fibrillation, admitted with RVR. Currently well controlled.  -- Failed prior rhythm control with Multaq, DCCVs -- Strategy is for rate control (on diltiazem, nebivolol), anticoag with Eliquis. Changed to long acting diltiazem on 09/29/13 (cardizem 120mg  qd). Digoxin discontinued due to renal function -- TSH 1 -- Currently on aspirin and Eliquis .   Acute diastolic CHF -- Repeat echocardiogram to make sure EF is still preserved- planned for today -- Last echo 05/2012 EF 55-60%, could not assess diastolic function due to afib -- Currently on IV Lasix 40mg  BID. Creatinine trending upwards with diuresis. Net neg -952 mL. Weight down 8 lbs. Still volume overloaded. -- PT eval to increase mobility  CKD stage III (with mild hyperkalemia 09/29/13) -- Baseline  Cr from 6 months 1.7-1.8, mildly increased on admit. Unclear if secondary to diastolic HF and venous congestion, creatinine trending upwards with diuresis. 2.12--> 2.28--> 2.44-->2.51 -- K supplementation was discontinued   NSVT- seen on telemetry ( 12 beats ) -- Magnesium stable at 1.7 -- K 5.4--> 4.9 today -- Continue to monitor    Signed, Angelena Form PA-C Pager [phone]   Patient seen and examined. Agree with assessment and plan. Pt is well known to me. Breathing ebtter. About to have an echo. Cr increased to 2.51 today. Still with 2+ pitting edema. Will check BNP. Increase Cardizem CD to 180 mg for improved rate control. With CAD continue low dose ASA with Eliquis.   Troy Sine, MD, [facility] 09/30/2013 9:27 AM

## 2013-09-30 NOTE — Progress Notes (Signed)
Pt has home cpap and places self on/off.  Rt will continmue to monitor.

## 2013-09-30 NOTE — Evaluation (Signed)
Physical Therapy Evaluation Patient Details Name: [name] MRN: [id number] DOB: 05-Jul-1940 Today's Date: 09/30/2013   History of Present Illness  73 y.o. male with h/o CAD, OSA on CPAP, COPD, admitted with a-fib, increased BLE edema, dizziness. Dx of acute diastolic heart failure. Pt has a cat bite wound on L calf which is being treated at outpt wound center.   Clinical Impression  **Pt admitted with *CHF**. Pt currently with functional limitations due to the deficits listed below (see PT Problem List).  Pt will benefit from skilled PT to increase their independence and safety with mobility to allow discharge to the venue listed below.   Pt ambulated 300' without assistive device. SaO2 walking on RA was 80-86%. SaO2 at rest on RA 96%.  Pt is steady with ambulation, encouraged pt to use O2 when walking.   *    Follow Up Recommendations Home health PT (for energy conservation)    Equipment Recommendations  None recommended by PT    Recommendations for Other Services       Precautions / Restrictions Precautions Precautions: Other (comment) Precaution Comments: monitor O2 Restrictions Weight Bearing Restrictions: No      Mobility  Bed Mobility Overal bed mobility: Independent                Transfers Overall transfer level: Independent                  Ambulation/Gait Ambulation/Gait assistance: Supervision Ambulation Distance (Feet): 300 Feet Assistive device: None Gait Pattern/deviations: WFL(Within Functional Limits)   Gait velocity interpretation: at or above normal speed for age/gender General Gait Details: Pt wanted to walk without O2. During ambulation SaO2 on RA was 80-86%. Instructed pt to use O2 when walking in the future.   Stairs            Wheelchair Mobility    Modified Rankin (Stroke Patients Only)       Balance Overall balance assessment: Independent                                            Pertinent Vitals/Pain Pain Assessment: No/denies pain    Home Living Family/patient expects to be discharged to:: Private residence Living Arrangements: Spouse/significant other Available Help at Discharge: Family;Available 24 hours/day Type of Home: House Home Access: Level entry     Home Layout: Two level;1/2 bath on main level;Bed/bath upstairs Home Equipment: Other (comment) (CPAP, O2)      Prior Function Level of Independence: Independent         Comments: uses O2 PRN     Hand Dominance        Extremity/Trunk Assessment   Upper Extremity Assessment: Overall WFL for tasks assessed           Lower Extremity Assessment: Overall WFL for tasks assessed (edema noted BLEs, per wife it has decreased since admission)      Cervical / Trunk Assessment: Normal  Communication   Communication: No difficulties  Cognition Arousal/Alertness: Awake/alert Behavior During Therapy: WFL for tasks assessed/performed Overall Cognitive Status: Within Functional Limits for tasks assessed                      General Comments      Exercises General Exercises - Lower Extremity Ankle Circles/Pumps: AROM;Both;15 reps;Supine      Assessment/Plan    PT Assessment Patient  needs continued PT services  PT Diagnosis Difficulty walking   PT Problem List Cardiopulmonary status limiting activity  PT Treatment Interventions Functional mobility training;Stair training;Therapeutic activities;Gait training;Patient/family education   PT Goals (Current goals can be found in the Care Plan section) Acute Rehab PT Goals Patient Stated Goal: to go home ASAP PT Goal Formulation: With patient Time For Goal Achievement: 10/14/13 Potential to Achieve Goals: Good    Frequency Min 3X/week   Barriers to discharge        Co-evaluation               End of Session Equipment Utilized During Treatment: Gait belt Activity Tolerance: Patient tolerated treatment  well Patient left: in bed;with call bell/phone within reach;with family/visitor present Nurse Communication: Mobility status         Time: 5852-7782 PT Time Calculation (min): 18 min   Charges:   PT Evaluation $Initial PT Evaluation Tier I: 1 Procedure PT Treatments $Gait Training: 8-22 mins   PT G Codes:          Philomena Doheny 09/30/2013, 1:51 PM [phone]

## 2013-09-30 NOTE — Consult Note (Addendum)
WOC wound consult note Reason for Consult: Consult requested for left leg wound.  Pt states he is followed by the outpatient wound care center and they have placed a skin substitute over a chronic full thickness wound and covered it with a compression wrap dressing.  This type of topical treatment should be left undisturbed for 1 week.  Patient is due to return to the outpatient wound care center on Wed for dressing change and assessment.  If he is still in the hospital at that time, WOC will remove and assess the site. Currently,  the compression wrap is intact without odor or drainage.  Pt can resume follow-up with the wound care center after discharge. Please re-consult if further assistance is needed.  Thank-you,  Julien Girt MSN, [place], [place], [place], [place]

## 2013-10-01 ENCOUNTER — Encounter (HOSPITAL_COMMUNITY): Payer: Self-pay | Admitting: Physician Assistant

## 2013-10-01 DIAGNOSIS — G4733 Obstructive sleep apnea (adult) (pediatric): Secondary | ICD-10-CM

## 2013-10-01 LAB — BASIC METABOLIC PANEL
Anion gap: 11 (ref 5–15)
BUN: 46 mg/dL — AB (ref 6–23)
CALCIUM: 8.5 mg/dL (ref 8.4–10.5)
CHLORIDE: 101 meq/L (ref 96–112)
CO2: 32 meq/L (ref 19–32)
CREATININE: 2.71 mg/dL — AB (ref 0.50–1.35)
GFR calc Af Amer: 25 mL/min — ABNORMAL LOW (ref >90)
GFR calc non Af Amer: 22 mL/min — ABNORMAL LOW (ref >90)
Glucose, Bld: 86 mg/dL (ref 70–99)
Potassium: 5 mEq/L (ref 3.7–5.3)
Sodium: 144 mEq/L (ref 137–147)

## 2013-10-01 LAB — GLUCOSE, CAPILLARY: Glucose-Capillary: 136 mg/dL — ABNORMAL HIGH (ref 70–99)

## 2013-10-01 LAB — PRO B NATRIURETIC PEPTIDE: Pro B Natriuretic peptide (BNP): 18365 pg/mL — ABNORMAL HIGH (ref 0–125)

## 2013-10-01 MED ORDER — ISOSORBIDE MONONITRATE 15 MG HALF TABLET
15.0000 mg | ORAL_TABLET | Freq: Every day | ORAL | Status: DC
  Administered 2013-10-01: 15 mg via ORAL
  Filled 2013-10-01 (×2): qty 1

## 2013-10-01 MED ORDER — FUROSEMIDE 40 MG PO TABS
40.0000 mg | ORAL_TABLET | Freq: Every day | ORAL | Status: DC
  Administered 2013-10-02 – 2013-10-05 (×4): 40 mg via ORAL
  Filled 2013-10-01 (×5): qty 1

## 2013-10-01 MED ORDER — HYDRALAZINE HCL 25 MG PO TABS
12.5000 mg | ORAL_TABLET | Freq: Two times a day (BID) | ORAL | Status: DC
  Administered 2013-10-01 (×2): 12.5 mg via ORAL
  Filled 2013-10-01 (×4): qty 0.5

## 2013-10-01 NOTE — Progress Notes (Signed)
Pt placed himself on CPAP at this time and is resting comfortably.

## 2013-10-01 NOTE — Progress Notes (Signed)
Pt requested that I  restate to his wife what Dr. Claiborne Billings had told him earlier this morning.  I talked about  the changes in BNP, Creatinine, GFR and ejection fraction, and that the medications were going to be adjusted.

## 2013-10-01 NOTE — Progress Notes (Signed)
Patient: [name] / Admit Date: 09/27/2013 / Date of Encounter: 10/01/2013, 7:30 AM   Subjective:  No complaints. Still with pitting edema to knees; however, slightly improved.    Objective: Telemetry: atrial fib, rate controlled 70s, 5 beats NSVT Physical Exam: Blood pressure 128/65, pulse 76, temperature 98.3 F (36.8 C), temperature source Oral, resp. rate 20, height 5\' 11"  (1.803 m), weight 189 lb 8 oz (85.957 kg), SpO2 99.00%. General: Well developed WM, in no acute distress. Seen wearing CPAP Head: Normocephalic, atraumatic, sclera non-icteric, no xanthomas, nares are without discharge. Neck: +JVP. Lungs: Significantly BS at bases, otherwise decreased air movement throughout. No wheezes, rales, or rhonchi. Breathing is unlabored. Heart: Irregularly irregular, S1 S2 without murmurs, rubs, or gallops.  Abdomen: Soft, non-tender. +distended with normoactive bowel sounds. No rebound/guarding. Extremities: No clubbing or cyanosis. 2+ BLE pitting edema up to knee. LLE wrapped.  Neuro: Alert and oriented X 3. Moves all extremities spontaneously. Psych:  Responds to questions appropriately with a normal affect.   Intake/Output Summary (Last 24 hours) at 10/01/13 0730 Last data filed at 10/01/13 0256  Gross per 24 hour  Intake    723 ml  Output    575 ml  Net    148 ml    Inpatient Medications:  . apixaban  5 mg Oral BID  . aspirin EC  81 mg Oral Daily  . atorvastatin  40 mg Oral QHS  . budesonide-formoterol  2 puff Inhalation BID  . diltiazem  180 mg Oral Daily  . furosemide  40 mg Intravenous BID  . nebivolol  10 mg Oral q1800  . nebivolol  20 mg Oral Daily  . pantoprazole  40 mg Oral Daily  . sodium chloride  3 mL Intravenous Q12H  . tiotropium  18 mcg Inhalation Daily   Infusions:    Labs:  Recent Labs  09/29/13 0357 09/30/13 0659 10/01/13 0458  NA 141 140 144  K 5.4* 4.9 5.0  CL 104 102 101  CO2 26 28 32  GLUCOSE 99 83 86  BUN 40* 44* 46*  CREATININE  2.44* 2.51* 2.71*  CALCIUM 8.3* 8.6 8.5  MG 1.7  --   --      Recent Labs  09/30/13 0659  WBC 6.1  HGB 11.3*  HCT 36.2*  MCV 89.6  PLT 134*   BNP (last 3 results)  Recent Labs  03/05/13 1005 10/01/13 0458  PROBNP 5009.0* 18365.0*    Radiology/Studies:  Dg Chest 2 View  09/28/2013   CLINICAL DATA:  73 year old male with shortness of breath.  EXAM: CHEST  2 VIEW  COMPARISON:  08/16/2012 and prior chest radiographs dating back to 10/21/2009.  FINDINGS: Cardiomegaly identified.  Mild interstitial opacities are compatible with interstitial edema.  A small small scratch a small bilateral pleural effusions are noted.  Mild bibasilar atelectasis is present.  There is no evidence of pneumothorax.  No acute bony abnormalities are identified.  IMPRESSION: Cardiomegaly with mild interstitial pulmonary edema and small bilateral pleural effusions.      2D ECHO: 09/30/2013; EF 45-50%: Mild concentric hypertrophy. No RWMAs. Compared to the prior study from 09/10/9145 the LV systolic function is now mildly impaired LVEF 45-50%, previously 60-65%. Right sided chanbers are dilated with mod dec RV function. Mod pulmonary HTN. LV EF: 45% - 50% Study Conclusions - Left ventricle: The cavity size was normal. There was mild concentric hypertrophy. Systolic function was mildly reduced. The estimated ejection fraction was in the range of 45% to  50%. Wall motion was normal; there were no regional wall motion abnormalities. - Aortic valve: Transvalvular velocity was within the normal range. There was no stenosis. There was no regurgitation. - Aortic root: The aortic root was normal in size. - Left atrium: The atrium was mildly dilated. - Right ventricle: The cavity size was mildly dilated. Wall thickness was normal. Systolic function was moderately reduced. - Right atrium: The atrium was moderately dilated. - Tricuspid valve: There was mild regurgitation. - Pulmonary arteries: Systolic pressure was  moderately increased. PA peak pressure: 46 mm Hg (S). Impressions: - Compared to the prior study from 05/17/2012 the left ventricular systolic function is now mildly impaired LVEF 45-50%, previosult 60-65%. Right sided chanbers are dilated with moderately decreased right ventricular function. Moderate pulmonary hypertension.      Assessment and Plan   73 yo male hx of CAD s/p stent to RCA 2003 and PTCA distal to previous stents in 2011, COPD on PRN home O2, HTN, HLD, OSA, PAF admitted on 09/27/13 with afib with RVR and acute CHF.   Chronic atrial fibrillation, admitted with RVR. Currently well controlled on Cardizem 180mg  -- Failed prior rhythm control with Multaq, DCCVs -- Strategy is for rate control (on diltiazem, nebivolol), anticoag with Eliquis. Changed to long acting diltiazem on 09/29/13 (cardizem 120mg  qd) and increased to 180 mg yesterday for improved rate control. Digoxin discontinued due to renal function -- TSH 1 -- Currently on aspirin and Eliquis due to concomitant CAD.   Acute on chronic systolic CHF- BNP 67,672. -- 2D ECHO: 09/30/2013; EF 45-50%: Mild concentric hypertrophy. No RWMAs. Compared to the prior study from 0/94/7096 the LV systolic function is now mildly impaired LVEF 45-50%, previously 60-65%. Right sided chanbers are dilated with mod dec RV function. Mod pulmonary HTN. -- Last echo 05/2012 EF 55-60%, could not assess diastolic function due to afib -- Currently on IV Lasix 40mg  BID. Creatinine trending upwards with diuresis. Net neg -804 mL. Weight down 13 lbs. Still volume overloaded. (I/Os inaccurate).  CKD stage III  -- Baseline Cr from 6 months 1.7-1.8, mildly increased on admit. Creatinine trending upwards with diuresis. 2.12--> 2.28--> 2.44-->2.51--> 2.71 today.  -- K supplementation was discontinued with hyperkalemia on admission (5.4)  NSVT- seen on telemetry yesterday. None detected today -- K 5 mag 1.7 -- Continue to monitor  Deconditioning- PT  evaluated and recommended home health PT  CAD- s/p stent to RCA 2003 and PTCA distal to previous stents in 2011 -- Low risk nuclear study in 2012 -- May need further ischemic work up with newly reduced EF to rule out ischemia as the cause.   LLE wound- seen by wound care nurse. Pt states he is followed by the outpatient wound care center and they have placed a skin substitute over a chronic full thickness wound and covered it with a compression wrap dressing. This type of topical treatment should be left undisturbed for 1 week. Patient is due to return to the outpatient wound care center on Wed for dressing change and assessment. If he is still in the hospital at that time, WOC will remove and assess the site   Signed, Angelena Form PA-C Pager [phone]    Patient seen and examined. Agree with assessment and plan. BNP significantly elevated at 18,365, decreased BS and persistent LE edema. Cr increased to 2.7, but age is < 80 and > 60 kilos. He is in AF. Will keep on current dose of Eliquis today but if renal function further deteriorates  will need to reduce dose to 2.5 mg bid. EF is reduced now that he is in AF. Will add low dose hydralazine at 12.5 mg bid/ isorsorbide 15 mg  today and titrate as BP allows and change to oral lasix.    Troy Sine, MD, [facility], [place] 10/01/2013 8:41 AM

## 2013-10-01 NOTE — Progress Notes (Signed)
Alert and responsive. Skin warm and dry. Some sob on exertion. Denies pain and discomfort. Edema 3+ to lower extremities.

## 2013-10-02 ENCOUNTER — Encounter (HOSPITAL_COMMUNITY): Payer: Self-pay | Admitting: Cardiology

## 2013-10-02 ENCOUNTER — Encounter (HOSPITAL_BASED_OUTPATIENT_CLINIC_OR_DEPARTMENT_OTHER): Payer: Medicare Other | Attending: General Surgery

## 2013-10-02 DIAGNOSIS — I2510 Atherosclerotic heart disease of native coronary artery without angina pectoris: Secondary | ICD-10-CM

## 2013-10-02 DIAGNOSIS — I872 Venous insufficiency (chronic) (peripheral): Secondary | ICD-10-CM | POA: Insufficient documentation

## 2013-10-02 DIAGNOSIS — J9610 Chronic respiratory failure, unspecified whether with hypoxia or hypercapnia: Secondary | ICD-10-CM | POA: Diagnosis present

## 2013-10-02 DIAGNOSIS — F17200 Nicotine dependence, unspecified, uncomplicated: Secondary | ICD-10-CM

## 2013-10-02 DIAGNOSIS — L97909 Non-pressure chronic ulcer of unspecified part of unspecified lower leg with unspecified severity: Secondary | ICD-10-CM | POA: Insufficient documentation

## 2013-10-02 HISTORY — DX: Chronic respiratory failure, unspecified whether with hypoxia or hypercapnia: J96.10

## 2013-10-02 LAB — CBC
HCT: 34.6 % — ABNORMAL LOW (ref 39.0–52.0)
HEMOGLOBIN: 11.1 g/dL — AB (ref 13.0–17.0)
MCH: 28.6 pg (ref 26.0–34.0)
MCHC: 32.1 g/dL (ref 30.0–36.0)
MCV: 89.2 fL (ref 78.0–100.0)
Platelets: 126 10*3/uL — ABNORMAL LOW (ref 150–400)
RBC: 3.88 MIL/uL — AB (ref 4.22–5.81)
RDW: 14.9 % (ref 11.5–15.5)
WBC: 6.5 10*3/uL (ref 4.0–10.5)

## 2013-10-02 LAB — BASIC METABOLIC PANEL
ANION GAP: 13 (ref 5–15)
BUN: 42 mg/dL — ABNORMAL HIGH (ref 6–23)
CHLORIDE: 103 meq/L (ref 96–112)
CO2: 26 meq/L (ref 19–32)
Calcium: 8.4 mg/dL (ref 8.4–10.5)
Creatinine, Ser: 2.49 mg/dL — ABNORMAL HIGH (ref 0.50–1.35)
GFR calc Af Amer: 28 mL/min — ABNORMAL LOW (ref >90)
GFR calc non Af Amer: 24 mL/min — ABNORMAL LOW (ref >90)
Glucose, Bld: 86 mg/dL (ref 70–99)
Potassium: 4.8 mEq/L (ref 3.7–5.3)
SODIUM: 142 meq/L (ref 137–147)

## 2013-10-02 LAB — PRO B NATRIURETIC PEPTIDE: Pro B Natriuretic peptide (BNP): 17439 pg/mL — ABNORMAL HIGH (ref 0–125)

## 2013-10-02 MED ORDER — HYDRALAZINE HCL 25 MG PO TABS
12.5000 mg | ORAL_TABLET | Freq: Three times a day (TID) | ORAL | Status: DC
  Administered 2013-10-02 – 2013-10-03 (×3): 12.5 mg via ORAL
  Filled 2013-10-02 (×6): qty 0.5

## 2013-10-02 MED ORDER — ISOSORBIDE MONONITRATE ER 30 MG PO TB24
30.0000 mg | ORAL_TABLET | Freq: Every day | ORAL | Status: DC
  Administered 2013-10-02: 30 mg via ORAL
  Filled 2013-10-02 (×2): qty 1

## 2013-10-02 NOTE — Consult Note (Addendum)
Wound care follow-up: Pt has Apligraft to left leg and is followed by the outpatient wound care center; he was due for an appointment today.  Removed compression wrap to assess site.  Wound is full thickness with graft intact, 1X2 cm 100% yellow with some odor and yellow drainage, both are characteristic for this type of topical treatment.  Silicone dressing left in place to protect and avoid disturbing graft site.  Continued present plan of care with gauze over silicone contact layer, then 3 layer Profore wrap; cotton, gauze and coban as previously applied.  Will plan to change dressing next Wed if still in the hospital at that time, otherwise patient can resume follow-up with the outpatient wound care center after discharge. Please re-consult if further assistance is needed.  Thank-you,  Julien Girt MSN, [place], [place], [place], [place]

## 2013-10-02 NOTE — Progress Notes (Addendum)
Subjective: No pain  Objective: Vital signs in last 24 hours: Temp:  [97.9 F (36.6 C)-98.4 F (36.9 C)] 98.2 F (36.8 C) (09/02 0728) Pulse Rate:  [66-88] 87 (09/02 0728) Resp:  [18-20] 18 (09/02 0728) BP: (108-120)/(55-70) 112/56 mmHg (09/02 0728) SpO2:  [95 %-98 %] 96 % (09/02 0728) Weight:  [185 lb 13.6 oz (84.3 kg)] 185 lb 13.6 oz (84.3 kg) (09/02 0416) Weight change: -3 lb 10.4 oz (-1.657 kg) Last BM Date: 10/01/13 Intake/Output from previous day: -533 (since admit -1337.8) wt down from 202 to 185 lbs  09/01 0701 - 09/02 0700 In: 1692 [P.O.:1692] Out: 2225 [Urine:2225] Intake/Output this shift:    PE: General:Pleasant affect, NAD Skin:Warm and dry, brisk capillary refill HEENT:normocephalic, sclera clear, mucus membranes moist Neck:supple, no JVD, no bruits  Heart:S1S2 RRR without murmur, gallup, rub or click Lungs:clear without rales, rhonchi, or wheezes XAJ:OINO, non tender, + BS, do not palpate liver spleen or masses Ext:2+LE edema r leg, less on left 2+ pedal pulses, 2+ radial pulses Neuro:alert and oriented, MAE, follows commands, + facial symmetry  TELE:  21 beats NSVT, afib Lab Results:  Recent Labs  09/30/13 0659 10/02/13 0630  WBC 6.1 6.5  HGB 11.3* 11.1*  HCT 36.2* 34.6*  PLT 134* 126*   BMET  Recent Labs  10/01/13 0458 10/02/13 0630  NA 144 142  K 5.0 4.8  CL 101 103  CO2 32 26  GLUCOSE 86 86  BUN 46* 42*  CREATININE 2.71* 2.49*  CALCIUM 8.5 8.4   No results found for this basename: TROPONINI, CK, MB,  in the last 72 hours  No results found for this basename: CHOL, HDL, LDLCALC, LDLDIRECT, TRIG, CHOLHDL   Lab Results  Component Value Date   HGBA1C 6.1* 09/29/2010     Lab Results  Component Value Date   TSH 1.000 09/27/2013     BNP (last 3 results)  Recent Labs  03/05/13 1005 10/01/13 0458 10/02/13 0630  PROBNP 5009.0* 18365.0* 17439.0*     Studies/Results: Echo: Left ventricle: The cavity size was  normal. There was mild concentric hypertrophy. Systolic function was mildly reduced. The estimated ejection fraction was in the range of 45% to 50%. Wall motion was normal; there were no regional wall motion abnormalities. - Aortic valve: Transvalvular velocity was within the normal range. There was no stenosis. There was no regurgitation. - Aortic root: The aortic root was normal in size. - Left atrium: The atrium was mildly dilated. - Right ventricle: The cavity size was mildly dilated. Wall thickness was normal. Systolic function was moderately reduced. - Right atrium: The atrium was moderately dilated. - Tricuspid valve: There was mild regurgitation. - Pulmonary arteries: Systolic pressure was moderately increased. PA peak pressure: 46 mm Hg (S).  Impressions:  - Compared to the prior study from 05/17/2012 the left ventricular systolic function is now mildly impaired LVEF 45-50%, previosult 60-65%. Right sided chanbers are dilated with moderately decreased right ventricular function. Moderate pulmonary hypertension.   Medications: I have reviewed the patient's current medications. Scheduled Meds: . apixaban  5 mg Oral BID  . aspirin EC  81 mg Oral Daily  . atorvastatin  40 mg Oral QHS  . budesonide-formoterol  2 puff Inhalation BID  . diltiazem  180 mg Oral Daily  . furosemide  40 mg Oral Daily  . hydrALAZINE  12.5 mg Oral BID  . isosorbide mononitrate  15 mg Oral Daily  . nebivolol  10  mg Oral q1800  . nebivolol  20 mg Oral Daily  . pantoprazole  40 mg Oral Daily  . sodium chloride  3 mL Intravenous Q12H  . tiotropium  18 mcg Inhalation Daily   Continuous Infusions:  PRN Meds:.sodium chloride, acetaminophen, albuterol, ondansetron (ZOFRAN) IV, sodium chloride  Assessment/Plan: 73 yo male hx of CAD s/p stent to RCA 2003 and PTCA distal to previous stents in 2011, COPD on PRN home O2, HTN, HLD, OSA, PAF admitted on 09/27/13 with afib with RVR and acute CHF.  Chronic  atrial fibrillation, admitted with RVR. Currently well controlled on Cardizem 180mg   -- Failed prior rhythm control with Multaq, DCCVs  -- Strategy is for rate control (on diltiazem, nebivolol), anticoag with Eliquis. Changed to long acting diltiazem on 09/29/13 (cardizem 120mg  qd) and increased to 180 mg yesterday for improved rate control. Digoxin discontinued due to renal function  -- TSH 1  -- Currently on aspirin and Eliquis due to concomitant CAD.  Acute on chronic systolic CHF- BNP 78,676.  -- 2D ECHO: 09/30/2013; EF 45-50%: Mild concentric hypertrophy. No RWMAs. Compared to the prior study from 08/19/9468 the LV systolic function is now mildly impaired LVEF 45-50%, previously 60-65%. Right sided chanbers are dilated with mod dec RV function. Mod pulmonary HTN.  -- Last echo 05/2012 EF 55-60%, could not assess diastolic function due to afib  -- Currently on poLasix 40mg  daily. Creatinine was trending upwards with diuresis. Now improved today Weight down 13 lbs. Still volume overloaded. (I/Os inaccurate).  Low dose biDil started.  No ACE or ARB due to renal insuff. CKD stage III  -- Baseline Cr from 6 months 1.7-1.8, mildly increased on admit. Creatinine trending upwards with diuresis. 2.12--> 2.28--> 2.44-->2.51--> 2.71--> today =2.49.  -- K supplementation was discontinued with hyperkalemia on admission (5.4)  Now at 4.8 NSVT- seen on telemetry last pm 21 beats. None detected today on 30 mg daily in divided dose -- K 5 mag 1.7  -- Continue to monitor  Deconditioning- PT evaluated and recommended home health PT  CAD- s/p stent to RCA 2003 and PTCA distal to previous stents in 2011  -- Low risk nuclear study in 2012  -- May need further ischemic work up with newly reduced EF to rule out ischemia as the cause.  LLE wound- seen by wound care nurse. Pt states he is followed by the outpatient wound care center and they have placed a skin substitute over a chronic full thickness wound and covered  it with a compression wrap dressing. This type of topical treatment should be left undisturbed for 1 week. Patient is due to return to the outpatient wound care center on Wed for dressing change and assessment. If he is still in the hospital at that time, WOC will remove and assess the site not going home today will consult wound care again. Tobacco abuse:  Still smokes Chronic resp failure: on home O2 for severe COPD, recent acute exacerbation on prednisone.    LOS: 5 days   Time spent with pt. :15 minutes. [facility] R  Nurse Practitioner Certified Pager [phone] or after 5pm and on weekends call [phone] 10/02/2013, 8:38 AM   Patient seen and examined. Agree with assessment and plan. Clinically feels improved; tolerating initiation of hydralazine/nitrates, will increase dose and frequency. Cr improved at 2.49 but still not at baseline. Will have wound care see today for weekly treatment. Telemetry reviewed with 21 beat irregular WCT yesterday. Discussed complete smoking cessation since he has not  been able to smoke while in the hospital.   Troy Sine, MD, [facility] 10/02/2013 9:02 AM

## 2013-10-02 NOTE — Progress Notes (Signed)
Physical Therapy Treatment Patient Details Name: [name] MRN: [id number] DOB: [birth year] Today's Date: October 09, 2013    History of Present Illness 73 y.o. male with h/o CAD, OSA on CPAP, COPD, admitted with a-fib, increased BLE edema, dizziness. Dx of acute diastolic heart failure. Pt has a cat bite wound on L calf which is being treated at outpt wound center.     PT Comments    Pt declining OOB or ambulation this session, but agreeable to therapeutic exercise with overall good rehab effort. Will continue to follow and progress as able per POC.   Follow Up Recommendations  Home health PT (energy conservation)     Equipment Recommendations  None recommended by PT    Recommendations for Other Services       Precautions / Restrictions Precautions Precautions: Fall Precaution Comments: monitor O2 Restrictions Weight Bearing Restrictions: No    Mobility  Bed Mobility Overal bed mobility: Independent                Transfers                    Ambulation/Gait                 Stairs            Wheelchair Mobility    Modified Rankin (Stroke Patients Only)       Balance                                    Cognition Arousal/Alertness: Awake/alert Behavior During Therapy: WFL for tasks assessed/performed Overall Cognitive Status: Within Functional Limits for tasks assessed                      Exercises Total Joint Exercises Bridges: 15 reps General Exercises - Lower Extremity Ankle Circles/Pumps: 15 reps Quad Sets: 15 reps Gluteal Sets: 15 reps Long Arc Quad: 15 reps Hip ABduction/ADduction: 15 reps Straight Leg Raises: 15 reps    General Comments        Pertinent Vitals/Pain Pain Assessment: No/denies pain    Home Living                      Prior Function            PT Goals (current goals can now be found in the care plan section) Acute Rehab PT Goals Patient Stated Goal: to go  home ASAP PT Goal Formulation: With patient Time For Goal Achievement: 10/14/13 Potential to Achieve Goals: Good Progress towards PT goals: Progressing toward goals    Frequency  Min 3X/week    PT Plan Current plan remains appropriate    Co-evaluation             End of Session Equipment Utilized During Treatment: Oxygen (Pt wore CPAP the entire session) Activity Tolerance: Patient tolerated treatment well Patient left: in bed;with call bell/phone within reach     Time: 1535-1547 PT Time Calculation (min): 12 min  Charges:  $Therapeutic Exercise: 8-22 mins                    G Codes:      Jolyn Lent Oct 09, 2013, 4:45 PM  Jolyn Lent, PT, DPT Acute Rehabilitation Services Pager: [phone]

## 2013-10-03 ENCOUNTER — Inpatient Hospital Stay (HOSPITAL_COMMUNITY): Payer: Medicare Other

## 2013-10-03 LAB — BASIC METABOLIC PANEL
Anion gap: 8 (ref 5–15)
BUN: 41 mg/dL — ABNORMAL HIGH (ref 6–23)
CO2: 32 meq/L (ref 19–32)
Calcium: 8.5 mg/dL (ref 8.4–10.5)
Chloride: 101 mEq/L (ref 96–112)
Creatinine, Ser: 2.44 mg/dL — ABNORMAL HIGH (ref 0.50–1.35)
GFR calc Af Amer: 29 mL/min — ABNORMAL LOW (ref >90)
GFR, EST NON AFRICAN AMERICAN: 25 mL/min — AB (ref >90)
Glucose, Bld: 88 mg/dL (ref 70–99)
Potassium: 4.5 mEq/L (ref 3.7–5.3)
Sodium: 141 mEq/L (ref 137–147)

## 2013-10-03 MED ORDER — CETYLPYRIDINIUM CHLORIDE 0.05 % MT LIQD
7.0000 mL | Freq: Two times a day (BID) | OROMUCOSAL | Status: DC
  Administered 2013-10-03 – 2013-10-05 (×5): 7 mL via OROMUCOSAL

## 2013-10-03 MED ORDER — ISOSORBIDE MONONITRATE ER 30 MG PO TB24
45.0000 mg | ORAL_TABLET | Freq: Every day | ORAL | Status: DC
  Administered 2013-10-03 – 2013-10-05 (×3): 45 mg via ORAL
  Filled 2013-10-03 (×3): qty 1

## 2013-10-03 MED ORDER — HYDRALAZINE HCL 25 MG PO TABS
25.0000 mg | ORAL_TABLET | Freq: Three times a day (TID) | ORAL | Status: DC
  Administered 2013-10-03 – 2013-10-05 (×7): 25 mg via ORAL
  Filled 2013-10-03 (×9): qty 1

## 2013-10-03 NOTE — Progress Notes (Signed)
Subjective: Feels stable but needs another day to continue to improve  Objective: Vital signs in last 24 hours: Temp:  [98.1 F (36.7 C)-98.3 F (36.8 C)] 98.1 F (36.7 C) (09/03 0516) Pulse Rate:  [75-86] 83 (09/03 0516) Resp:  [16-20] 18 (09/03 0516) BP: (108-125)/(54-78) 125/67 mmHg (09/03 0516) SpO2:  [95 %-98 %] 96 % (09/03 0516) Weight:  [183 lb 6.8 oz (83.2 kg)] 183 lb 6.8 oz (83.2 kg) (09/03 0516) Weight change: -2 lb 6.8 oz (-1.1 kg) Last BM Date: 10/01/13 Intake/Output from previous day: 09/02 0701 - 09/03 0700 In: 1300 [P.O.:1300] Out: 1051 [Urine:1051] Intake/Output this shift:    PE: General:Pleasant affect, NAD Skin:Warm and dry, brisk capillary refill HEENT:normocephalic, sclera clear, mucus membranes moist Neck:supple, no JVD, no bruits  Heart:S1S2 irreg irreg 1/6 sem Lungs:decreased BS R>L FHL:KTGY, non tender, + BS, do not palpate liver spleen or masses Ext: 2+ pitting RLE edema; L leg with dressing, 2+ pedal pulses, 2+ radial pulses Neuro:alert and oriented, MAE, follows commands, + facial symmetry   Lab Results:  Recent Labs  10/02/13 0630  WBC 6.5  HGB 11.1*  HCT 34.6*  PLT 126*   BMET  Recent Labs  10/02/13 0630 10/03/13 0442  NA 142 141  K 4.8 4.5  CL 103 101  CO2 26 32  GLUCOSE 86 88  BUN 42* 41*  CREATININE 2.49* 2.44*  CALCIUM 8.4 8.5   No results found for this basename: TROPONINI, CK, MB,  in the last 72 hours  No results found for this basename: CHOL, HDL, LDLCALC, LDLDIRECT, TRIG, CHOLHDL   Lab Results  Component Value Date   HGBA1C 6.1* 09/29/2010     Lab Results  Component Value Date   TSH 1.000 09/27/2013       Studies/Results: No results found.  Medications: I have reviewed the patient's current medications. Scheduled Meds: . apixaban  5 mg Oral BID  . aspirin EC  81 mg Oral Daily  . atorvastatin  40 mg Oral QHS  . budesonide-formoterol  2 puff Inhalation BID  . diltiazem  180 mg Oral  Daily  . furosemide  40 mg Oral Daily  . hydrALAZINE  12.5 mg Oral 3 times per day  . isosorbide mononitrate  30 mg Oral Daily  . nebivolol  10 mg Oral q1800  . nebivolol  20 mg Oral Daily  . pantoprazole  40 mg Oral Daily  . sodium chloride  3 mL Intravenous Q12H  . tiotropium  18 mcg Inhalation Daily   Continuous Infusions:  PRN Meds:.sodium chloride, acetaminophen, albuterol, ondansetron (ZOFRAN) IV, sodium chloride  Assessment/Plan: 73 yo male hx of CAD s/p stent to RCA 2003 and PTCA distal to previous stents in 2011, COPD on PRN home O2, HTN, HLD, OSA, PAF admitted on 09/27/13 with afib with RVR and acute CHF.  Chronic atrial fibrillation, admitted with RVR. Currently well controlled on Cardizem 180mg   -- Failed prior rhythm control with Multaq, DCCVs  -- Strategy is for rate control (on diltiazem, nebivolol), anticoag with Eliquis. Changed to long acting diltiazem on 09/29/13 (cardizem 120mg  qd) and increased to 180 mg yesterday for improved rate control. Digoxin discontinued due to renal function  -- TSH 1  -- Currently on aspirin and Eliquis due to concomitant CAD.  Acute on chronic systolic CHF- BNP 56,389.  -- 2D ECHO: 09/30/2013; EF 45-50%: Mild concentric hypertrophy. No RWMAs. Compared to the prior study from 3/73/4287 the LV systolic function  is now mildly impaired LVEF 45-50%, previously 60-65%. Right sided chanbers are dilated with mod dec RV function. Mod pulmonary HTN.  -- Last echo 05/2012 EF 55-60%, could not assess diastolic function due to afib  -- Currently on poLasix 40mg  daily. Creatinine was trending upwards with diuresis. Now improved today Weight down 13 lbs. Still volume overloaded. (I/Os inaccurate).  Low dose biDil started- gradually increasing . No ACE or ARB due to renal insuff.  CKD stage III  -- Baseline Cr from 6 months 1.7-1.8, mildly increased on admit. Creatinine trending upwards with diuresis. 2.12--> 2.28--> 2.44-->2.51--> 2.71--> today =2.44.  -- K  supplementation was discontinued with hyperkalemia on admission (5.4) Now at 4.8  NSVT- seen on telemetry last pm 21 beats and yesterday, fewer beats.  On Bystolic 30 mg daily in divided dose  -- K 5 mag 1.7 - now 4.5 K+  -- Continue to monitor  Deconditioning- PT evaluated and recommended home health PT  CAD- s/p stent to RCA 2003 and PTCA distal to previous stents in 2011  -- Low risk nuclear study in 2012  -- May need further ischemic work up with newly reduced EF to rule out ischemia as the cause. Would plan as outpt. LLE wound- seen by wound care nurse. Pt states he is followed by the outpatient wound care center and they have placed a skin substitute over a chronic full thickness wound and covered it with a compression wrap dressing. This type of topical treatment should be left undisturbed for 1 week. Patient is due to return to the outpatient wound care center on Wed for dressing change but since here,  WOC removed and assessed the site  Tobacco abuse: Still smokes  Chronic resp failure: on home O2 for severe COPD, recent acute exacerbation on prednisone.     LOS: 6 days   Time spent with pt. :15 minutes. [facility] R  Nurse Practitioner Certified Pager [phone] or after 5pm and on weekends call [phone] 10/03/2013, 8:12 AM   Patient seen and examined. Agree with assessment and plan. Feels better. Cr 2.44 today. Titrating hydralazine/nitrates; will increase to hydralazine to 25 mg tid. Still with significant pitting LE edema. Telemetry reveals NSWCT irregular ? Abberrancy. Wt 183 today down from 202 on admission (decrease 19 lbs). Will check CXR today, f/u BNP in am. If continues to improve ? Dc in 24 - 48 hrs.   Troy Sine, MD, [facility] 10/03/2013 8:24 AM

## 2013-10-03 NOTE — Progress Notes (Signed)
Patient places himself on CPAP.  

## 2013-10-04 DIAGNOSIS — N184 Chronic kidney disease, stage 4 (severe): Secondary | ICD-10-CM

## 2013-10-04 LAB — PRO B NATRIURETIC PEPTIDE: PRO B NATRI PEPTIDE: 17996 pg/mL — AB (ref 0–125)

## 2013-10-04 LAB — BASIC METABOLIC PANEL
Anion gap: 12 (ref 5–15)
BUN: 43 mg/dL — AB (ref 6–23)
CO2: 29 meq/L (ref 19–32)
Calcium: 8.5 mg/dL (ref 8.4–10.5)
Chloride: 101 mEq/L (ref 96–112)
Creatinine, Ser: 2.62 mg/dL — ABNORMAL HIGH (ref 0.50–1.35)
GFR calc Af Amer: 26 mL/min — ABNORMAL LOW (ref >90)
GFR calc non Af Amer: 23 mL/min — ABNORMAL LOW (ref >90)
Glucose, Bld: 95 mg/dL (ref 70–99)
Potassium: 4.3 mEq/L (ref 3.7–5.3)
SODIUM: 142 meq/L (ref 137–147)

## 2013-10-04 LAB — CBC
HEMATOCRIT: 32.2 % — AB (ref 39.0–52.0)
HEMOGLOBIN: 10.4 g/dL — AB (ref 13.0–17.0)
MCH: 29.2 pg (ref 26.0–34.0)
MCHC: 32.3 g/dL (ref 30.0–36.0)
MCV: 90.4 fL (ref 78.0–100.0)
Platelets: 106 10*3/uL — ABNORMAL LOW (ref 150–400)
RBC: 3.56 MIL/uL — ABNORMAL LOW (ref 4.22–5.81)
RDW: 14.6 % (ref 11.5–15.5)
WBC: 6.1 10*3/uL (ref 4.0–10.5)

## 2013-10-04 NOTE — Progress Notes (Signed)
Physical Therapy Treatment Patient Details Name: [name] MRN: [id number] DOB: 03/02/1940 Today's Date: 10/04/2013    History of Present Illness 73 y.o. male with h/o CAD, OSA on CPAP, COPD, admitted with a-fib, increased BLE edema, dizziness. Dx of acute diastolic heart failure. Pt has a cat bite wound on L calf which is being treated at outpt wound center.     PT Comments    Pt initially declined to participate but then agreeable.  O2 98% after gait on 2L/min.  Pt declined stairs, but discussed energy conservation techniques with him.  Follow Up Recommendations  Home health PT     Equipment Recommendations  None recommended by PT    Recommendations for Other Services       Precautions / Restrictions Precautions Precautions: Fall Precaution Comments: monitor O2 Restrictions Weight Bearing Restrictions: No    Mobility  Bed Mobility Overal bed mobility: Independent                Transfers Overall transfer level: Independent                  Ambulation/Gait Ambulation/Gait assistance: Supervision Ambulation Distance (Feet): 200 Feet Assistive device: None (pushing O2 tank) Gait Pattern/deviations: Step-through pattern Gait velocity: increased   General Gait Details: Amb with o2 at 2L/min.  Pt took 2 standing rest breaks. o2 98% after gait.   Stairs Stairs:  (refused)          Wheelchair Mobility    Modified Rankin (Stroke Patients Only)       Balance Overall balance assessment: No apparent balance deficits (not formally assessed)                                  Cognition Arousal/Alertness: Awake/alert Behavior During Therapy: WFL for tasks assessed/performed Overall Cognitive Status: Within Functional Limits for tasks assessed                      Exercises      General Comments        Pertinent Vitals/Pain Pain Assessment: No/denies pain    Home Living                      Prior  Function            PT Goals (current goals can now be found in the care plan section) Acute Rehab PT Goals Patient Stated Goal: to go home ASAP PT Goal Formulation: With patient Time For Goal Achievement: 10/14/13 Potential to Achieve Goals: Good Progress towards PT goals: Progressing toward goals    Frequency  Min 3X/week    PT Plan Current plan remains appropriate    Co-evaluation             End of Session Equipment Utilized During Treatment: Oxygen;Gait belt Activity Tolerance: Patient tolerated treatment well;Treatment limited secondary to agitation Patient left: in bed;with family/visitor present;with call bell/phone within reach (sitting EOB)     Time: 2423-5361 PT Time Calculation (min): 15 min  Charges:  $Gait Training: 8-22 mins                    G Codes:      Najeh Credit LUBECK 10/04/2013, 11:20 AM

## 2013-10-04 NOTE — Progress Notes (Signed)
Subjective:  No chest pain; no resting dyspnea  Objective:   Vital Signs in the last 24 hours: Temp:  [97.3 F (36.3 C)-98.3 F (36.8 C)] 97.6 F (36.4 C) (09/04 0602) Pulse Rate:  [74-90] 87 (09/04 0820) Resp:  [16-18] 18 (09/04 0820) BP: (113-139)/(68-83) 120/69 mmHg (09/04 0602) SpO2:  [95 %-97 %] 97 % (09/04 0820) Weight:  [180 lb 14.4 oz (82.056 kg)] 180 lb 14.4 oz (82.056 kg) (09/04 0602)  Intake/Output from previous day: 09/03 0701 - 09/04 0700 In: 1680 [P.O.:1680] Out: 775 [Urine:775]  Medications: . antiseptic oral rinse  7 mL Mouth Rinse BID  . apixaban  5 mg Oral BID  . aspirin EC  81 mg Oral Daily  . atorvastatin  40 mg Oral QHS  . budesonide-formoterol  2 puff Inhalation BID  . diltiazem  180 mg Oral Daily  . furosemide  40 mg Oral Daily  . hydrALAZINE  25 mg Oral 3 times per day  . isosorbide mononitrate  45 mg Oral Daily  . nebivolol  10 mg Oral q1800  . nebivolol  20 mg Oral Daily  . pantoprazole  40 mg Oral Daily  . sodium chloride  3 mL Intravenous Q12H  . tiotropium  18 mcg Inhalation Daily         Physical Exam:   General appearance: alert, cooperative and no distress Neck: no adenopathy, no JVD, supple, symmetrical, trachea midline and thyroid not enlarged, symmetric, no tenderness/mass/nodules Lungs: decreased BS R>L Heart: irregularly irregular rhythm Abdomen: soft, non-tender; bowel sounds normal; no masses,  no organomegaly Extremities: Edema improving; still 1-2+ R LLE; left leg bandaged Neurologic: Grossly normal   Rate: 90  Rhythm: atrial fibrillation  Lab Results:   Recent Labs  10/03/13 0442 10/04/13 0336  NA 141 142  K 4.5 4.3  CL 101 101  CO2 32 29  GLUCOSE 88 95  BUN 41* 43*  CREATININE 2.44* 2.62*    No results found for this basename: TROPONINI, CK, MB,  in the last 72 hours  Hepatic Function Panel No results found for this basename: PROT, ALBUMIN, AST, ALT, ALKPHOS, BILITOT, BILIDIR, IBILI,  in the last 72  hours No results found for this basename: INR,  in the last 72 hours BNP (last 3 results)  Recent Labs  10/01/13 0458 10/02/13 0630 10/04/13 0336  PROBNP 18365.0* 17439.0* 17996.0*    Lipid Panel  No results found for this basename: chol, trig, hdl, cholhdl, vldl, ldlcalc      Imaging:  Dg Chest 2 View  10/03/2013   CLINICAL DATA:  Congestive heart failure, atrial fibrillation  EXAM: CHEST  2 VIEW  COMPARISON:  09/28/2013  FINDINGS: Borderline cardiomegaly. No acute infiltrate or pleural effusion. No pulmonary edema. Degenerative changes thoracic spine. Mild hyperinflation.  IMPRESSION: Mild hyperinflation. No acute infiltrate or convincing pulmonary edema   Electronically Signed   By: Lahoma Crocker M.D.   On: 10/03/2013 09:43      Assessment/Plan:   Active Problems:   HYPERTENSION   COPD (chronic obstructive pulmonary disease)   Sleep apnea, obstructive   Atrial fibrillation with RVR   Chronic anticoagulation - Eliquis   Chronic renal insufficiency, stage III (moderate)   Chronic atrial fibrillation/permanent   Smoking   Acute diastolic heart failure,    Atrial fibrillation with rapid ventricular response   Chronic respiratory failure   73 yo male hx of CAD s/p stent to RCA 2003 and PTCA distal to previous stents in 2011, COPD on  PRN home O2, HTN, HLD, OSA, PAF admitted on 09/27/13 with afib with RVR and acute CHF.  Chronic atrial fibrillation, admitted with RVR. Currently well controlled on Cardizem 180mg   -- Failed prior rhythm control with Multaq, DCCVs; not a candidate for amiodarone with severe COPD -- Strategy is for rate control (on diltiazem, nebivolol), anticoag with Eliquis. Changed to long acting diltiazem on 09/29/13 (cardizem 120mg  qd) and increased to 180 mg for improved rate control.  -- Currently on aspirin and Eliquis due to concomitant CAD.   Acute on chronic systolic CHF- BNP today still elevated at 17,996. Diuretic dose had been reduced due to worsening  renal function -- 2D ECHO: 09/30/2013; EF 45-50%: Mild concentric hypertrophy. No RWMAs. Compared to the prior study from 7/67/3419 the LV systolic function is now mildly impaired LVEF 45-50%, previously 60-65%. Right sided chanbers are dilated with mod dec RV function. Mod pulmonary HTN.  -- Last echo 05/2012 EF 55-60%, could not assess diastolic function due to afib  -- Currently on po Lasix 40mg  daily. Creatinine was trending upwards with diuresis.  - Weight 180 today, down from 202 on admission; 22 lb weight reduction.  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 Creatinine trended upwards with diuresis. 2.12--> 2.28--> 2.44-->2.51--> 2.71--> 2.44 yesterday but increased to 2.62 today.  NSVT- seen on telemetry last pm 21 beats several days ago. On Bystolic 30 mg daily in divided dose  -- K 4.3 today; Mg was 1.7 -- Continue to monitor   Deconditioning- PT evaluated and recommended home health PT  CAD- s/p stent to RCA 2003 and PTCA distal to previous stents in 2011  -- Low risk nuclear study in 2012  -- May need further ischemic work up with newly reduced EF to rule out ischemia as the cause. Would plan as outpt.   LLE wound- seen by wound care nurse. Pt states he is followed by the outpatient wound care center and they have placed a skin substitute over a chronic full thickness wound and covered it with a compression wrap dressing. This type of topical treatment should be left undisturbed for 1 week. Patient is due to return to the outpatient wound care center on Wed for dressing change but since here, WOC removed and assessed the site   Tobacco abuse: Still smokes ; again discussed absolute smoking cessation  Chronic resp failure: on home O2 for severe COPD, recent acute exacerbation on prednisone.   If renal function remains stable, possible dc tomorrow with f/u renal fxn lab next week    Troy Sine, MD,  [facility] 10/04/2013, 9:12 AM

## 2013-10-04 NOTE — Progress Notes (Signed)
Patient places himself on CPAP before bed.

## 2013-10-05 ENCOUNTER — Other Ambulatory Visit: Payer: Self-pay | Admitting: Physician Assistant

## 2013-10-05 DIAGNOSIS — I2510 Atherosclerotic heart disease of native coronary artery without angina pectoris: Secondary | ICD-10-CM

## 2013-10-05 DIAGNOSIS — N184 Chronic kidney disease, stage 4 (severe): Secondary | ICD-10-CM

## 2013-10-05 DIAGNOSIS — I5043 Acute on chronic combined systolic (congestive) and diastolic (congestive) heart failure: Secondary | ICD-10-CM

## 2013-10-05 LAB — BASIC METABOLIC PANEL
Anion gap: 11 (ref 5–15)
BUN: 48 mg/dL — AB (ref 6–23)
CHLORIDE: 100 meq/L (ref 96–112)
CO2: 28 mEq/L (ref 19–32)
CREATININE: 2.43 mg/dL — AB (ref 0.50–1.35)
Calcium: 8.7 mg/dL (ref 8.4–10.5)
GFR calc Af Amer: 29 mL/min — ABNORMAL LOW (ref >90)
GFR calc non Af Amer: 25 mL/min — ABNORMAL LOW (ref >90)
Glucose, Bld: 93 mg/dL (ref 70–99)
POTASSIUM: 5 meq/L (ref 3.7–5.3)
Sodium: 139 mEq/L (ref 137–147)

## 2013-10-05 MED ORDER — DILTIAZEM HCL ER COATED BEADS 180 MG PO CP24: 180.0000 mg | ORAL_CAPSULE | Freq: Every day | ORAL | Status: DC

## 2013-10-05 MED ORDER — HYDRALAZINE HCL 25 MG PO TABS: 25.0000 mg | ORAL_TABLET | Freq: Three times a day (TID) | ORAL | Status: AC

## 2013-10-05 MED ORDER — ISOSORBIDE MONONITRATE 15 MG HALF TABLET: 45.0000 mg | ORAL_TABLET | Freq: Every day | ORAL | Status: DC

## 2013-10-05 NOTE — Discharge Summary (Signed)
Discharge Summary   Patient ID: [name] MRN: [id number], DOB/AGE: July 21, 1940 73 y.o. Admit date: 09/27/2013 D/C date:     10/05/2013  Primary Cardiologist: Dr. 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 Discharge weight 182 lbs.  HPI: 73 yo male hx of CAD s/p stent to RCA 2003 and PTCA distal to previous stents in 2011, COPD on PRN home O2, HTN, HLD, OSA, and PAF who was admitted on 09/27/13 with afib with RVR and acute CHF.   Hospital Course  Chronic atrial fibrillation, admitted with RVR. Currently well controlled on Cardizem 180mg   -- Failed prior rhythm control with Multaq, DCCVs; not a candidate for amiodarone with severe COPD  -- Strategy is for rate control (on diltiazem, nebivolol), anticoag with Eliquis. Changed to long acting diltiazem on 09/29/13 (cardizem 120mg  qd) and increased to 180 mg for improved rate control.  -- Currently on aspirin and Eliquis due to concomitant CAD.   Acute on chronic systolic CHF- BNP remained quite elevated 18.3K--> 17.4K --> 17.9K despite diuresis. Diuresis had to be discontinued a multiple occasions due to worsening renal function. -- 2D ECHO: 09/30/2013; EF 45-50%: Mild concentric hypertrophy. No RWMAs. Compared to the prior study from 9/62/2297 the LV systolic function is now mildly impaired LVEF 45-50%, previously 60-65%. Right sided chanbers are dilated with mod dec RV function. Mod pulmonary HTN.  -- Last echo  05/2012 EF 55-60%, could not assess diastolic function due to afib  -- Currently on po Lasix 40mg  daily. -- Weight 182 today, down from 202 on admission; 20 lb weight reduction. (I/0s not accurate to report) -- Discharge on PO Lasix 40mg  qd. No ACE due to renal insufficiency but hydralazines + nitrates added. Continue BB.  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 Creatinine trended upwards with diuresis. 2.12--> 2.28--> 2.44-->2.51--> 2.71--> 2.44 --> 2.62--> 2.43 today.  -- Felt stable for discharge with BMET next week.  NSVT- seen on telemetry: 21 beats several days ago. On Bystolic 30 mg daily in divided dose. 20mg  at 20am and 10 mg at 6pm. -- K and mag normal  Deconditioning- PT evaluated and recommended home health PT   CAD- s/p stent to RCA 2003 and PTCA distal to previous stents in 2011  -- Low risk nuclear study in 2012  -- May need further ischemic work up with newly reduced EF to rule out ischemia as the cause. This is planned to be done as an outpatient.  LLE wound- seen by wound care nurse. Pt states he is followed by the outpatient wound care center and they have placed a skin substitute over a chronic full thickness wound and covered it with a compression wrap dressing. This type of topical treatment should be left undisturbed for 1 week. Patient is due to return to the outpatient wound care center on Wed for dressing change but since here, WOC removed and assessed  the site.  Tobacco abuse: Still smokes ; again discussed absolute smoking cessation.  Chronic resp failure: on home O2 for severe COPD, recent acute exacerbation was on prednisone ( has not been on prednisone while admitted.)   The patient has had an uncomplicated hospital course and is recovering well. He has been seen by Dr. Ron Parker today and deemed ready for discharge home. All follow-up appointments have been scheduled (electronic messages sent to  appointment coordinator as it is the weekend). Smoking cessation was disscussed in length. Discharge medications are listed below.    Discharge Vitals: Blood pressure 127/69, pulse 81, temperature 97.7 F (36.5 C), temperature source Oral, resp. rate 18, height 5\' 11"  (1.803 m), weight 182 lb 9.6 oz (82.827 kg), SpO2 94.00%.  Labs: Lab Results  Component Value Date   WBC 6.1 10/04/2013   HGB 10.4* 10/04/2013   HCT 32.2* 10/04/2013   MCV 90.4 10/04/2013   PLT 106* 10/04/2013     Recent Labs Lab 10/05/13 1142  NA 139  K 5.0  CL 100  CO2 28  BUN 48*  CREATININE 2.43*  CALCIUM 8.7  GLUCOSE 93     Diagnostic Studies/Procedures   Dg Chest 2 View  10/03/2013   CLINICAL DATA:  Congestive heart failure, atrial fibrillation  EXAM: CHEST  2 VIEW  COMPARISON:  09/28/2013  FINDINGS: Borderline cardiomegaly. No acute infiltrate or pleural effusion. No pulmonary edema. Degenerative changes thoracic spine. Mild hyperinflation.  IMPRESSION: Mild hyperinflation. No acute infiltrate or convincing pulmonary edema   Electronically Signed   By: Lahoma Crocker M.D.   On: 10/03/2013 09:43   Dg Chest 2 View  09/28/2013   CLINICAL DATA:  74 year old male with shortness of breath.  EXAM: CHEST  2 VIEW  COMPARISON:  08/16/2012 and prior chest radiographs dating back to 10/21/2009.  FINDINGS: Cardiomegaly identified.  Mild interstitial opacities are compatible with interstitial edema.  A small small scratch a small bilateral pleural effusions are noted.  Mild bibasilar atelectasis is present.  There is no evidence of pneumothorax.  No acute bony abnormalities are identified.  IMPRESSION: Cardiomegaly with mild interstitial pulmonary edema and small bilateral pleural effusions.   Electronically Signed   By: Hassan Rowan M.D.   On: 09/28/2013 18:24    Discharge Medications     Medication List    STOP taking these medications       digoxin 0.125 MG tablet  Commonly known as:  LANOXIN     hydrochlorothiazide 12.5 MG  capsule  Commonly known as:  MICROZIDE     predniSONE 10 MG tablet  Commonly known as:  DELTASONE      TAKE these medications       aspirin 81 MG tablet  Take 81 mg by mouth daily.     atorvastatin 40 MG tablet  Commonly known as:  LIPITOR  Take 40 mg by mouth at bedtime.     diltiazem 180 MG 24 hr capsule  Commonly known as:  CARDIZEM CD  Take 1 capsule (180 mg total) by mouth daily.     ELIQUIS 5 MG Tabs tablet  Generic drug:  apixaban  Take 5 mg by mouth 2 (two) times daily.     furosemide 40 MG tablet  Commonly known as:  LASIX  Take 40 mg by mouth daily.     hydrALAZINE 25 MG tablet  Commonly known as:  APRESOLINE  Take 1 tablet (25 mg total) by mouth every 8 (eight) hours.     isosorbide  mononitrate 15 mg Tb24 24 hr tablet  Commonly known as:  IMDUR  Take 1.5 tablets (45 mg total) by mouth daily.     multivitamin with minerals tablet  Take 1 tablet by mouth daily.     nebivolol 10 MG tablet  Commonly known as:  BYSTOLIC  Take 01-77 mg by mouth 2 (two) times daily. Take 2 tablets (20 mg) every morning and 1 tablet (10 mg) every night     pantoprazole 40 MG tablet  Commonly known as:  PROTONIX  Take 40 mg by mouth daily.     tiotropium 18 MCG inhalation capsule  Commonly known as:  SPIRIVA  Place 18 mcg into inhaler and inhale 2 (two) times daily.        Disposition   The patient will be discharged in stable condition to home.  Follow-up Information   Follow up with [facility]. (RN/PT)    Contact information:   [address] [place] [address] [phone]       Follow up with [facility] On 10/08/2013. (Please get lab work on Tuesday between 8am and 4pm to re check your kidney function)    Specialty:  Cardiology   Contact information:   [address] [place] [address] [phone]      Follow up with Troy Sine, MD. (The office will call to make an appointment with you for a stress  test and a follow up appointment. Please call them if you do not hear from them by next wednesday.)    Specialty:  Cardiology   Contact information:   [address] [place] [address] [phone]         Duration of Discharge Encounter: Greater than 30 minutes including physician and PA time.  Mable Fill R PA-C 10/05/2013, 3:14 PM Patient seen and examined. I agree with the assessment and plan as detailed above. See also my additional thoughts below.   There is a progress note from me today. I made the decision that the patient could go home. I agree with all of the plans as outlined above.  Dola Argyle, MD, [facility] 10/07/2013 8:41 AM

## 2013-10-05 NOTE — Progress Notes (Signed)
The patient stated that he would only like his IV changed if he was not discharged today.

## 2013-10-05 NOTE — Progress Notes (Addendum)
Patient ID: [name], male   DOB: 01/23/1941, 73 y.o.   MRN: [id number]    SUBJECTIVE:  The patient is feeling better today. He hopes to go home. Unfortunately there is no chemistry lab yet. I am ordering a stat. I will then return to see if he can go home.   Filed Vitals:   10/04/13 2134 10/05/13 0300 10/05/13 0608 10/05/13 0931  BP:  116/54 142/74   Pulse:  81    Temp:  97.7 F (36.5 C)    TempSrc:  Oral    Resp:  18    Height:      Weight:  182 lb 9.6 oz (82.827 kg)    SpO2: 100% 100%  94%     Intake/Output Summary (Last 24 hours) at 10/05/13 1135 Last data filed at 10/05/13 0837  Gross per 24 hour  Intake   1320 ml  Output    300 ml  Net   1020 ml    LABS: Basic Metabolic Panel:  Recent Labs  10/03/13 0442 10/04/13 0336  NA 141 142  K 4.5 4.3  CL 101 101  CO2 32 29  GLUCOSE 88 95  BUN 41* 43*  CREATININE 2.44* 2.62*  CALCIUM 8.5 8.5   Liver Function Tests: No results found for this basename: AST, ALT, ALKPHOS, BILITOT, PROT, ALBUMIN,  in the last 72 hours No results found for this basename: LIPASE, AMYLASE,  in the last 72 hours CBC:  Recent Labs  10/04/13 0336  WBC 6.1  HGB 10.4*  HCT 32.2*  MCV 90.4  PLT 106*   Cardiac Enzymes: No results found for this basename: CKTOTAL, CKMB, CKMBINDEX, TROPONINI,  in the last 72 hours BNP: No components found with this basename: POCBNP,  D-Dimer: No results found for this basename: DDIMER,  in the last 72 hours Hemoglobin A1C: No results found for this basename: HGBA1C,  in the last 72 hours Fasting Lipid Panel: No results found for this basename: CHOL, HDL, LDLCALC, TRIG, CHOLHDL, LDLDIRECT,  in the last 72 hours Thyroid Function Tests: No results found for this basename: TSH, T4TOTAL, FREET3, T3FREE, THYROIDAB,  in the last 72 hours  RADIOLOGY: Dg Chest 2 View  10/03/2013   CLINICAL DATA:  Congestive heart failure, atrial fibrillation  EXAM: CHEST  2 VIEW  COMPARISON:  09/28/2013  FINDINGS:  Borderline cardiomegaly. No acute infiltrate or pleural effusion. No pulmonary edema. Degenerative changes thoracic spine. Mild hyperinflation.  IMPRESSION: Mild hyperinflation. No acute infiltrate or convincing pulmonary edema   Electronically Signed   By: Lahoma Crocker M.D.   On: 10/03/2013 09:43   Dg Chest 2 View  09/28/2013   CLINICAL DATA:  73 year old male with shortness of breath.  EXAM: CHEST  2 VIEW  COMPARISON:  08/16/2012 and prior chest radiographs dating back to 10/21/2009.  FINDINGS: Cardiomegaly identified.  Mild interstitial opacities are compatible with interstitial edema.  A small small scratch a small bilateral pleural effusions are noted.  Mild bibasilar atelectasis is present.  There is no evidence of pneumothorax.  No acute bony abnormalities are identified.  IMPRESSION: Cardiomegaly with mild interstitial pulmonary edema and small bilateral pleural effusions.   Electronically Signed   By: Hassan Rowan M.D.   On: 09/28/2013 18:24    PHYSICAL EXAM he is oriented to person time and place. Affect is normal. Family is in the room. Lungs reveal scattered rhonchi. Cardiac exam reveals S1 and S2. There is no significant peripheral edema.   TELEMETRY: I personally  reviewed telemetry today October 05, 2013. There is atrial fibrillation. The rate is controlled.   ASSESSMENT AND PLAN:    HYPERTENSION   COPD (chronic obstructive pulmonary disease)   Sleep apnea, obstructive    Atrial fibrillation with RVR    Rate is controlled.    Chronic anticoagulation - Eliquis   Chronic renal insufficiency, stage III (moderate)   Chronic atrial fibrillation/permanent   Smoking    Acute diastolic heart failure,      Clinically the patient is improved. I have ordered a stat chemistry lab. Nurses are aware. As soon as we have these results we will decide if it is safe for him to go home.   12:41PM....... lab is now back. Creatinine is gone down from 2.6-2.4. Patient can be discharged home today.     Atrial fibrillation with rapid ventricular response   Chronic respiratory failure  Dola Argyle 10/05/2013 11:35 AM

## 2013-10-09 ENCOUNTER — Telehealth: Payer: Self-pay | Admitting: Cardiovascular Disease

## 2013-10-09 NOTE — Telephone Encounter (Signed)
Returned call to [employer] with [facility] no answer.[place].

## 2013-10-09 NOTE — Telephone Encounter (Signed)
This encounter has been closed

## 2013-10-10 ENCOUNTER — Telehealth: Payer: Self-pay | Admitting: Cardiovascular Disease

## 2013-10-10 NOTE — Telephone Encounter (Signed)
Wrong office

## 2013-10-10 NOTE — Telephone Encounter (Signed)
Dr. Claiborne Billings pt. Needs verbal order for home health two times a week for 4 weeks

## 2013-10-10 NOTE — Telephone Encounter (Signed)
Informed [name] that D. Claiborne Billings would be here on Monday and i could talk to him then unless he answers my EPIC request

## 2013-10-10 NOTE — Telephone Encounter (Signed)
Need a verbal order for Home Health physical therapy. She will see 2 times week for 4 weeks.

## 2013-10-11 NOTE — Telephone Encounter (Signed)
Verbal order given for PT 2 times per week x 4 weeks.

## 2013-10-11 NOTE — Telephone Encounter (Signed)
Returning your call. °

## 2013-10-14 IMAGING — CR DG CHEST 1V PORT
2 series · 2 of 2 positions shown · non-contrast
Comparison: Prior chest x-ray 07/28/2012

CLINICAL DATA: Respiratory failure, pulmonary infiltrates

PORTABLE CHEST - 1 VIEW

[AP (1 of 2)]
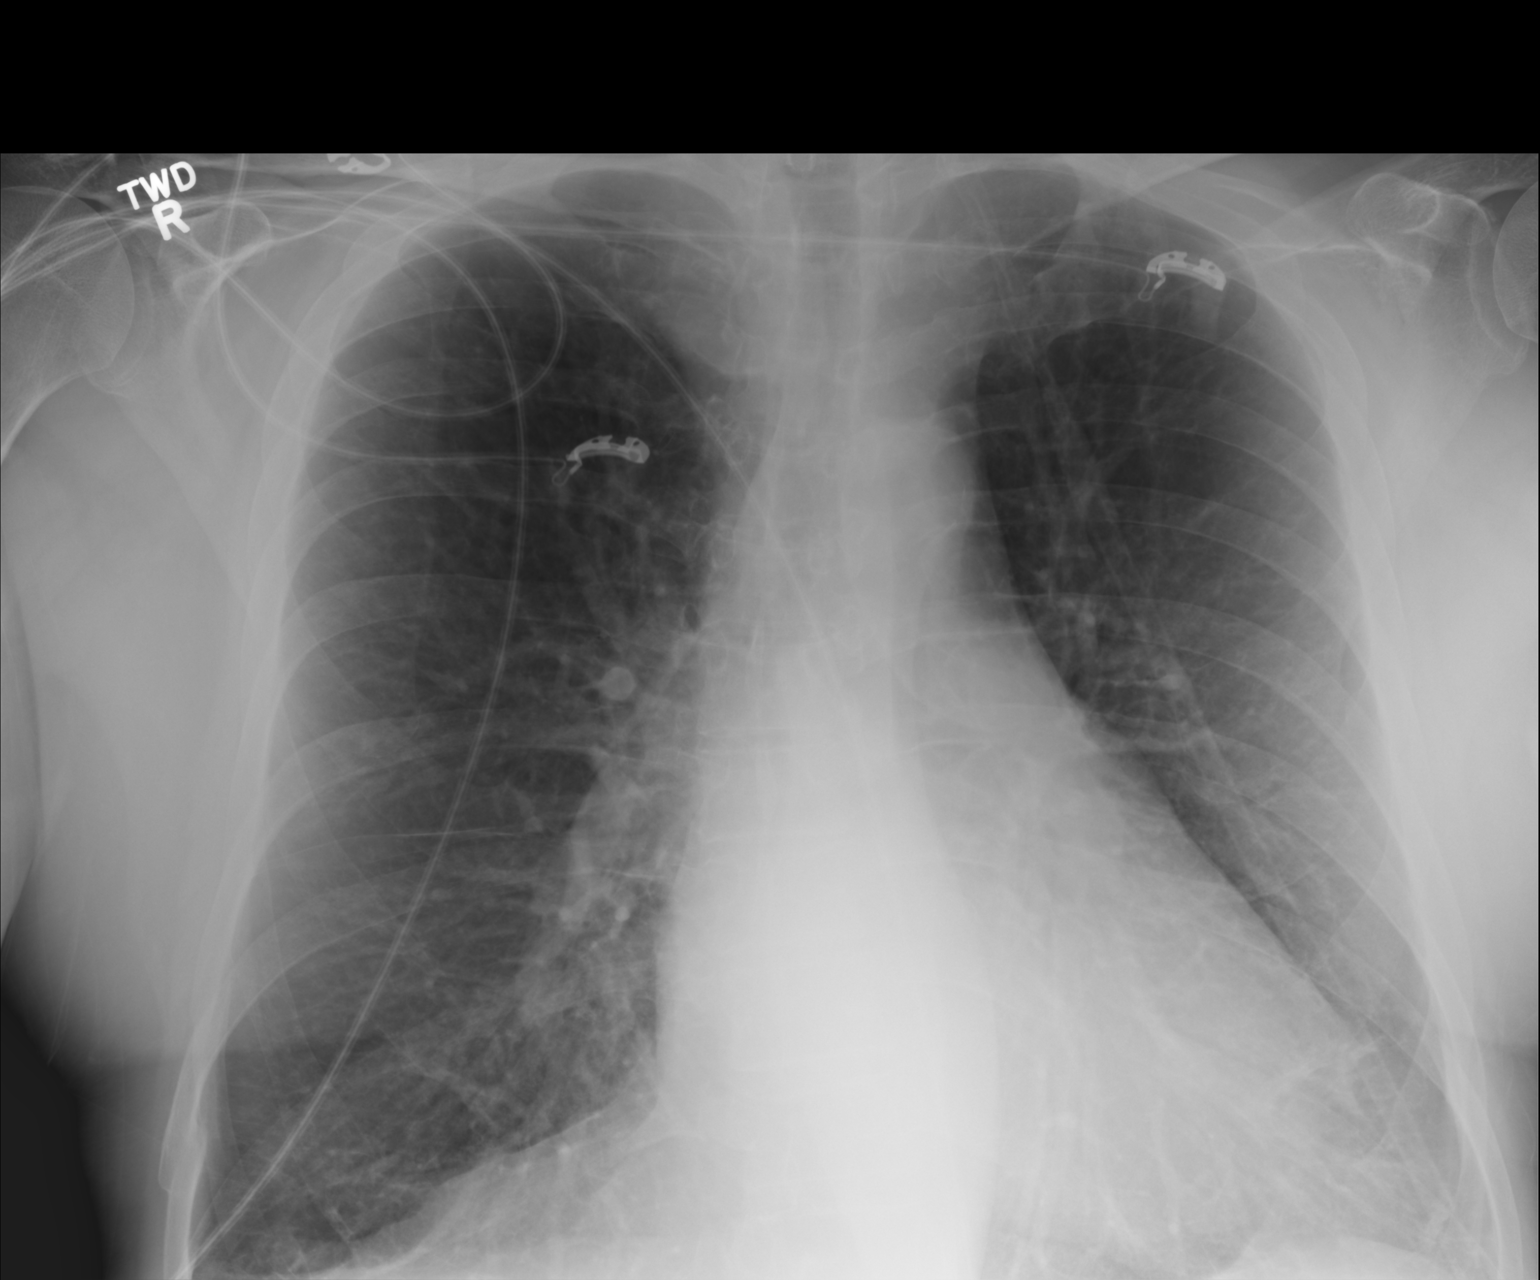

[AP (2 of 2)]
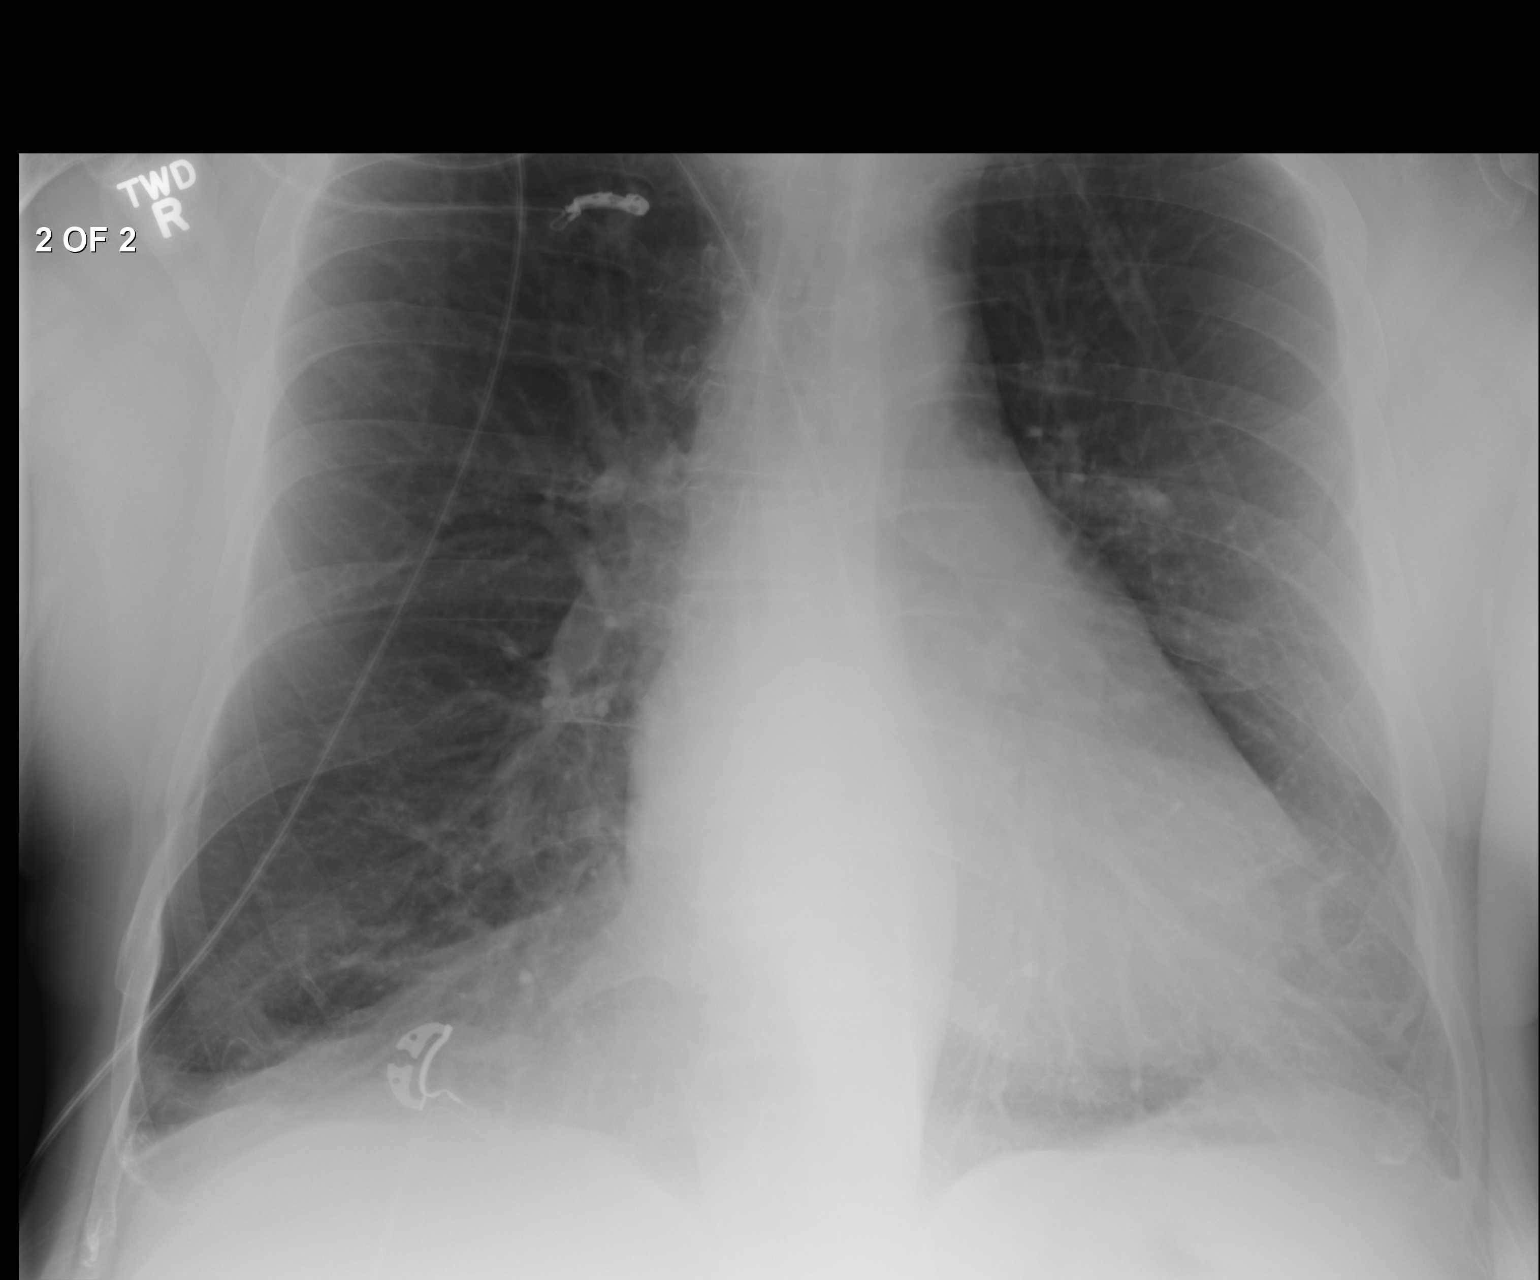

[2 of 2 positions shown; findings below may reference images not displayed]

FINDINGS: No support apparatus identified.  Improving interstitial
opacities in the bilateral lung bases.  Stable mild cardiomegaly.
No pneumothorax or large effusion.  No acute osseous abnormality.
IMPRESSION: Improving/resolving interstitial opacities the bilateral lung bases
consistent with resolving edema or pneumonia.

## 2013-10-14 NOTE — Telephone Encounter (Signed)
ok 

## 2013-10-16 DIAGNOSIS — I872 Venous insufficiency (chronic) (peripheral): Secondary | ICD-10-CM | POA: Diagnosis not present

## 2013-10-16 DIAGNOSIS — L97909 Non-pressure chronic ulcer of unspecified part of unspecified lower leg with unspecified severity: Secondary | ICD-10-CM | POA: Diagnosis not present

## 2013-10-17 ENCOUNTER — Telehealth (HOSPITAL_COMMUNITY): Payer: Self-pay

## 2013-10-17 NOTE — Telephone Encounter (Signed)
Encounter complete. 

## 2013-10-22 ENCOUNTER — Encounter (HOSPITAL_COMMUNITY): Payer: Self-pay | Admitting: *Deleted

## 2013-10-22 ENCOUNTER — Ambulatory Visit (INDEPENDENT_AMBULATORY_CARE_PROVIDER_SITE_OTHER): Payer: Medicare Other | Admitting: Cardiology

## 2013-10-22 ENCOUNTER — Ambulatory Visit (HOSPITAL_COMMUNITY)
Admission: RE | Admit: 2013-10-22 | Discharge: 2013-10-22 | Disposition: A | Discharge status: Home / Self Care | Payer: Medicare Other | Source: Ambulatory Visit | Attending: Cardiovascular Disease | Admitting: Cardiovascular Disease

## 2013-10-22 ENCOUNTER — Telehealth: Payer: Self-pay | Admitting: *Deleted

## 2013-10-22 ENCOUNTER — Encounter: Payer: Self-pay | Admitting: Cardiology

## 2013-10-22 VITALS — BP 130/78 | HR 108 | Ht 71.0 in | Wt 190.0 lb

## 2013-10-22 DIAGNOSIS — I2510 Atherosclerotic heart disease of native coronary artery without angina pectoris: Secondary | ICD-10-CM

## 2013-10-22 DIAGNOSIS — N183 Chronic kidney disease, stage 3 unspecified: Secondary | ICD-10-CM

## 2013-10-22 DIAGNOSIS — I5043 Acute on chronic combined systolic (congestive) and diastolic (congestive) heart failure: Secondary | ICD-10-CM

## 2013-10-22 DIAGNOSIS — I4891 Unspecified atrial fibrillation: Secondary | ICD-10-CM

## 2013-10-22 DIAGNOSIS — I509 Heart failure, unspecified: Secondary | ICD-10-CM

## 2013-10-22 DIAGNOSIS — R609 Edema, unspecified: Secondary | ICD-10-CM

## 2013-10-22 DIAGNOSIS — I4821 Permanent atrial fibrillation: Secondary | ICD-10-CM

## 2013-10-22 DIAGNOSIS — I10 Essential (primary) hypertension: Secondary | ICD-10-CM

## 2013-10-22 DIAGNOSIS — Z7901 Long term (current) use of anticoagulants: Secondary | ICD-10-CM

## 2013-10-22 NOTE — Assessment & Plan Note (Signed)
Severe COPD on chronic O2

## 2013-10-22 NOTE — Assessment & Plan Note (Signed)
Controlled.  

## 2013-10-22 NOTE — Assessment & Plan Note (Addendum)
He is on 5 mg BID

## 2013-10-22 NOTE — Assessment & Plan Note (Signed)
GFR is 29

## 2013-10-22 NOTE — Assessment & Plan Note (Signed)
Recent admission with CHF and AF with RVR.  ECHO: 09/30/2013; LVEF 45-50%, previously 60-65% April 2014. Right sided chanbers are dilated with mod dec RV function. Mod pulmonary HTN

## 2013-10-22 NOTE — Assessment & Plan Note (Signed)
Failed DCCV on Multaq 2014. Rate controlled today.

## 2013-10-22 NOTE — Telephone Encounter (Signed)
Called and left message per Lurena Joiner not to cut the Eliquis into 1/2. Continue with 5 mg twice daily. Call me back if questions.

## 2013-10-22 NOTE — Assessment & Plan Note (Signed)
Chronic. 

## 2013-10-22 NOTE — Assessment & Plan Note (Signed)
RCA stent '03. ISR and new distal site PTCA in 2011. Low risk Myoview 2012. No angina

## 2013-10-22 NOTE — Assessment & Plan Note (Signed)
From a cat bite in February 2015. He is followed at the wound center.

## 2013-10-22 NOTE — Progress Notes (Signed)
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 Glade Lloyd. Repeat catheterization in 2011 showed total occlusion of the RCA within the previously stented segment. Dr Claiborne Billings successfully reopened this vessel and also performed PTCA distal to his previously placed stents. He did have concomitant CAD of the LAD and circumflex vessels. 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 He was found to be in atrial fibrillation in March 2014 and at that time was started on Eliquis anticoagulation and rate control medications. He failed DCCV and Multaq and is in chronic atrial fibrillation. He suffered a cat bite in Jan 2015 and has problems with a chronic leg wound.            He was admitted 09/27/13 with acute on chronic mixed CHF and rapid AF. He was sent from the wound center. His echo showed a slight drop in his EF to 45%.  He is seen in the office today for post hospital follow up. He has done well since discharge. He was apparently set up for a Myoview today as well but ate breakfast this am. I believe this was set up at discharge because of new drop in EF. The pt denies any angina, there were no WMA noted on echo.      Current Outpatient Prescriptions  Medication Sig Dispense Refill  . apixaban (ELIQUIS) 5 MG TABS tablet Take 5 mg by mouth 2 (two) times daily.      Marland Kitchen aspirin 81 MG tablet Take 81 mg by mouth daily.        Marland Kitchen atorvastatin (LIPITOR) 40 MG tablet Take 40 mg by mouth at bedtime.       Marland Kitchen diltiazem (CARDIZEM CD) 180 MG 24 hr capsule Take 1 capsule (180 mg total) by mouth daily.  30 capsule  3  . furosemide (LASIX) 40 MG tablet Take 40 mg by mouth daily.      . hydrALAZINE (APRESOLINE) 25  MG tablet Take 1 tablet (25 mg total) by mouth every 8 (eight) hours.  90 tablet  11  . isosorbide mononitrate (IMDUR) 30 MG 24 hr tablet Take 30 mg by mouth. Take 1/2 tablet daily      . Multiple Vitamins-Minerals (MULTIVITAMIN WITH MINERALS) tablet Take 1 tablet by mouth daily.        . nebivolol (BYSTOLIC) 10 MG tablet Take 10 mg by mouth. Take 2 tablets (20 mg) every morning and 1 tablet (10 mg) every night      . pantoprazole (PROTONIX) 40 MG tablet Take 40 mg by mouth daily.       Marland Kitchen tiotropium (SPIRIVA) 18 MCG inhalation capsule Place 18 mcg into inhaler and inhale 2 (two) times daily.        No current facility-administered medications for this visit.    Allergies  Allergen Reactions  . Ranitidine Hcl      throat swelling, difficulty breathing  . Beta Adrenergic Blockers Other (See Comments)    Caused sexual dysfunction  . Ramipril Swelling     throat swelling  . Sulfonamide Derivatives Other (See Comments)     childhood reation of hematuria  . Ticlopidine Hcl Swelling    Leg swelling    History   Social History  .  Marital Status: Married    Spouse Name: N/A    Number of Children: 2  . Years of Education: N/A   Occupational History  . retired Designer, industrial/product   Social History Main Topics  . Smoking status: Current Every Day Smoker -- 1.00 packs/day for 55 years    Types: Cigarettes  . Smokeless tobacco: Never Used     Comment: started at age 34.  prev was 2 ppd.   . Alcohol Use: 1.8 oz/week    3 Shots of liquor per week  . Drug Use: No  . Sexual Activity: Not on file   Other Topics Concern  . Not on file   Social History Narrative  . No narrative on file     Review of Systems: General: negative for chills, fever, night sweats or weight changes.  Cardiovascular: negative for chest pain, dyspnea on exertion, edema, orthopnea, palpitations, paroxysmal nocturnal dyspnea or shortness of breath Dermatological: negative for rash Respiratory:  negative for cough or wheezing Urologic: negative for hematuria Abdominal: negative for nausea, vomiting, diarrhea, bright red blood per rectum, melena, or hematemesis Neurologic: negative for visual changes, syncope, or dizziness All other systems reviewed and are otherwise negative except as noted above.    Blood pressure 130/78, pulse 108, height 5\' 11"  (1.803 m), weight 190 lb (86.183 kg).  General appearance: alert, cooperative and no distress Neck: no carotid bruit and no JVD Lungs: decreased breath sounds c/w COPD Heart: irregularly irregular rhythm Extremities: 2+ RLE edema, he has a cast on his LLE  EKG-AF with RBBB   ASSESSMENT AND PLAN:   Acute on chronic combined systolic and diastolic congestive heart failure Recent admission with CHF and AF with RVR.  ECHO: 09/30/2013; LVEF 45-50%, previously 60-65% April 2014. Right sided chanbers are dilated with mod dec RV function. Mod pulmonary HTN  Permanent atrial fibrillation Failed DCCV on Multaq 2014. Rate controlled today.  CAD- RCA PCI '03 RCA stent '03. ISR and new distal site PTCA in 2011. Low risk Myoview 2012. No angina  Chronic anticoagulation - Eliquis He is on 5 mg BID  COPD (chronic obstructive pulmonary disease) Severe COPD on chronic O2  Chronic renal insufficiency, stage IV (severe) GFR is 29  HYPERTENSION Controlled  Peripheral edema Chronic  Necrotic wound of left leg From a cat bite in February 2015. He is followed at the wound center.    PLAN  I don't think Mr Choquette needs to have a Myoview at this time. He will see Dr Claiborne Billings in 3 months.   Keionna Kinnaird KPA-C 10/22/2013 10:49 AM

## 2013-10-22 NOTE — Patient Instructions (Addendum)
Your physician recommends that you schedule a follow-up appointment in: 2 months with Dr. Claiborne Billings.

## 2013-10-23 DIAGNOSIS — L97909 Non-pressure chronic ulcer of unspecified part of unspecified lower leg with unspecified severity: Secondary | ICD-10-CM | POA: Diagnosis not present

## 2013-10-23 DIAGNOSIS — I872 Venous insufficiency (chronic) (peripheral): Secondary | ICD-10-CM | POA: Diagnosis not present

## 2013-10-25 ENCOUNTER — Ambulatory Visit: Payer: Medicare Other | Admitting: Pulmonary Disease

## 2013-10-30 ENCOUNTER — Telehealth (HOSPITAL_COMMUNITY): Payer: Self-pay

## 2013-10-30 NOTE — Telephone Encounter (Signed)
Encounter complete. 

## 2013-11-01 ENCOUNTER — Telehealth: Payer: Self-pay | Admitting: Cardiovascular Disease

## 2013-11-01 NOTE — Telephone Encounter (Signed)
She needs an order to extend his physical therapy for 4 more weeks for twice a week.

## 2013-11-01 NOTE — Telephone Encounter (Signed)
Pt. Needs an order to extend therapy for 4 more weeks

## 2013-11-05 ENCOUNTER — Inpatient Hospital Stay (HOSPITAL_COMMUNITY): Admission: RE | Admit: 2013-11-05 | Payer: Medicare Other | Source: Ambulatory Visit

## 2013-11-06 ENCOUNTER — Encounter: Payer: Self-pay | Admitting: Cardiovascular Disease

## 2013-11-06 ENCOUNTER — Telehealth: Payer: Self-pay | Admitting: Cardiovascular Disease

## 2013-11-06 ENCOUNTER — Ambulatory Visit (INDEPENDENT_AMBULATORY_CARE_PROVIDER_SITE_OTHER): Payer: Medicare Other | Admitting: Cardiovascular Disease

## 2013-11-06 VITALS — BP 123/88 | HR 140 | Ht 71.0 in | Wt 201.0 lb

## 2013-11-06 DIAGNOSIS — N189 Chronic kidney disease, unspecified: Secondary | ICD-10-CM

## 2013-11-06 DIAGNOSIS — I482 Chronic atrial fibrillation, unspecified: Secondary | ICD-10-CM

## 2013-11-06 DIAGNOSIS — I5043 Acute on chronic combined systolic (congestive) and diastolic (congestive) heart failure: Secondary | ICD-10-CM

## 2013-11-06 DIAGNOSIS — F17200 Nicotine dependence, unspecified, uncomplicated: Secondary | ICD-10-CM

## 2013-11-06 DIAGNOSIS — Z720 Tobacco use: Secondary | ICD-10-CM

## 2013-11-06 DIAGNOSIS — N184 Chronic kidney disease, stage 4 (severe): Secondary | ICD-10-CM

## 2013-11-06 DIAGNOSIS — R0602 Shortness of breath: Secondary | ICD-10-CM

## 2013-11-06 DIAGNOSIS — J439 Emphysema, unspecified: Secondary | ICD-10-CM

## 2013-11-06 DIAGNOSIS — I4891 Unspecified atrial fibrillation: Secondary | ICD-10-CM

## 2013-11-06 DIAGNOSIS — R609 Edema, unspecified: Secondary | ICD-10-CM

## 2013-11-06 LAB — COMPREHENSIVE METABOLIC PANEL
ALT: 11 U/L (ref 0–53)
AST: 17 U/L (ref 0–37)
Albumin: 3.7 g/dL (ref 3.5–5.2)
Alkaline Phosphatase: 73 U/L (ref 39–117)
BILIRUBIN TOTAL: 0.5 mg/dL (ref 0.2–1.2)
BUN: 48 mg/dL — ABNORMAL HIGH (ref 6–23)
CO2: 27 mEq/L (ref 19–32)
CREATININE: 2.55 mg/dL — AB (ref 0.50–1.35)
Calcium: 8.7 mg/dL (ref 8.4–10.5)
Chloride: 108 mEq/L (ref 96–112)
GLUCOSE: 88 mg/dL (ref 70–99)
Potassium: 5.4 mEq/L — ABNORMAL HIGH (ref 3.5–5.3)
Sodium: 141 mEq/L (ref 135–145)
TOTAL PROTEIN: 6.3 g/dL (ref 6.0–8.3)

## 2013-11-06 LAB — CBC
HCT: 33.4 % — ABNORMAL LOW (ref 39.0–52.0)
HEMOGLOBIN: 10.6 g/dL — AB (ref 13.0–17.0)
MCH: 27.8 pg (ref 26.0–34.0)
MCHC: 31.7 g/dL (ref 30.0–36.0)
MCV: 87.7 fL (ref 78.0–100.0)
Platelets: 137 10*3/uL — ABNORMAL LOW (ref 150–400)
RBC: 3.81 MIL/uL — AB (ref 4.22–5.81)
RDW: 15.8 % — ABNORMAL HIGH (ref 11.5–15.5)
WBC: 6.1 10*3/uL (ref 4.0–10.5)

## 2013-11-06 MED ORDER — DIGOXIN 125 MCG PO TABS: ORAL_TABLET | ORAL | Status: AC

## 2013-11-06 MED ORDER — FUROSEMIDE 80 MG PO TABS: 80.0000 mg | ORAL_TABLET | Freq: Every day | ORAL | Status: AC

## 2013-11-06 MED ORDER — DILTIAZEM HCL ER COATED BEADS 240 MG PO CP24: 240.0000 mg | ORAL_CAPSULE | Freq: Every day | ORAL | Status: AC

## 2013-11-06 NOTE — Telephone Encounter (Addendum)
Pt's wife is calling stating that [name] is having extreme swelling in his legs for the past 2 wks and has not been able to walk. She feels that he needs to be seen . Please call

## 2013-11-06 NOTE — Telephone Encounter (Signed)
Wife called:  Increased bilateral peripheral edema over the past 2 weeks.  Home Health nurse visited yesterday advised he be assessed by his doctor (Dr. Claiborne Billings).  Patient having difficulty getting around due to swelling.  Scheduled with Dr. Claiborne Billings today at 2:15pm.

## 2013-11-06 NOTE — Progress Notes (Signed)
Patient ID: [name], male   DOB: 16-Jul-1940, 73 y.o.   MRN: [id number]       HPI: [name] is a 73 y.o. male who presents for followup evaluation of [name] recent hospitalization with acute on chronic CHF and rapid atrial fibrillation.  [name] has established coronary artery disease and underwent stenting of [name] RCA in 2003 by Dr. Glade Lloyd. I first saw him in 2011 when [name] had symptoms suggestive of CAD progression.  Repeat catheterization  showed total occlusion of the RCA within the previously stented segment.  I was able to successfully reopen this vessel and also performed PTCA distal to [name] previously placed stents. [name] did have concomitant CAD of the LAD and circumflex vessels. [name] has a documented infrarenal abdominal aortic aneurysm. [name] has ongoing tobacco use with documented COPD/emphysema, history of hypertension hyperlipidemia and obstructive sleep apnea. [name] was found to be in atrial fibrillation in March 2014 at that time was started on Eliquis anticoagulation and rate control medications. A cardioversion was performed on 06/07/2012 with restoration of sinus rhythm. Unfortunately, [name] did not hold and develop recurrent AF. Due to [name] COPD I was concerned about initiating amiodarone and  then started him on Multaq 400 mg twice a day and further titrated [name] Bystolic.  [name] was set up for a an attempt at another cardioversion in July but prior to this was hospitalized with a COPD exacerbation and [name] elective cardioversion was canceled. However, [name] then presented to the pulmonology office for followup evaluation and was noted to be in atrial fibrillation with rapid ventricular response which then led to her another [facility] hospitalization. At that time [name] was then started on IV Cardizem for rate control. While on Multaq, another attempt at cardioversion was done by Dr. Debara Pickett which was unsuccessful and the thought process was for him to stay in permanent atrial fibrillation. [name] also has  obstructive sleep apnea on CPAP.   In January 2015 [name] was bitten by [name] cat. This resulted in significant infection and development of necrotic tissue. [name] eliquis was held, [name] was treated with antibiotics and [name] underwent surgical debridement on 8 x 6 cm superficial necrotic wound in [name] left lower leg leg done by Dr. Serita Grammes. [name] required drainage of this and wounds care center evaluations. [name] does note leg swelling. [name] is back on eliquis anticoagulation. [name] denies recent bleeding. [name] denies recent anginal symptoms. Unfortunately [name] is still smoking cigarettes.  [name] presented to the office one month ago and at that time [name] atrial fibrillation rate was rapid in the 120s. [name] saw Kerin Ransom. There was some confusion in the medications at the patient's were taking. [name] did have edema bilaterally. Creatinine was 1.75.  [name] was admitted to the hospital on 09/27/2013 with acute on chronic systolic and diastolic CHF in the setting of rapid atrial fibrillation.  At that time.  [name] was sent from the wound care center.  An echo Doppler study showed a slight decrease in [name] ejection fraction to 45%.  In the hospital, [name] had significant diuresis.  [name] has stage IV chronic kidney disease with GFR of 25% range.  [name] creatinine is increased to 2.6 to, but did improve prior to discharge.  The patient is now on home O2 for severe COPD.  Unfortunately, [name] still smokes when [name] does not use [name] supplemental oxygen.  [name] was discharged from the hospital on 10/05/2013.  [name] saw Kerin Ransom, in the office on September 22.  At that time.  [name] had 2+ edema.  [name] presents now for evaluation.  Past Medical History  Diagnosis Date  . COPD (chronic obstructive pulmonary disease)   . Active smoker   . Obesity   . Coronary artery disease   . Benign neoplasm of colon   . Atrial fibrillation   . Pyelonephritis, unspecified   . Other and unspecified hyperlipidemia   . Personal history of peptic ulcer disease   . Elevated prostate  specific antigen (PSA)   . Diaphragmatic hernia without mention of obstruction or gangrene   . Esophageal reflux   . Tobacco use disorder   . Unspecified essential hypertension   . Peripheral vascular disease   . Sleep apnea     managed by Dr. Corky Downs  . Dysrhythmia     A. Fib  . Shortness of breath   . Chronic systolic CHF (congestive heart failure)     a.  09/30/2013; EF 45-50%: Mild concentric hypertrophy. No RWMAs. Compared to the prior study from 5/63/8756 the LV systolic function is now mildly impaired LVEF 45-50%, previously 60-65%. Right sided chanbers are dilated with mod dec RV function. Mod pulmonary HTN.  Marland Kitchen Chronic respiratory failure 10/02/2013    Past Surgical History  Procedure Laterality Date  . Coronary artery stent  2003  . Cardioversion N/A 06/07/2012    Procedure: CARDIOVERSION;  Surgeon: Troy Sine, MD;  Location: [facility];  Service: Cardiovascular;  Laterality: N/A;  . Cardioversion N/A 08/17/2012    Procedure: CARDIOVERSION;  Surgeon: Pixie Casino, MD;  Location: [facility] ENDOSCOPY;  Service: Cardiovascular;  Laterality: N/A;  . Coronary angioplasty    . Appendectomy    . I&d extremity Left 03/05/2013    Procedure: IRRIGATION AND DEBRIDEMENT EXTREMITY; left leg  Surgeon: Rolm Bookbinder, MD;  Location: [facility];  Service: General;  Laterality: Left;  . Pars plana vitrectomy Right 07/18/2013    Procedure: PARS PLANA VITRECTOMY 25 GAUGE FOR ENDOPHTHALMITIS WITH INTRAVITREAL ANTIBIOTICS;  Surgeon: Hurman Horn, MD;  Location: [facility];  Service: Ophthalmology;  Laterality: Right;  . Tonsillectomy      Allergies  Allergen Reactions  . Ranitidine Hcl      throat swelling, difficulty breathing  . Beta Adrenergic Blockers Other (See Comments)    Caused sexual dysfunction  . Ramipril Swelling     throat swelling  . Sulfonamide Derivatives Other (See Comments)     childhood reation of hematuria  . Ticlopidine Hcl Swelling    Leg swelling    Current Outpatient  Prescriptions  Medication Sig Dispense Refill  . apixaban (ELIQUIS) 5 MG TABS tablet Take 5 mg by mouth 2 (two) times daily.       Marland Kitchen aspirin 81 MG tablet Take 81 mg by mouth daily.        Marland Kitchen atorvastatin (LIPITOR) 40 MG tablet Take 40 mg by mouth at bedtime.       Marland Kitchen diltiazem (CARDIZEM CD) 180 MG 24 hr capsule Take 1 capsule (180 mg total) by mouth daily.  30 capsule  3  . furosemide (LASIX) 40 MG tablet Take 40 mg by mouth daily.      . hydrALAZINE (APRESOLINE) 25 MG tablet Take 1 tablet (25 mg total) by mouth every 8 (eight) hours.  90 tablet  11  . isosorbide mononitrate (IMDUR) 30 MG 24 hr tablet Take 30 mg by mouth. Take 1/2 tablet daily      . Multiple Vitamins-Minerals (MULTIVITAMIN WITH MINERALS) tablet Take 1 tablet by  mouth daily.        . nebivolol (BYSTOLIC) 10 MG tablet Take 2 tablets (20 mg) every morning and 1 tablet (10 mg) every night      . pantoprazole (PROTONIX) 40 MG tablet Take 40 mg by mouth daily.       Marland Kitchen tiotropium (SPIRIVA) 18 MCG inhalation capsule Place 18 mcg into inhaler and inhale 2 (two) times daily.        No current facility-administered medications for this visit.    History   Social History  . Marital Status: Married    Spouse Name: N/A    Number of Children: 2  . Years of Education: N/A   Occupational History  . retired Designer, industrial/product   Social History Main Topics  . Smoking status: Current Every Day Smoker -- 1.00 packs/day for 55 years    Types: Cigarettes  . Smokeless tobacco: Never Used     Comment: started at age 55.  prev was 2 ppd.   . Alcohol Use: 1.8 oz/week    3 Shots of liquor per week  . Drug Use: No  . Sexual Activity: Not on file   Other Topics Concern  . Not on file   Social History Narrative  . No narrative on file   ROS General: Negative; No fevers, chills, or night sweats;  HEENT: Negative; No changes in vision or hearing, sinus congestion, difficulty swallowing Pulmonary: Positive for shortness of  breath and occasional wheezing; no hemoptysis Cardiovascular: Negative; No chest pain, presyncope, syncope, palpitations GI: Negative; No nausea, vomiting, diarrhea, or abdominal pain GU: Negative; No dysuria, hematuria, or difficulty voiding Musculoskeletal: Negative; no myalgias, joint pain, or weakness Hematologic/Oncology: Negative; no easy bruising, bleeding Endocrine: Negative; no heat/cold intolerance; no diabetes Neuro: Negative; no changes in balance, headaches Skin: Still with skin lesion with mild erythema from [name] cat bite in [name] lower extremity;  Psychiatric: Negative; No behavioral problems, depression Sleep: Positive for 5 to sleep apnea on CPAP therapy with percent compliance daytime sleepiness, hypersomnolence, bruxism, restless legs, hypnogognic hallucinations, no cataplexy Other comprehensive 14 point system review is negative.   PE BP 123/88  Pulse 140  Ht 5\' 11"  (1.803 m)  Wt 201 lb (91.173 kg)  BMI 28.05 kg/m2  General: Alert, oriented, no distress.  Skin: normal turgor, no rashes HEENT: Normocephalic, atraumatic. Pupils round and reactive; sclera anicteric;no lid lag. No central Nose without nasal septal hypertrophy Mouth/Parynx benign; Mallinpatti scale 3 Neck: No JVD, no carotid bruits with normal carotid upstroke Lungs: Diffusely decreased breath sounds due to long-standing tobacco use.; no wheezing or rales Heart: Irregularly irregular with a ventricular rate now 120 to 1:30 beats per minute beats, s1 s2 normal 1/6 systolic murmur; no diastolic murmur. No S3 or S4 gallop. Abdomen: soft, nontender; no hepatosplenomehaly, BS+; abdominal aorta nontender and not dilated by palpation. Pulses 2+ Extremities: At least one plus pedal edema.; no clubbing cyanosis, Homan's sign negative  Neurologic: grossly nonfocal Psychologic: Normal affect and mood   ECG (independently read by me a (atrial fibrillation with a rapid ventricular response at approximately 120 to  1:30 beats per minute, nonspecific ST-T changes.  Right bundle branch block.  ECG (independently read by me): Atrial fibrillation with a controlled ventricular response in the 90s. Nonspecific ST changes.  LABS:  BMET    Component Value Date/Time   NA 139 10/05/2013 1142   K 5.0 10/05/2013 1142   CL 100 10/05/2013 1142   CO2 28  10/05/2013 1142   GLUCOSE 93 10/05/2013 1142   BUN 48* 10/05/2013 1142   CREATININE 2.43* 10/05/2013 1142   CALCIUM 8.7 10/05/2013 1142   GFRNONAA 25* 10/05/2013 1142   GFRAA 29* 10/05/2013 1142     Hepatic Function Panel     Component Value Date/Time   PROT 7.2 03/04/2013 1943   ALBUMIN 3.5 03/04/2013 1943   AST 17 03/04/2013 1943   ALT 12 03/04/2013 1943   ALKPHOS 90 03/04/2013 1943   BILITOT 0.4 03/04/2013 1943     CBC    Component Value Date/Time   WBC 6.1 10/04/2013 0336   RBC 3.56* 10/04/2013 0336   HGB 10.4* 10/04/2013 0336   HCT 32.2* 10/04/2013 0336   PLT 106* 10/04/2013 0336   MCV 90.4 10/04/2013 0336   MCH 29.2 10/04/2013 0336   MCHC 32.3 10/04/2013 0336   RDW 14.6 10/04/2013 0336   LYMPHSABS 1.0 09/27/2013 1400   MONOABS 0.7 09/27/2013 1400   EOSABS 0.1 09/27/2013 1400   BASOSABS 0.0 09/27/2013 1400     BNP    Component Value Date/Time   PROBNP 17996.0* 10/04/2013 0336    Lipid Panel  No results found for this basename: chol,  trig,  hdl,  cholhdl,  vldl,  ldlcalc     RADIOLOGY: Dg Chest Port 1 View  08/16/2012   *RADIOLOGY REPORT*  Clinical Data: Short of breath  PORTABLE CHEST - 1 VIEW  Comparison: 08/03/2012  Findings: COPD with hyperinflation of the lungs.  Negative for pneumonia.  Negative for heart failure or effusion.  IMPRESSION: No acute abnormality.  COPD.   Original Report Authenticated By: Carl Best, M.D.      ASSESSMENT AND PLAN: [name] is a 73 year old gentleman with established coronary artery disease who now has permanent atrial fibrillation on eliquis anticoagulation therapy. [name]  developed a CAT bite leading to infection in development of  necrotic tissue requiring recent debridement. [name] is now back on [name] anticoagulation therapy.  [name] was recently admitted with a cath with a rapid ventricular response with acute on chronic systolic and diastolic CHF and progressive renal insufficiency with stage IV renal insufficiency.  [name] did diuresis significantly.  Presently, [name] ventricular rate is increased.  [name] does have edema.  I'm increasing [name] Lasix to 80 mg, and we'll further titrate [name] diltiazem CD to 240 mg from 180 mg.  I am also adding Lanoxin 0.25 mg today and then 0.125 mg daily.  Unfortunately, [name] continues to smoke cigarettes when [name] is not using [name] home oxygen.  I discussed the importance of complete smoking cessation.  Laboratory will be checked today consisting of a BNP, CMP, CBC, and TSH level.  I also recommended support stockings.  I will have him see a PA in one week for followup evaluation.  I will see him in 3-4 weeks for followup evaluation with me  Troy Sine, MD, [facility]  11/06/2013 3:21 PM

## 2013-11-06 NOTE — Patient Instructions (Signed)
Your physician has recommended you make the following change in your medication: start new prescription for digoxin. Increase the diltiazem to 240 mg. Increase the furosemide to 80 mg daily.   Your physician recommends that you return for lab work in: today.   Your physician recommends that you schedule a follow-up appointment in: 1 week with a PA and 4 weeks with Dr. Claiborne Billings.

## 2013-11-07 ENCOUNTER — Telehealth: Payer: Self-pay | Admitting: *Deleted

## 2013-11-07 LAB — TSH: TSH: 1.47 u[IU]/mL (ref 0.350–4.500)

## 2013-11-07 LAB — BRAIN NATRIURETIC PEPTIDE: BRAIN NATRIURETIC PEPTIDE: 1729.5 pg/mL — AB (ref 0.0–100.0)

## 2013-11-07 NOTE — Telephone Encounter (Signed)
OK to extend

## 2013-11-07 NOTE — Telephone Encounter (Signed)
Maria informed via voice mail x 2 that Dr. Claiborne Billings said it was ok to extend therapy 4 more weeks

## 2013-11-07 NOTE — Telephone Encounter (Signed)
Message copied by Lauralee Evener on Thu Nov 07, 2013  3:20 PM ------      Message from: Shelva Majestic A      Created: Thu Nov 07, 2013  2:07 PM       Labs stable; decrease K in diet, Cr not sig changes; BNP still increased ------

## 2013-11-07 NOTE — Telephone Encounter (Signed)
Left message labs stable. Decrease the potassium in diet. Keep appointment with Kerin Ransom next week. Call back if questions or concerns.

## 2013-11-08 ENCOUNTER — Encounter: Payer: Self-pay | Admitting: Cardiovascular Disease

## 2013-11-12 ENCOUNTER — Other Ambulatory Visit: Payer: Self-pay | Admitting: Cardiovascular Disease

## 2013-11-18 ENCOUNTER — Telehealth: Payer: Self-pay | Admitting: Cardiovascular Disease

## 2013-11-18 NOTE — Telephone Encounter (Signed)
Returned call to patient's wife/patient. She reports for last 2 nights he can't lay down to sleep. Patient states he "hasn't been good at all" and when he goes to bed he tosses and turns and keeps his wife up all night and that where they did work on his leg it has been bothering him. Wife states he can normally do normal activities but has been short of breath with daily activities. Patient DOES NOT have any new/increased swelling (wife states it is about the same). Patient denies chest discomfort. Patient's wife states that she thinks weights are stable >> patient has scales through home health or insurance company that get monitored and wife states they would have been notified by the nurse if his weights were increasing. Patient has been taking medications as prescribed/changed by Dr. Claiborne Billings at last [place] 10/7. Lab work results also given to patient/wife while on phone.   Patient is scheduled to see Lurena Joiner, [place] 10/22 @ 9:30am and then has f/up with Dr. Claiborne Billings on 11/3 @ 9:15am  Patient/wife will notify office if symptoms worsen between now and Thursday.

## 2013-11-18 NOTE — Telephone Encounter (Signed)
Please call,pt was up all last night. He could not breathe,COPD is getting worse in the last 2 days.

## 2013-11-21 ENCOUNTER — Ambulatory Visit (INDEPENDENT_AMBULATORY_CARE_PROVIDER_SITE_OTHER): Payer: Medicare Other | Admitting: Cardiology

## 2013-11-21 ENCOUNTER — Encounter: Payer: Self-pay | Admitting: Cardiology

## 2013-11-21 VITALS — BP 130/64 | HR 94 | Ht 71.0 in | Wt 186.8 lb

## 2013-11-21 DIAGNOSIS — Z79899 Other long term (current) drug therapy: Secondary | ICD-10-CM

## 2013-11-21 DIAGNOSIS — I4891 Unspecified atrial fibrillation: Secondary | ICD-10-CM

## 2013-11-21 DIAGNOSIS — I5043 Acute on chronic combined systolic (congestive) and diastolic (congestive) heart failure: Secondary | ICD-10-CM

## 2013-11-21 MED ORDER — METOLAZONE 2.5 MG PO TABS: ORAL_TABLET | ORAL | Status: AC

## 2013-11-21 NOTE — Assessment & Plan Note (Signed)
He is still volume overloaded, his wgt is up 8 lbs despite increased PO Lasix.

## 2013-11-21 NOTE — Patient Instructions (Signed)
Your physician recommends that you schedule a follow-up appointment in: Keep Appointment with Dr Claiborne Billings on 12/03/2013  Your physician has recommended you make the following change in your medication: Start Metolazone 2.5 mg every Monday, Wednesday and Friday 30 mins before Lasix  Your physician recommends that you return for lab work in: 1 Weeks, BNP,BMP

## 2013-11-21 NOTE — Assessment & Plan Note (Signed)
Last SCr 2.55

## 2013-11-21 NOTE — Progress Notes (Signed)
11/21/2013 [name]   October 29, 1940  [id number]  Primary Physicia Horatio Pel, MD Primary Cardiologist: Dr Claiborne Billings  HPI:  See Dr Evette Georges OV note from 11/06/13. 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 Glade Lloyd. Repeat catheterization in 2011 showed total occlusion of the RCA within the previously stented segment. Dr Claiborne Billings successfully reopened this vessel and also performed PTCA distal to his previously placed stents. He did have concomitant CAD of the LAD and circumflex vessels. 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 He was found to be in atrial fibrillation in March 2014 and at that time was started on Eliquis anticoagulation and rate control medications. He failed DCCV and Multaq and is in chronic atrial fibrillation. He suffered a cat bite in Jan 2015 and has problems with a chronic leg wound.              Dr Claiborne Billings saw him 11/08/13 and felt he was volume overloaded and his AF was uncontrolled. His Diltiazem and Lasix were increased and Lanoxin added. He is her now for follow up. He has noticed no significant change. He has chronic DOA and LE edema. He is not really clear on his medications. His wgt is up about 6 lbs from his LOV. His HR is 92.    Current Outpatient Prescriptions  Medication Sig Dispense Refill  . apixaban (ELIQUIS) 5 MG TABS tablet Take 5 mg by mouth 2 (two) times daily.       Marland Kitchen aspirin 81 MG tablet Take 81 mg by mouth daily.        Marland Kitchen atorvastatin (LIPITOR) 40 MG tablet TAKE 1 TABLET BY MOUTH DAILY  30 tablet  6  . digoxin (LANOXIN) 0.125 MG tablet Take 2 tablets today, then 1 tablet daily thereafter.  30 tablet  6  . diltiazem (CARDIZEM CD) 240 MG 24 hr capsule Take 1 capsule (240 mg total) by mouth daily.  90 capsule  3  . furosemide (LASIX) 80 MG tablet Take 1 tablet (80 mg  total) by mouth daily.  30 tablet  6  . hydrALAZINE (APRESOLINE) 25 MG tablet Take 1 tablet (25 mg total) by mouth every 8 (eight) hours.  90 tablet  11  . isosorbide mononitrate (IMDUR) 30 MG 24 hr tablet Take 30 mg by mouth. Take 1/2 tablet daily      . Multiple Vitamins-Minerals (MULTIVITAMIN WITH MINERALS) tablet Take 1 tablet by mouth daily.        . nebivolol (BYSTOLIC) 10 MG tablet Take 2 tablets (20 mg) every morning and 1 tablet (10 mg) every night      . pantoprazole (PROTONIX) 40 MG tablet Take 40 mg by mouth daily.       Marland Kitchen tiotropium (SPIRIVA) 18 MCG inhalation capsule Place 18 mcg into inhaler and inhale 2 (two) times daily.        No current facility-administered medications for this visit.    Allergies  Allergen Reactions  . Ranitidine Hcl      throat swelling, difficulty breathing  . Beta Adrenergic Blockers Other (See Comments)    Caused sexual dysfunction  . Ramipril Swelling     throat swelling  . Sulfonamide Derivatives Other (See Comments)     childhood reation of hematuria  . Ticlopidine Hcl Swelling    Leg swelling    History   Social  History  . Marital Status: Married    Spouse Name: N/A    Number of Children: 2  . Years of Education: N/A   Occupational History  . retired Designer, industrial/product   Social History Main Topics  . Smoking status: Current Every Day Smoker -- 1.00 packs/day for 55 years    Types: Cigarettes  . Smokeless tobacco: Never Used     Comment: started at age 19.  prev was 2 ppd.   . Alcohol Use: 1.8 oz/week    3 Shots of liquor per week  . Drug Use: No  . Sexual Activity: Not on file   Other Topics Concern  . Not on file   Social History Narrative  . No narrative on file     Review of Systems: General: negative for chills, fever, night sweats or weight changes.  Cardiovascular: negative for chest pain, dyspnea on exertion, edema, orthopnea, palpitations, paroxysmal nocturnal dyspnea or shortness of  breath Dermatological: negative for rash Respiratory: negative for cough or wheezing Urologic: negative for hematuria Abdominal: negative for nausea, vomiting, diarrhea, bright red blood per rectum, melena, or hematemesis Neurologic: negative for visual changes, syncope, or dizziness All other systems reviewed and are otherwise negative except as noted above.    Blood pressure 130/64, pulse 94, height 5\' 11"  (1.803 m), weight 186 lb 12.8 oz (84.732 kg).  General appearance: alert, cooperative, no distress and chronically ill appearing Neck: JVP to angle of jaw Lungs: decreased Rt base Heart: irregularly irregular rhythm Extremities: 2+ bilat LE edema with healing Lt LE wound  EKG AF RBBB HR 94  ASSESSMENT AND PLAN:   Acute on chronic combined systolic and diastolic congestive heart failure He is still volume overloaded, his wgt is up 8 lbs despite increased PO Lasix.   Permanent atrial fibrillation Rate is better with addition of Lanoxin and increased Diltiazem.   Chronic anticoagulation - Eliquis On 5 mg BID  Chronic renal insufficiency, stage IV (severe) Last SCr 2.55  Chronic respiratory failure COPD, home O2 prn   PLAN  Add Zaroxolyn 2.5 mg MWF. Check BNP, BMP in one week. Keep f/u with Dr Claiborne Billings 12/03/13.   Travell Desaulniers KPA-C 11/21/2013 9:43 AM

## 2013-11-21 NOTE — Assessment & Plan Note (Signed)
COPD, home O2 prn

## 2013-11-21 NOTE — Assessment & Plan Note (Signed)
Rate is better with addition of Lanoxin and increased Diltiazem.

## 2013-11-21 NOTE — Assessment & Plan Note (Signed)
On 5 mg BID

## 2013-11-29 ENCOUNTER — Telehealth: Payer: Self-pay | Admitting: Cardiovascular Disease

## 2013-11-29 NOTE — Telephone Encounter (Signed)
Verdis Frederickson called in requesting a new order for pt to continue home health physical therapy. Please call  Thanks

## 2013-11-29 NOTE — Telephone Encounter (Signed)
Will forwarded for dr [facility] approval.

## 2013-12-01 NOTE — Telephone Encounter (Signed)
Ok to proceed. 

## 2013-12-02 ENCOUNTER — Telehealth: Payer: Self-pay | Admitting: Cardiovascular Disease

## 2013-12-02 NOTE — Telephone Encounter (Signed)
Arbie Cookey called in wanting to speak with Dr. Evette Georges nurse about some directions on Mr. Pousson' prescription, Hydralazine. Please call  Thanks

## 2013-12-02 NOTE — Telephone Encounter (Signed)
Returned call to [place] with [facility] she wanted to verify directions for hydralazine.Stated pt has not been taking because it said take as needed.Advised needs to take hydralazine 25 mg every 8 hours three times a day.Also wanted to verify bystolic.Advised should take bystolic 10 mg 2 tablets in am and 1 tablet at night.

## 2013-12-02 NOTE — Telephone Encounter (Signed)
Left message on  [name]'s VM per vo from Dr. Claiborne Billings okay to proceed with home PT. Also informed her that I have already faxed back [place] and POC form. If something different needed please fax to me and I will have Dr. Claiborne Billings to sign it.

## 2013-12-03 ENCOUNTER — Ambulatory Visit: Payer: Medicare Other | Admitting: Cardiovascular Disease

## 2013-12-03 ENCOUNTER — Telehealth: Payer: Self-pay | Admitting: Cardiovascular Disease

## 2013-12-03 NOTE — Telephone Encounter (Signed)
Routed to Dr. Arnette Norris to advise

## 2013-12-03 NOTE — Telephone Encounter (Signed)
[name] called in needing a verbal order from Dr. Claiborne Billings stating that it is ok for him to continue to be seen for his CHF. Please call  Thanks

## 2013-12-04 NOTE — Telephone Encounter (Signed)
They will fax over paperwork for Dr. Claiborne Billings to sign off on

## 2013-12-04 NOTE — Telephone Encounter (Signed)
Ok to continue

## 2013-12-04 NOTE — Telephone Encounter (Signed)
Notified Arbie Cookey with [facility] that is OK to continue [facility] care

## 2013-12-09 ENCOUNTER — Ambulatory Visit: Payer: Medicare Other | Admitting: Pulmonary Disease

## 2013-12-09 ENCOUNTER — Ambulatory Visit (INDEPENDENT_AMBULATORY_CARE_PROVIDER_SITE_OTHER): Payer: Medicare Other | Admitting: Adult Health

## 2013-12-09 ENCOUNTER — Telehealth: Payer: Self-pay | Admitting: Cardiovascular Disease

## 2013-12-09 ENCOUNTER — Encounter: Payer: Self-pay | Admitting: Adult Health

## 2013-12-09 VITALS — BP 126/78 | HR 112 | Temp 97.2°F | Ht 71.0 in | Wt 185.0 lb

## 2013-12-09 DIAGNOSIS — J439 Emphysema, unspecified: Secondary | ICD-10-CM

## 2013-12-09 NOTE — Addendum Note (Signed)
Addended by: Parke Poisson E on: 12/09/2013 05:29 PM   Modules accepted: Orders

## 2013-12-09 NOTE — Telephone Encounter (Signed)
Please call,pt is having breathing problems,up all last night. IF Dr Claiborne Billings is not here,what do you recommend for him to do?

## 2013-12-09 NOTE — Telephone Encounter (Signed)
The pt followed up with pulmonary today and is scheduled to see Dr Claiborne Billings next week.

## 2013-12-09 NOTE — Patient Instructions (Signed)
Restart all your meds immediately.  Wear Oxygen 2l/m continuous flow , avoid pulsing device.  Wear CPAP At bedtime   Follow up Cardiology next week as planned  Follow up with Dr. Gwenette Greet next month as planned and As needed   Please contact office for sooner follow up if symptoms do not improve or worsen or seek emergency care  Work on not smoking

## 2013-12-09 NOTE — Progress Notes (Signed)
Ov reviewed, and agree with plan as outlined.  

## 2013-12-09 NOTE — Telephone Encounter (Signed)
I spoke with the pt's wife and she said the pt has had difficulty breathing for the past couple of days.  The pt does have COPD and wears oxygen at home.  O2 sat 89% on 2 L/min per pt's wife. The pt is also having to sleep sitting up due to difficulty breathing.  The pt does have swelling in his feet but this has remained the same since appt with Kerin Ransom PA. The pt is also unsure of his weight and states the nurse keeps track of this.  The pt was scheduled to be seen by the [facility] today but the wife cancelled this visit just prior to my call.  I instructed her to call the nurse back and have them come and evaluate him today.  The wife feels like his SOB is coming from his COPD.  I further advised her to call Pulmonary and make them aware of symptoms.  She agreed with plan.

## 2013-12-09 NOTE — Progress Notes (Signed)
   Subjective:    Patient ID: [name], male    DOB: [birth year], 73 y.o.   MRN: [id number]  HPI 73 yo male with known hx of COPD , ongoing smoker and o2 dependent  Hx of CHF , Atrial Fib   12/09/2013 Acute OV  Complains of increased SOB, tightness, some dry cough x4 days.   Pt is accompanied by his son.  Says his dad has not taken any of his meds for last few days.  Pt says he just did not take it.  We discussed medication compliance and son assures me he will go over and get meds Organized.  Has chronic edema , does not feel it is worse , feels it is actually better.  Has O2 concentrator at home but portable is pulsing device , Sats were low today  On 2l/m continuous flow O2 sat 98%  Denies any purulent sputum, f/c/s, n/v/d, hemoptysis, PND, increased leg swelling.    Review of Systems Constitutional:   No  weight loss, night sweats,  Fevers, chills,  +fatigue, or  lassitude.  HEENT:   No headaches,  Difficulty swallowing,  Tooth/dental problems, or  Sore throat,                No sneezing, itching, ear ache, nasal congestion, post nasal drip,   CV:  No chest pain,  Orthopnea, PND,  , anasarca, dizziness, palpitations, syncope.   GI  No heartburn, indigestion, abdominal pain, nausea, vomiting, diarrhea, change in bowel habits, loss of appetite, bloody stools.   Resp:    No chest wall deformity  Skin: no rash or lesions.  GU: no dysuria, change in color of urine, no urgency or frequency.  No flank pain, no hematuria   MS:  No joint pain or swelling.  No decreased range of motion.  No back pain.  Psych:  No change in mood or affect. No depression or anxiety.  No memory loss.         Objective:   Physical Exam GEN: A/Ox3; pleasant , NAD, elderly   HEENT:  [place]/AT,  EACs-clear, TMs-wnl, NOSE-clear, THROAT-clear, no lesions, no postnasal drip or exudate noted.   NECK:  Supple w/ fair ROM; no JVD; normal carotid impulses w/o bruits; no thyromegaly or nodules  palpated; no lymphadenopathy.  RESP  Decreased BS in bases w/o, wheezes/ rales/ or rhonchi.no accessory muscle use, no dullness to percussion  CARD:  RRR, no m/r/g  , 1+ peripheral edema, pulses intact, no cyanosis or clubbing.  GI:   Soft & nt; nml bowel sounds; no organomegaly or masses detected.  Musco: Warm bil, no deformities or joint swelling noted.   Neuro: alert, no focal deficits noted.    Skin: Warm, no lesions or rashes         Assessment & Plan:

## 2013-12-09 NOTE — Assessment & Plan Note (Signed)
Flare off Spiriva  Also suspect fluid overload off diuretics  Medication compliance discussed Son assures me he will help out with meds   Plan  Restart all your meds immediately.  Wear Oxygen 2l/m continuous flow , avoid pulsing device.  Wear CPAP At bedtime   Follow up Cardiology next week as planned  Follow up with Dr. Gwenette Greet next month as planned and As needed   Please contact office for sooner follow up if symptoms do not improve or worsen or seek emergency care  Work on not smoking

## 2013-12-10 ENCOUNTER — Telehealth: Payer: Self-pay | Admitting: Pulmonary Disease

## 2013-12-10 DIAGNOSIS — J9611 Chronic respiratory failure with hypoxia: Secondary | ICD-10-CM

## 2013-12-10 DIAGNOSIS — J439 Emphysema, unspecified: Secondary | ICD-10-CM

## 2013-12-10 NOTE — Telephone Encounter (Signed)
[place] x 1 for [employer]

## 2013-12-11 ENCOUNTER — Telehealth: Payer: Self-pay | Admitting: Cardiovascular Disease

## 2013-12-11 NOTE — Telephone Encounter (Signed)
lmtcb for [place].

## 2013-12-11 NOTE — Telephone Encounter (Signed)
Spoke with Arbie Cookey with [facility] - she reports patient has wheezing in all 4 lobes on expiration.  He has NOT been taking spiriva and is out - wife was advised to contact pharmacy for refill.  Arbie Cookey reports he is non-compliant with medications - he did not want to take medications today and did not take them until around 10am, after encouragement from [place].   Patient was evaluated by pulmonary on Monday for SOB - was encouraged to restart all meds immediately, wear 2L O2 continuous and CPAP at night. Per Arbie Cookey, the medication resumption is questionable and he was NOT wearing O2 when she arrived. She will reinforce these instructions.   No other issues noted  Patient has OV with Dr. Claiborne Billings 11/18

## 2013-12-12 NOTE — Telephone Encounter (Signed)
Spoke with [name] States that order is not specific enough for the eval of O2  2) pt needs to be evaluated for portable O2 system that will support 2L CONTINUOUS flow, no demand settings. sats were 78% in the office on portable concentrator 2L pulsed dose.      Thanks.    [name] states that he needs order rewritten to give O2 ranges Order replaced.  Nothing further needed.

## 2013-12-13 ENCOUNTER — Telehealth: Payer: Self-pay | Admitting: Pulmonary Disease

## 2013-12-13 MED ORDER — TIOTROPIUM BROMIDE MONOHYDRATE 18 MCG IN CAPS: 18.0000 ug | ORAL_CAPSULE | Freq: Every day | RESPIRATORY_TRACT | Status: AC

## 2013-12-13 NOTE — Telephone Encounter (Signed)
Son is asking for spiriva refill.  [employer]  [phone]

## 2013-12-13 NOTE — Telephone Encounter (Signed)
Called and spoke to pt's son, Luci Bank stated he called Hospice and has a scheduled evaluation for a nurse to come to pt's home. Pt confirmed him wanting Hospice d/t his breathing difficulty. [name] requesting recs or insight about Hospice referral. Pt last seen by [facility] on 09/11/13. Pt seen by TP on 12/09/13.  [place] please advise.

## 2013-12-13 NOTE — Telephone Encounter (Signed)
Pt's son returned call. [name] requesting a refill of spiriva. Rx sent to preferred pharmacy. Pt's son aware the hospice message was sent to [facility] and will call when [facility] responds.

## 2013-12-13 NOTE — Telephone Encounter (Signed)
lmtcb for pt's son.  

## 2013-12-13 NOTE — Telephone Encounter (Signed)
Called and spoke to pt's son and informed pt of the recs per [facility]. Pt's son verbalized understanding and denied any further questions or concerns at this time.

## 2013-12-13 NOTE — Telephone Encounter (Signed)
The pt has severe lung disease, and I think hospice is appropriate if the pt would like to focus more on comfort.  Let son know that his breathing will get better if he stops smoking and stays on oxygen more consistently.

## 2013-12-15 NOTE — Telephone Encounter (Signed)
Needs to be compliant with meds

## 2013-12-16 ENCOUNTER — Telehealth: Payer: Self-pay | Admitting: Pulmonary Disease

## 2013-12-16 NOTE — Telephone Encounter (Signed)
Noted  

## 2013-12-16 NOTE — Telephone Encounter (Signed)
Mandy received order from TP on 12/12/13 to eval pt for portable O2. She reports when they contacted pt, the family advised pt now under hospice and no longer can ambulate. FYI for our records.

## 2013-12-18 ENCOUNTER — Ambulatory Visit: Payer: Medicare Other | Admitting: Cardiovascular Disease

## 2013-12-31 DEATH — deceased

## 2014-01-14 ENCOUNTER — Ambulatory Visit: Payer: Medicare Other | Admitting: Pulmonary Disease

## 2014-01-23 ENCOUNTER — Encounter: Payer: Self-pay | Admitting: Pulmonary Disease

## 2014-02-10 ENCOUNTER — Telehealth: Payer: Self-pay | Admitting: Cardiovascular Disease

## 2014-02-10 NOTE — Telephone Encounter (Signed)
She is faxing order right now,must have these back today please.This is her second time sending them.

## 2014-07-28 ENCOUNTER — Other Ambulatory Visit: Payer: Self-pay

## 2014-12-14 IMAGING — CR DG CHEST 2V
2 series · 2 of 2 positions shown · non-contrast
Comparison: 09/28/2013

CLINICAL DATA: Congestive heart failure, atrial fibrillation

EXAM:
CHEST  2 VIEW

[w chest pa]
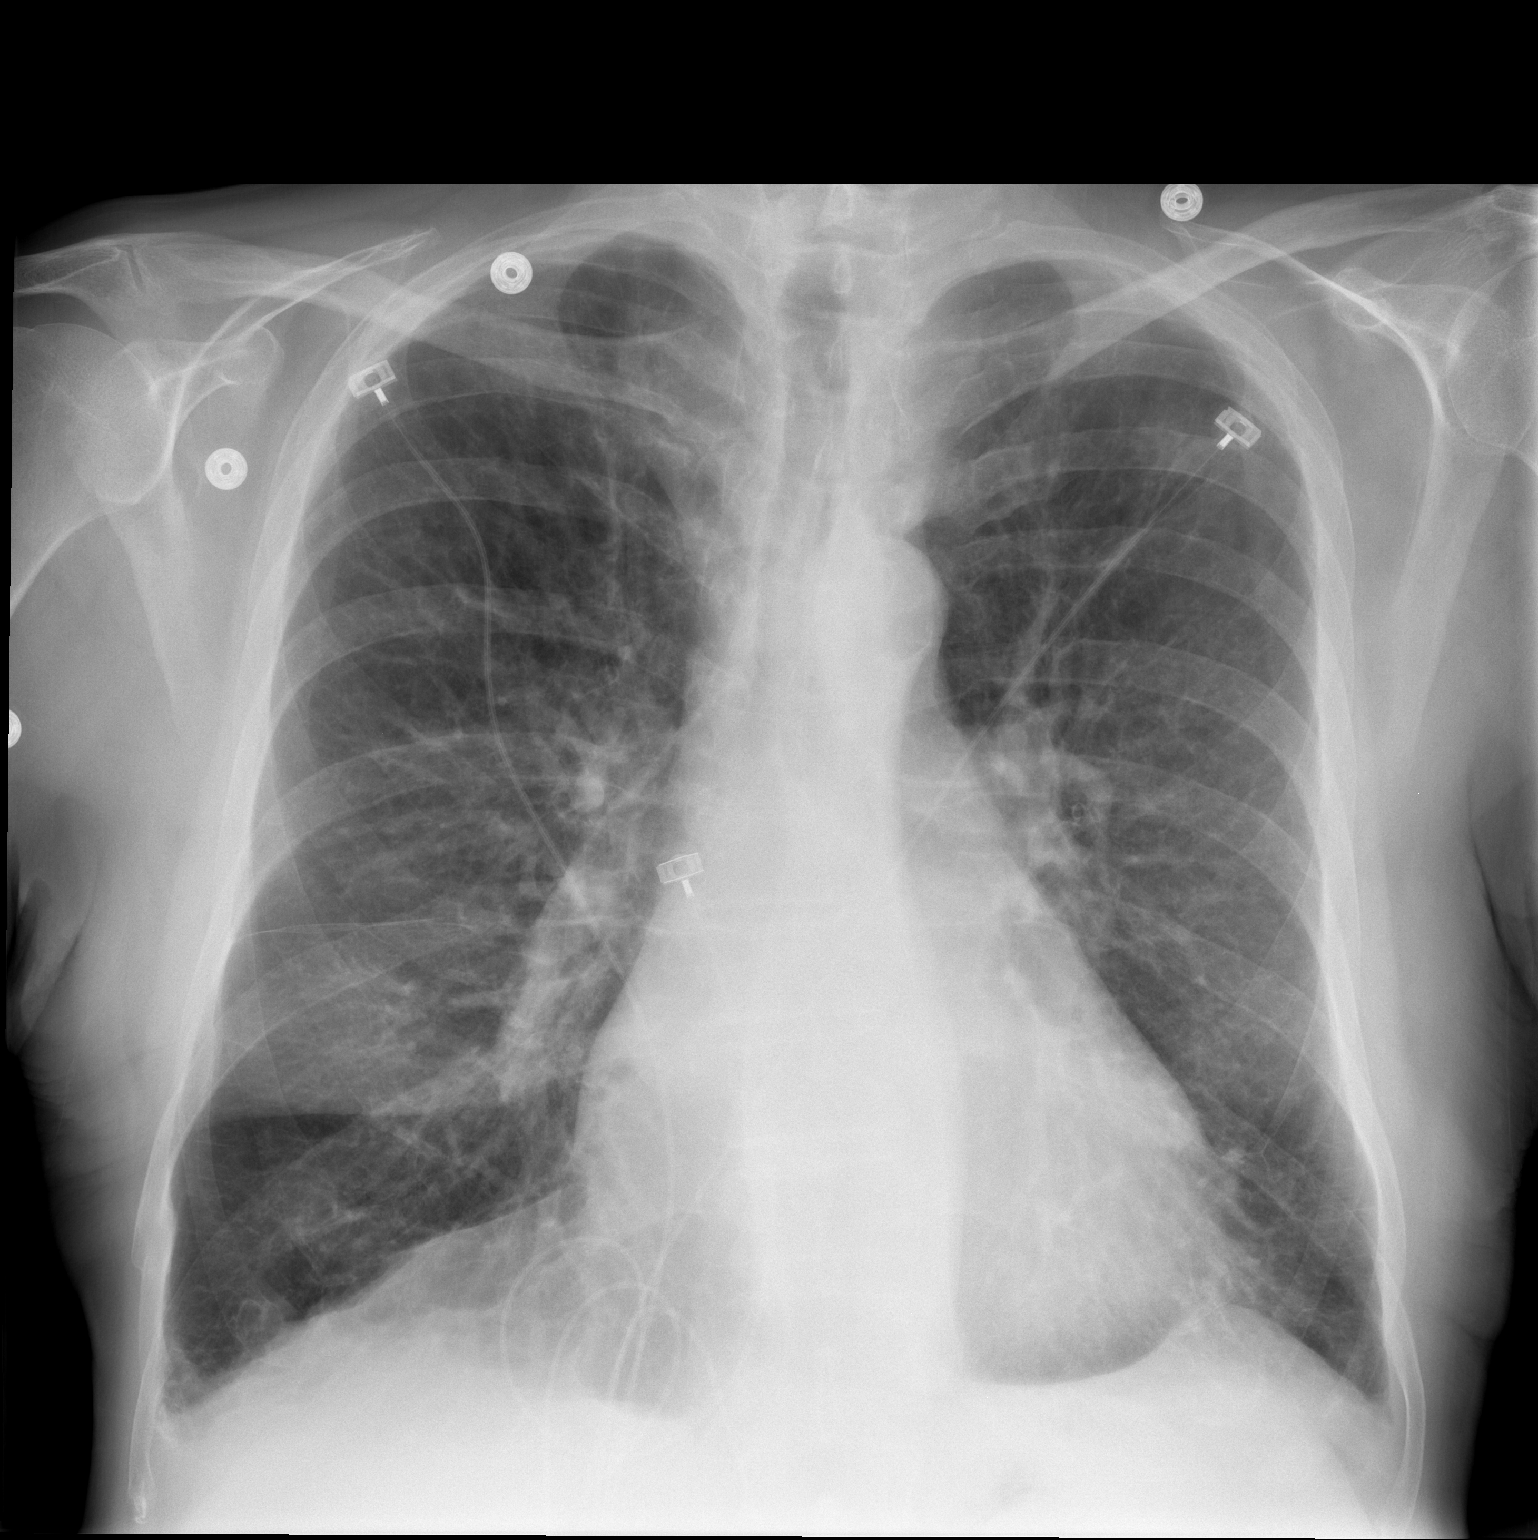

[w chest lat]
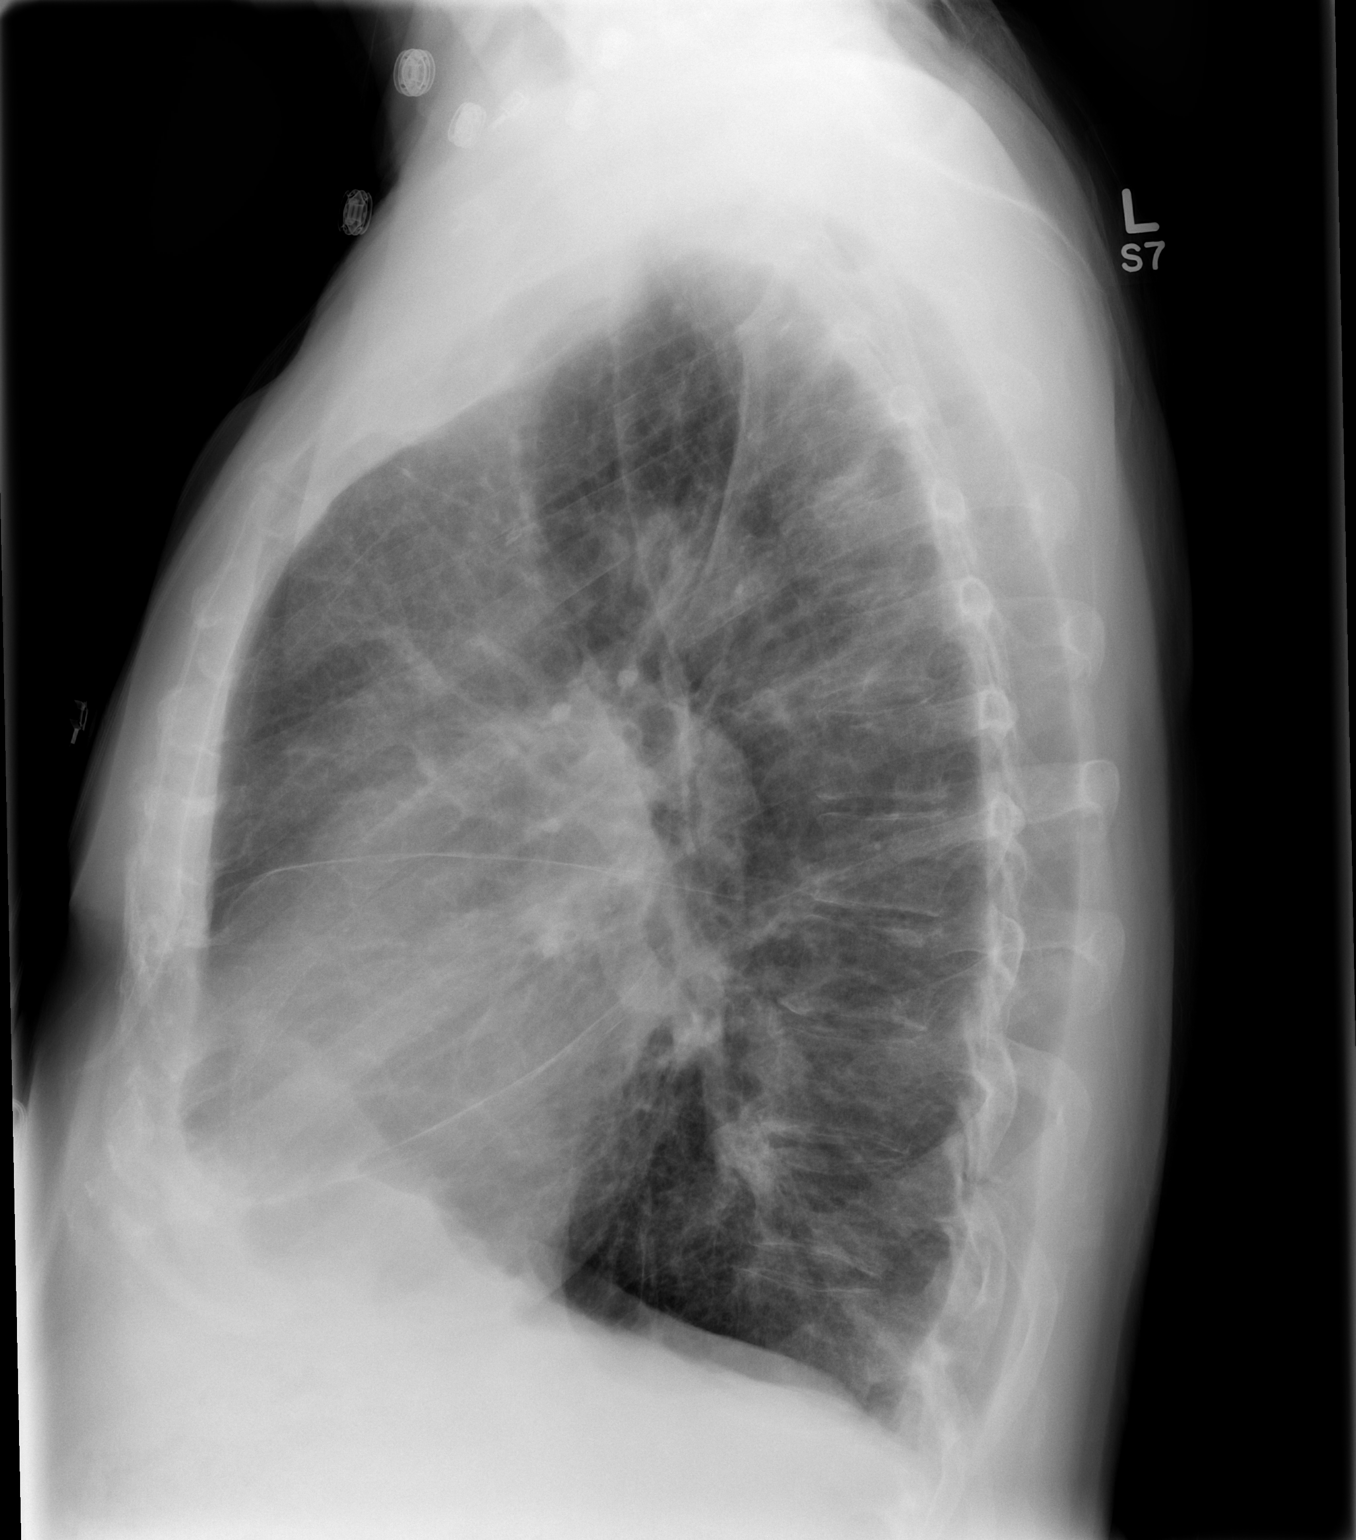

[2 of 2 positions shown; findings below may reference images not displayed]

FINDINGS: Borderline cardiomegaly. No acute infiltrate or pleural effusion. No
pulmonary edema. Degenerative changes thoracic spine. Mild
hyperinflation.
IMPRESSION: Mild hyperinflation. No acute infiltrate or convincing pulmonary
edema

## 2015-01-12 ENCOUNTER — Telehealth: Payer: Self-pay | Admitting: Internal Medicine

## 2021-09-13 NOTE — Telephone Encounter (Signed)
error
